# Patient Record
Sex: Male | Born: 1940
Health system: Southern US, Community
[De-identification: ages and names within clinical notes are randomized; demographics above are authoritative.]

## PROBLEM LIST (undated history)

## (undated) DIAGNOSIS — C801 Malignant (primary) neoplasm, unspecified: Secondary | ICD-10-CM

## (undated) DIAGNOSIS — Z9089 Acquired absence of other organs: Secondary | ICD-10-CM

## (undated) DIAGNOSIS — N400 Enlarged prostate without lower urinary tract symptoms: Secondary | ICD-10-CM

## (undated) DIAGNOSIS — L719 Rosacea, unspecified: Secondary | ICD-10-CM

## (undated) DIAGNOSIS — K219 Gastro-esophageal reflux disease without esophagitis: Secondary | ICD-10-CM

## (undated) DIAGNOSIS — E785 Hyperlipidemia, unspecified: Secondary | ICD-10-CM

## (undated) HISTORY — DX: Hyperlipidemia, unspecified: E78.5

## (undated) HISTORY — DX: Benign prostatic hyperplasia without lower urinary tract symptoms: N40.0

## (undated) HISTORY — DX: Rosacea, unspecified: L71.9

## (undated) HISTORY — DX: Acquired absence of other organs: Z90.89

## (undated) HISTORY — DX: Gastro-esophageal reflux disease without esophagitis: K21.9

## (undated) HISTORY — PX: KNEE CARTILAGE SURGERY: SHX688

---
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
## 1990-01-03 HISTORY — PX: VASECTOMY: SHX75

## 1998-05-07 ENCOUNTER — Other Ambulatory Visit: Admission: RE | Admit: 1998-05-07 | Discharge: 1998-05-07 | Payer: Self-pay | Admitting: Internal Medicine

## 2000-01-04 HISTORY — PX: INCISE AND DRAIN ABCESS: PRO64

## 2004-10-15 ENCOUNTER — Ambulatory Visit: Payer: Self-pay | Admitting: Family Medicine

## 2004-10-18 ENCOUNTER — Ambulatory Visit (HOSPITAL_COMMUNITY): Admission: RE | Admit: 2004-10-18 | Discharge: 2004-10-18 | Payer: Self-pay | Admitting: *Deleted

## 2004-10-18 ENCOUNTER — Ambulatory Visit: Payer: Self-pay | Admitting: Family Medicine

## 2004-10-18 ENCOUNTER — Ambulatory Visit (HOSPITAL_BASED_OUTPATIENT_CLINIC_OR_DEPARTMENT_OTHER): Admission: RE | Admit: 2004-10-18 | Discharge: 2004-10-19 | Payer: Self-pay | Admitting: *Deleted

## 2004-11-05 ENCOUNTER — Ambulatory Visit: Payer: Self-pay | Admitting: Family Medicine

## 2004-12-28 ENCOUNTER — Ambulatory Visit: Payer: Self-pay | Admitting: Family Medicine

## 2005-01-04 ENCOUNTER — Ambulatory Visit: Payer: Self-pay | Admitting: Gastroenterology

## 2005-09-12 ENCOUNTER — Ambulatory Visit: Payer: Self-pay | Admitting: Family Medicine

## 2005-10-12 ENCOUNTER — Ambulatory Visit: Payer: Self-pay | Admitting: Family Medicine

## 2005-10-12 LAB — CONVERTED CEMR LAB
AST: 25 units/L (ref 0–37)
Chloride: 106 meq/L (ref 96–112)
Chol/HDL Ratio, serum: 4.9
Cholesterol: 99 mg/dL (ref 0–200)
Creatinine, Ser: 0.7 mg/dL (ref 0.4–1.5)
Eosinophil percent: 3.1 % (ref 0.0–5.0)
GFR calc non Af Amer: 121 mL/min
Glucose, Bld: 94 mg/dL (ref 70–99)
HCT: 39.6 % (ref 39.0–52.0)
HDL: 20.2 mg/dL — ABNORMAL LOW (ref >39.0)
MCHC: 33.9 g/dL (ref 30.0–36.0)
MCV: 84.5 fL (ref 78.0–100.0)
Monocytes Absolute: 0.5 10*3/uL (ref 0.2–0.7)
Neutro Abs: 2.7 10*3/uL (ref 1.4–7.7)
RBC: 4.69 M/uL (ref 4.22–5.81)
Sodium: 143 meq/L (ref 135–145)
TSH: 2.21 microintl units/mL (ref 0.35–5.50)
VLDL: 49 mg/dL — ABNORMAL HIGH (ref 0–40)

## 2005-10-19 ENCOUNTER — Ambulatory Visit: Payer: Self-pay | Admitting: Family Medicine

## 2006-03-13 ENCOUNTER — Ambulatory Visit: Payer: Self-pay | Admitting: Family Medicine

## 2006-07-14 ENCOUNTER — Ambulatory Visit: Payer: Self-pay | Admitting: Family Medicine

## 2006-11-29 ENCOUNTER — Ambulatory Visit: Payer: Self-pay | Admitting: Family Medicine

## 2007-03-26 DIAGNOSIS — R319 Hematuria, unspecified: Secondary | ICD-10-CM

## 2007-03-27 ENCOUNTER — Ambulatory Visit: Payer: Self-pay | Admitting: Family Medicine

## 2007-03-27 LAB — CONVERTED CEMR LAB
Bilirubin Urine: NEGATIVE
Blood in Urine, dipstick: NEGATIVE
Ketones, urine, test strip: NEGATIVE
Protein, U semiquant: NEGATIVE
Urobilinogen, UA: NEGATIVE

## 2007-04-17 ENCOUNTER — Encounter: Payer: Self-pay | Admitting: Family Medicine

## 2007-04-18 ENCOUNTER — Ambulatory Visit: Payer: Self-pay | Admitting: Family Medicine

## 2007-04-18 DIAGNOSIS — N401 Enlarged prostate with lower urinary tract symptoms: Secondary | ICD-10-CM

## 2007-04-18 DIAGNOSIS — L719 Rosacea, unspecified: Secondary | ICD-10-CM

## 2007-04-18 DIAGNOSIS — K210 Gastro-esophageal reflux disease with esophagitis: Secondary | ICD-10-CM

## 2007-04-18 DIAGNOSIS — E785 Hyperlipidemia, unspecified: Secondary | ICD-10-CM

## 2007-04-18 DIAGNOSIS — R351 Nocturia: Secondary | ICD-10-CM

## 2007-04-18 LAB — CONVERTED CEMR LAB
Albumin: 4.3 g/dL (ref 3.5–5.2)
Bilirubin Urine: NEGATIVE
Bilirubin, Direct: 0.1 mg/dL (ref 0.0–0.3)
Blood in Urine, dipstick: NEGATIVE
Calcium: 9.4 mg/dL (ref 8.4–10.5)
Eosinophils Absolute: 0.1 10*3/uL (ref 0.0–0.7)
GFR calc Af Amer: 146 mL/min
GFR calc non Af Amer: 120 mL/min
Glucose, Bld: 95 mg/dL (ref 70–99)
HCT: 39.4 % (ref 39.0–52.0)
HDL: 21.2 mg/dL — ABNORMAL LOW (ref >39.0)
Hemoglobin: 13.6 g/dL (ref 13.0–17.0)
Ketones, urine, test strip: NEGATIVE
MCHC: 34.4 g/dL (ref 30.0–36.0)
MCV: 83.7 fL (ref 78.0–100.0)
Monocytes Absolute: 0.5 10*3/uL (ref 0.1–1.0)
Monocytes Relative: 9.5 % (ref 3.0–12.0)
Neutro Abs: 3 10*3/uL (ref 1.4–7.7)
PSA: 2.2 ng/mL (ref 0.10–4.00)
Protein, U semiquant: NEGATIVE
RDW: 13.2 % (ref 11.5–14.6)
Sodium: 140 meq/L (ref 135–145)
TSH: 1.66 microintl units/mL (ref 0.35–5.50)
Total Protein: 7.3 g/dL (ref 6.0–8.3)
Triglycerides: 248 mg/dL (ref 0–149)
Urobilinogen, UA: NEGATIVE

## 2007-07-16 ENCOUNTER — Telehealth: Payer: Self-pay | Admitting: *Deleted

## 2007-08-20 ENCOUNTER — Telehealth: Payer: Self-pay | Admitting: Family Medicine

## 2007-09-14 DIAGNOSIS — N410 Acute prostatitis: Secondary | ICD-10-CM | POA: Insufficient documentation

## 2007-09-19 ENCOUNTER — Ambulatory Visit: Payer: Self-pay | Admitting: Family Medicine

## 2007-09-19 DIAGNOSIS — R109 Unspecified abdominal pain: Secondary | ICD-10-CM

## 2007-09-19 LAB — CONVERTED CEMR LAB
Ketones, urine, test strip: NEGATIVE
Nitrite: NEGATIVE
Specific Gravity, Urine: 1.02
WBC Urine, dipstick: NEGATIVE
pH: 6

## 2007-10-02 ENCOUNTER — Telehealth: Payer: Self-pay | Admitting: Family Medicine

## 2008-04-03 ENCOUNTER — Ambulatory Visit: Payer: Self-pay | Admitting: Internal Medicine

## 2008-04-03 DIAGNOSIS — M79609 Pain in unspecified limb: Secondary | ICD-10-CM | POA: Insufficient documentation

## 2008-06-19 ENCOUNTER — Ambulatory Visit: Payer: Self-pay | Admitting: Family Medicine

## 2008-06-19 DIAGNOSIS — M8540 Solitary bone cyst, unspecified site: Secondary | ICD-10-CM | POA: Insufficient documentation

## 2008-06-19 LAB — CONVERTED CEMR LAB
Cholesterol, target level: 200 mg/dL
HDL goal, serum: 40 mg/dL
LDL Goal: 130 mg/dL

## 2008-06-26 ENCOUNTER — Encounter (INDEPENDENT_AMBULATORY_CARE_PROVIDER_SITE_OTHER): Payer: Self-pay | Admitting: *Deleted

## 2008-08-05 ENCOUNTER — Ambulatory Visit: Payer: Self-pay | Admitting: Family Medicine

## 2008-08-07 ENCOUNTER — Telehealth: Payer: Self-pay | Admitting: Family Medicine

## 2009-03-18 ENCOUNTER — Telehealth: Payer: Self-pay | Admitting: Family Medicine

## 2009-04-16 ENCOUNTER — Ambulatory Visit: Payer: Self-pay | Admitting: Family Medicine

## 2009-04-16 DIAGNOSIS — N4289 Other specified disorders of prostate: Secondary | ICD-10-CM | POA: Insufficient documentation

## 2009-04-16 LAB — CONVERTED CEMR LAB
Bilirubin Urine: NEGATIVE
Blood in Urine, dipstick: NEGATIVE
Glucose, Urine, Semiquant: NEGATIVE
Ketones, urine, test strip: NEGATIVE
Urobilinogen, UA: 0.2

## 2009-04-23 ENCOUNTER — Telehealth: Payer: Self-pay | Admitting: Family Medicine

## 2009-06-15 ENCOUNTER — Telehealth: Payer: Self-pay | Admitting: *Deleted

## 2009-06-16 ENCOUNTER — Encounter: Payer: Self-pay | Admitting: Family Medicine

## 2009-08-20 ENCOUNTER — Ambulatory Visit: Payer: Self-pay | Admitting: Family Medicine

## 2009-12-29 ENCOUNTER — Telehealth: Payer: Self-pay | Admitting: Internal Medicine

## 2010-01-12 ENCOUNTER — Ambulatory Visit
Admission: RE | Admit: 2010-01-12 | Discharge: 2010-01-12 | Payer: Self-pay | Source: Home / Self Care | Attending: Family Medicine | Admitting: Family Medicine

## 2010-01-12 DIAGNOSIS — H538 Other visual disturbances: Secondary | ICD-10-CM | POA: Insufficient documentation

## 2010-01-31 LAB — CONVERTED CEMR LAB
ALT: 23 units/L (ref 0–53)
ALT: 30 units/L (ref 0–53)
AST: 20 units/L (ref 0–37)
AST: 20 units/L (ref 0–37)
Alkaline Phosphatase: 94 units/L (ref 39–117)
BUN: 12 mg/dL (ref 6–23)
Basophils Absolute: 0 10*3/uL (ref 0.0–0.1)
Bilirubin Urine: NEGATIVE
Bilirubin, Direct: 0.2 mg/dL (ref 0.0–0.3)
Blood in Urine, dipstick: NEGATIVE
Blood in Urine, dipstick: NEGATIVE
Cholesterol: 113 mg/dL (ref 0–200)
Creatinine, Ser: 0.7 mg/dL (ref 0.4–1.5)
Eosinophils Relative: 2.9 % (ref 0.0–5.0)
Eosinophils Relative: 3 % (ref 0.0–5.0)
GFR calc non Af Amer: 115.33 mL/min (ref >60)
GFR calc non Af Amer: 119.51 mL/min (ref >60)
Glucose, Bld: 85 mg/dL (ref 70–99)
Glucose, Urine, Semiquant: NEGATIVE
HCT: 37.5 % — ABNORMAL LOW (ref 39.0–52.0)
HDL: 20.1 mg/dL — ABNORMAL LOW (ref >39.00)
HDL: 25 mg/dL — ABNORMAL LOW (ref >39.00)
Hemoglobin: 13.4 g/dL (ref 13.0–17.0)
Ketones, urine, test strip: NEGATIVE
Lymphs Abs: 1.5 10*3/uL (ref 0.7–4.0)
Lymphs Abs: 1.6 10*3/uL (ref 0.7–4.0)
Monocytes Absolute: 0.5 10*3/uL (ref 0.1–1.0)
Monocytes Relative: 10.5 % (ref 3.0–12.0)
Monocytes Relative: 10.9 % (ref 3.0–12.0)
Neutro Abs: 2.5 10*3/uL (ref 1.4–7.7)
Neutrophils Relative %: 54.1 % (ref 43.0–77.0)
Nitrite: NEGATIVE
PSA: 2.54 ng/mL (ref 0.10–4.00)
Platelets: 129 10*3/uL — ABNORMAL LOW (ref 150.0–400.0)
Platelets: 135 10*3/uL — ABNORMAL LOW (ref 150.0–400.0)
Potassium: 4.7 meq/L (ref 3.5–5.1)
Protein, U semiquant: NEGATIVE
Protein, U semiquant: NEGATIVE
RDW: 14.3 % (ref 11.5–14.6)
Sodium: 142 meq/L (ref 135–145)
Specific Gravity, Urine: 1.025
TSH: 1.51 microintl units/mL (ref 0.35–5.50)
TSH: 2.55 microintl units/mL (ref 0.35–5.50)
Total Bilirubin: 1.1 mg/dL (ref 0.3–1.2)
Total Bilirubin: 1.3 mg/dL — ABNORMAL HIGH (ref 0.3–1.2)
Triglycerides: 398 mg/dL — ABNORMAL HIGH (ref 0.0–149.0)
VLDL: 58.6 mg/dL — ABNORMAL HIGH (ref 0.0–40.0)
VLDL: 79.6 mg/dL — ABNORMAL HIGH (ref 0.0–40.0)
WBC Urine, dipstick: NEGATIVE
WBC: 4.6 10*3/uL (ref 4.5–10.5)
WBC: 5 10*3/uL (ref 4.5–10.5)
pH: 5.5

## 2010-02-02 NOTE — Progress Notes (Signed)
Summary: rx change  Phone Note From Pharmacy   Caller: [facility]* Summary of Call: patient would like to get plain ditropan instead of XL.  If this is okay the directions would the directions read three times a day or four times a day? Initial call taken by: Kern Reap CMA Duncan Dull),  June 15, 2009 4:01 PM  Follow-up for Phone Call        would start with Ditropan 2.5 mg b.i.d., increase to t.i.d. this is still symptomatic after 3 months dispensed to refills x 4 Follow-up by: Roderick Pee MD,  June 15, 2009 6:13 PM  Additional Follow-up for Phone Call Additional follow up Details #1::        Spoke with Pharmist changed to Ditropan to 2.5 mg,  1 tab 2 x a day Additional Follow-up by: Kathrynn Speed CMA,  June 16, 2009 11:14 AM

## 2010-02-02 NOTE — Progress Notes (Signed)
Summary: abdominal soreness  Phone Note Call from Patient   Summary of Call: [phone] Pt. went to the gym and worked out last night.   Is very sore in his lower abdomen and prostate today.  Suggestions???  No fever, or UTI symptoms.  Taking Cipro. Initial call taken by: Lynann Beaver CMA,  April 23, 2009 10:07 AM  Follow-up for Phone Call        Tylenol two tabs 4 times a day as needed Follow-up by: Roderick Pee MD,  April 23, 2009 11:43 AM  Additional Follow-up for Phone Call Additional follow up Details #1::        Pt notified. Additional Follow-up by: Lynann Beaver CMA,  April 23, 2009 1:55 PM

## 2010-02-02 NOTE — Miscellaneous (Signed)
Summary: Ditropan change  Clinical Lists Changes

## 2010-02-02 NOTE — Progress Notes (Signed)
Summary: Pt req cheaper alternative to Ditropan and Vesicare  Phone Note Call from Patient Call back at Home Phone [phone]   Caller: Patient Summary of Call: Pt called and said that Ditropan XL is not on pts insurance plan and Vesicare is on plan,but its $40 also. Pt needing a cheaper alternatives. Please call in to [facility] Initial call taken by: Lucy Antigua,  March 18, 2009 11:01 AM  Follow-up for Phone Call        Phone Call Completed Follow-up by: Kern Reap CMA Duncan Dull),  March 18, 2009 11:17 AM

## 2010-02-02 NOTE — Assessment & Plan Note (Signed)
Summary: PROSTATE CONCERNS // RS   Vital Signs:  Patient profile:   70 year old male Height:      71 inches Weight:      231 pounds BMI:     32.33 Temp:     98.2 degrees F oral BP sitting:   132 / 62  (left arm) Cuff size:   regular  Vitals Entered By: Kern Reap CMA Duncan Dull) (April 16, 2009 11:35 AM) CC: prostate flare up Is Patient Diabetic? No Pain Assessment Patient in pain? yes        CC:  prostate flare up.  History of Present Illness: Matthew House is a 70 year old, married male, nonsmoker, who comes in today for evaluation.  The prostatodynia.  The couple weeks ago.  He drove for about 7 hours in the car and at the end of the drive noticed pain in his prostate gland.  He's had no fever, chills, urinary tract symptoms, et Karie Soda.  Allergies: 1)  ! Penicillin  Past History:  Past medical, surgical, family and social histories (including risk factors) reviewed for relevance to current acute and chronic problems.  Past Medical History: Reviewed history from 11/29/2006 and no changes required. gerd rosacea hyperlipidemia BPH T/A septoplasty 1993I  Family History: Reviewed history and no changes required.  Social History: Reviewed history from 03/27/2007 and no changes required. Occupation: former Emergency planning/management officer Married Never Smoked Alcohol use-yes Drug use-no Regular exercise-yes  Review of Systems      See HPI  Physical Exam  General:  Well-developed,well-nourished,in no acute distress; alert,appropriate and cooperative throughout examination   Impression & Recommendations:  Problem # 1:  PROSTATORRHEA (ICD-602.8) Assessment New  Complete Medication List: 1)  Tetracycline Hcl 500 Mg Caps (Tetracycline hcl) .... Take one tablet twice daily 2)  Zantac 150 Mg Tabs (Ranitidine hcl) .... Take one tablet in am and one tablet in pm 3)  Ditropan Xl 5 Mg Xr24h-tab (Oxybutynin chloride) .... One tab daily 4)  Aspir-low 81 Mg Tbec (Aspirin) .... Once  daily 5)  Saw Palmetto Complex Caps (Zn-pyg afri-nettle-saw palmet) .... Take one tab once daily 6)  Viagra 50 Mg Tabs (Sildenafil citrate) .... Uad 7)  Simvastatin 40 Mg Tabs (Simvastatin) .Marland Kitchen.. 1 tab @ bedtime 8)  Ciprofloxacin Hcl 500 Mg Tabs (Ciprofloxacin hcl) .... Take 1 tablet by mouth two times a day  Other Orders: UA Dipstick w/o Micro (manual) ([address])  Patient Instructions: 1)  takes 600 mg of Motrin twice a day with food or one Aleve twice a day.  Begin Cipro one twice a day for 3 weeks.  If after 3 weeks of Cipro.  The symptoms do not resolve, then I would recommend a urologic consult with Dr. Saul Fordyce. Prescriptions: CIPROFLOXACIN HCL 500 MG TABS (CIPROFLOXACIN HCL) Take 1 tablet by mouth two times a day  #50 x 1   Entered and Authorized by:   Roderick Pee MD   Signed by:   Roderick Pee MD on 04/16/2009   Method used:   Electronically to        [facility]* (retail)       [address]       [place], [place]  [address]       Ph: [phone]       Fax: [phone]   RxID:   [id number] SIMVASTATIN 40 MG TABS (SIMVASTATIN) 1 tab @ bedtime  #100 x 3   Entered and Authorized by:   Roderick Pee MD   Signed  by:   Roderick Pee MD on 04/16/2009   Method used:   Electronically to        [facility]* (retail)       [address]       [place], [place]  [address]       Ph: [phone]       Fax: [phone]   RxID:   [id number]   Laboratory Results   Urine Tests  Date/Time Received: April 16, 2009   Routine Urinalysis   Color: yellow Appearance: Clear Glucose: negative   (Normal Range: Negative) Bilirubin: negative   (Normal Range: Negative) Ketone: negative   (Normal Range: Negative) Spec. Gravity: 1.015   (Normal Range: 1.003-1.035) Blood: negative   (Normal Range: Negative) pH: 7.5   (Normal Range: 5.0-8.0) Protein: trace   (Normal Range: Negative) Urobilinogen: 0.2   (Normal Range: 0-1) Nitrite: negative    (Normal Range: Negative) Leukocyte Esterace: negative   (Normal Range: Negative)    Comments: Kern Reap CMA Duncan Dull)  April 16, 2009 11:57 AM

## 2010-02-02 NOTE — Assessment & Plan Note (Signed)
Summary: CPX/PT FASTING/RCD   Vital Signs:  Patient profile:   70 year old male Height:      70.25 inches Weight:      231 pounds Temp:     98 degrees F oral BP sitting:   130 / 78  (left arm) Cuff size:   regular  Vitals Entered By: Kern Reap CMA Duncan Dull) (August 20, 2009 8:19 AM) CC: wellness exam Is Patient Diabetic? Yes   CC:  wellness exam.  History of Present Illness: Matthew House is a 70 year old, married male, nonsmoker, who comes in today for general physical examination and for evaluation of acne rosacea, reflux, esophagitis, erectile dysfunction, hyperlipidemia, BPH.  He takes tetracycline, 500 mg daily for the rosacea, Zantac, 150 mg b.i.d. for the reflux, Viagra 50 mg p.r.n. for recti dysfunction, simvastatin 40 mg nightly for hyperlipidemia, oxybutynin, 2 1/2 milligrams.  B.i.d. for BPH, and 181 mg, baby aspirin, and saw palmetto.  He gets routine eye care.  Dental care.  Colonoscopy done 5 years ago, normal, tetanus, 2002, Pneumovax 2010, declines a seasonal flu shot. Here for Medicare AWV:  1.   Risk factors based on Past M, S, F history:...reviewed in detail, and changed 2.   Physical Activities: walks10  miles a day 3.   Depression/mood: .......mood good.  No depression 4.   Hearing: normal 5.   ADL's: reviewed normal 6.   Fall Risk: reviewed.  No change 7.   Home Safety: reviewed.  No change 8.   Height, weight, &visual acuity:height weight, normal vision normal 9.   Counseling: continue current treatment programs 10.   Labs ordered based on risk factors: done today 11.           Referral Coordination.........none indicated 12.           Care Plan......Marland Kitchenreviewed all medications 13.            Cognitive Assessment.........normal mentation   Allergies: 1)  ! Penicillin  Past History:  Past medical, surgical, family and social histories (including risk factors) reviewed, and no changes noted (except as noted below).  Past Medical History: Reviewed history  from 11/29/2006 and no changes required. gerd rosacea hyperlipidemia BPH T/A septoplasty 1993I  Family History: Reviewed history and no changes required.  Social History: Reviewed history from 03/27/2007 and no changes required. Occupation: former Emergency planning/management officer Married Never Smoked Alcohol use-yes Drug use-no Regular exercise-yes  Review of Systems      See HPI  Physical Exam  General:  Well-developed,well-nourished,in no acute distress; alert,appropriate and cooperative throughout examination Head:  Normocephalic and atraumatic without obvious abnormalities. No apparent alopecia or balding. Eyes:  No corneal or conjunctival inflammation noted. EOMI. Perrla. Funduscopic exam benign, without hemorrhages, exudates or papilledema. Vision grossly normal. Ears:  External ear exam shows no significant lesions or deformities.  Otoscopic examination reveals clear canals, tympanic membranes are intact bilaterally without bulging, retraction, inflammation or discharge. Hearing is grossly normal bilaterally. Nose:  External nasal examination shows no deformity or inflammation. Nasal mucosa are pink and moist without lesions or exudates. Mouth:  Oral mucosa and oropharynx without lesions or exudates.  Teeth in good repair. Neck:  No deformities, masses, or tenderness noted. Chest Wall:  No deformities, masses, tenderness or gynecomastia noted. Breasts:  No masses or gynecomastia noted Lungs:  Normal respiratory effort, chest expands symmetrically. Lungs are clear to auscultation, no crackles or wheezes. Heart:  Normal rate and regular rhythm. S1 and S2 normal without gallop, murmur, click, rub or other extra  sounds. Abdomen:  Bowel sounds positive,abdomen soft and non-tender without masses, organomegaly or hernias noted. Rectal:  No external abnormalities noted. Normal sphincter tone. No rectal masses or tenderness. Genitalia:  Testes bilaterally descended without nodularity, tenderness or  masses. No scrotal masses or lesions. No penis lesions or urethral discharge. Prostate:  no nodules, no asymmetry, and 1+ enlarged.   Msk:  No deformity or scoliosis noted of thoracic or lumbar spine.   Pulses:  R and L carotid,radial,femoral,dorsalis pedis and posterior tibial pulses are full and equal bilaterally Extremities:  No clubbing, cyanosis, edema, or deformity noted with normal full range of motion of all joints.   Neurologic:  No cranial nerve deficits noted. Station and gait are normal. Plantar reflexes are down-going bilaterally. DTRs are symmetrical throughout. Sensory, motor and coordinative functions appear intact. Skin:  Intact without suspicious lesions or rashes Cervical Nodes:  No lymphadenopathy noted Axillary Nodes:  No palpable lymphadenopathy Inguinal Nodes:  No significant adenopathy Psych:  Cognition and judgment appear intact. Alert and cooperative with normal attention span and concentration. No apparent delusions, illusions, hallucinations   Impression & Recommendations:  Problem # 1:  ACNE, ROSACEA (ICD-695.3) Assessment Improved  Orders: Venipuncture (09811) Prescription Created Electronically [phone]) Medicare -1st Annual Wellness Visit [phone]) Urinalysis-dipstick only (Medicare patient) (13086VH) TLB-Lipid Panel (80061-LIPID) TLB-BMP (Basic Metabolic Panel-BMET) (80048-METABOL) TLB-CBC Platelet - w/Differential (85025-CBCD) TLB-Hepatic/Liver Function Pnl (80076-HEPATIC) TLB-TSH (Thyroid Stimulating Hormone) (84443-TSH) TLB-PSA (Prostate Specific Antigen) (84153-PSA)  Problem # 2:  ESOPHAGITIS, REFLUX (ICD-530.11) Assessment: Improved  His updated medication list for this problem includes:    Zantac 150 Mg Tabs (Ranitidine hcl) .Marland Kitchen... Take one tablet in am and one tablet in pm  Orders: Venipuncture (84696) Prescription Created Electronically [phone]) Medicare -1st Annual Wellness Visit [phone]) Urinalysis-dipstick only (Medicare patient)  (40102VO) TLB-Lipid Panel (80061-LIPID) TLB-BMP (Basic Metabolic Panel-BMET) (80048-METABOL) TLB-CBC Platelet - w/Differential (85025-CBCD) TLB-Hepatic/Liver Function Pnl (80076-HEPATIC) TLB-TSH (Thyroid Stimulating Hormone) (84443-TSH) TLB-PSA (Prostate Specific Antigen) (84153-PSA)  Problem # 3:  BENIGN PROSTATIC HYPERTROPHY, WITH OBSTRUCTION (ICD-600.01) Assessment: Improved  Orders: Venipuncture (53664) Prescription Created Electronically [phone]) Medicare -1st Annual Wellness Visit [phone]) Urinalysis-dipstick only (Medicare patient) (63875IE) TLB-Lipid Panel (80061-LIPID) TLB-BMP (Basic Metabolic Panel-BMET) (80048-METABOL) TLB-CBC Platelet - w/Differential (85025-CBCD) TLB-Hepatic/Liver Function Pnl (80076-HEPATIC) TLB-TSH (Thyroid Stimulating Hormone) (84443-TSH) TLB-PSA (Prostate Specific Antigen) (84153-PSA)  Problem # 4:  Preventive Health Care (ICD-V70.0) Assessment: Unchanged  Complete Medication List: 1)  Tetracycline Hcl 500 Mg Caps (Tetracycline hcl) .... Take one tablet twice daily 2)  Zantac 150 Mg Tabs (Ranitidine hcl) .... Take one tablet in am and one tablet in pm 3)  Aspir-low 81 Mg Tbec (Aspirin) .... Once daily 4)  Saw Palmetto Complex Caps (Zn-pyg afri-nettle-saw palmet) .... Take one tab once daily 5)  Viagra 50 Mg Tabs (Sildenafil citrate) .... Uad 6)  Simvastatin 40 Mg Tabs (Simvastatin) .Marland Kitchen.. 1 tab @ bedtime 7)  Oxybutynin Chloride 5 Mg Tabs (Oxybutynin chloride) .... Take 1 tablet by mouth every morning  Other Orders: Specimen Handling (33295) EKG w/ Interpretation (93000)  Patient Instructions: 1)  Please schedule a follow-up appointment in 1 year. 2)  It is important that you exercise regularly at least 20 minutes 5 times a week. If you develop chest pain, have severe difficulty breathing, or feel very tired , stop exercising immediately and seek medical attention. 3)  Schedule a colonoscopy/sigmoidoscopy to help detect colon cancer. 4)  Take an  Aspirin every day. Prescriptions: VIAGRA 50 MG TABS (SILDENAFIL CITRATE) UAD  #6 x 11   Entered  and Authorized by:   Roderick Pee MD   Signed by:   Roderick Pee MD on 08/20/2009   Method used:   Print then Give to Patient   RxID:   [id number] SIMVASTATIN 40 MG TABS (SIMVASTATIN) 1 tab @ bedtime  #100 x 3   Entered and Authorized by:   Roderick Pee MD   Signed by:   Roderick Pee MD on 08/20/2009   Method used:   Electronically to        [facility]* (retail)       [address]       [place], [place]  [address]       Ph: [phone]       Fax: [phone]   RxID:   [id number] VIAGRA 50 MG TABS (SILDENAFIL CITRATE) UAD  #6 x 11   Entered and Authorized by:   Roderick Pee MD   Signed by:   Roderick Pee MD on 08/20/2009   Method used:   Electronically to        [facility]* (retail)       [address]       [place], [place]  [address]       Ph: [phone]       Fax: [phone]   RxID:   [id number] ZANTAC 150 MG  TABS (RANITIDINE HCL) take one tablet in am and one tablet in pm  #200 x 3   Entered and Authorized by:   Roderick Pee MD   Signed by:   Roderick Pee MD on 08/20/2009   Method used:   Electronically to        [facility]* (retail)       [address]       [place], [place]  [address]       Ph: [phone]       Fax: [phone]   RxID:   [id number] TETRACYCLINE HCL 500 MG  CAPS (TETRACYCLINE HCL) take one tablet twice daily  #200 x 3   Entered and Authorized by:   Roderick Pee MD   Signed by:   Roderick Pee MD on 08/20/2009   Method used:   Electronically to        [facility]* (retail)       [address]       [place], [place]  [address]       Ph: [phone]       Fax: [phone]   RxID:   [id number] OXYBUTYNIN CHLORIDE 5 MG TABS (OXYBUTYNIN CHLORIDE) Take 1 tablet by mouth every morning  #100 x 3   Entered and Authorized by:   Roderick Pee  MD   Signed by:   Roderick Pee MD on 08/20/2009   Method used:   Electronically to        [facility]* (retail)       [address]       [place], [place]  [address]       Ph: [phone]       Fax: [phone]   RxID:   [id number]     Laboratory Results   Urine Tests    Routine Urinalysis   Color: yellow Appearance: Clear Glucose: negative   (Normal Range: Negative) Bilirubin: negative   (Normal Range: Negative) Ketone: negative   (Normal Range: Negative) Spec. Gravity: 1.025   (Normal Range: 1.003-1.035) Blood: negative   (Normal Range:  Negative) pH: 5.5   (Normal Range: 5.0-8.0) Protein: negative   (Normal Range: Negative) Urobilinogen: 0.2   (Normal Range: 0-1) Nitrite: negative   (Normal Range: Negative) Leukocyte Esterace: negative   (Normal Range: Negative)    Comments: Rita Ohara  August 20, 2009 9:53 AM

## 2010-02-02 NOTE — Miscellaneous (Signed)
  Clinical Lists Changes  Medications: Removed medication of DITROPAN XL 5 MG  XR24H-TAB (OXYBUTYNIN CHLORIDE) one tab daily Added new medication of OXYBUTYNIN CHLORIDE 5 MG TABS (OXYBUTYNIN CHLORIDE) Take half two times a day

## 2010-02-04 NOTE — Assessment & Plan Note (Signed)
Summary: dizzy/dm   Vital Signs:  Patient profile:   70 year old male BP sitting:   150 / 70  (left arm) Cuff size:   regular  Vitals Entered By: Kern Reap CMA Duncan Dull) (January 12, 2010 4:23 PM)  Contraindications/Deferment of Procedures/Staging:    Test/Procedure: Weight Refused    Reason for deferment: patient declined-cannot calculate BMI  CBG Result 108   History of Present Illness: Matthew House is a 70 year old male, who comes in today for evaluation of an episode of dizziness.  He states and 6 p.m. on Christmas day.  He was driving down the road.  He had his neck turned to the right and noticed some blurred vision, probably turn his head back, and the blurred vision went away.  He had no neurologic symptoms and is not had a problem since that time.  He asked one of his church members about it who recommended he come and be seen for an evaluation.  Review of systems otherwise negative.  Allergies: 1)  ! Penicillin  Past History:  Past medical, surgical, family and social histories (including risk factors) reviewed for relevance to current acute and chronic problems.  Past Medical History: Reviewed history from 11/29/2006 and no changes required. gerd rosacea hyperlipidemia BPH T/A septoplasty 1993I  Family History: Reviewed history and no changes required.  Social History: Reviewed history from 03/27/2007 and no changes required. Occupation: former Emergency planning/management officer Married Never Smoked Alcohol use-yes Drug use-no Regular exercise-yes  Review of Systems      See HPI  Physical Exam  General:  Well-developed,well-nourished,in no acute distress; alert,appropriate and cooperative throughout examination Head:  Normocephalic and atraumatic without obvious abnormalities. No apparent alopecia or balding. Eyes:  No corneal or conjunctival inflammation noted. EOMI. Perrla. Funduscopic exam benign, without hemorrhages, exudates or papilledema. Vision grossly  normal. Ears:  External ear exam shows no significant lesions or deformities.  Otoscopic examination reveals clear canals, tympanic membranes are intact bilaterally without bulging, retraction, inflammation or discharge. Hearing is grossly normal bilaterally. Nose:  External nasal examination shows no deformity or inflammation. Nasal mucosa are pink and moist without lesions or exudates. Mouth:  Oral mucosa and oropharynx without lesions or exudates.  Teeth in good repair. Neck:  No deformities, masses, or tenderness noted. Msk:  No deformity or scoliosis noted of thoracic or lumbar spine.   Pulses:  R and L carotid,radial,femoral,dorsalis pedis and posterior tibial pulses are full and equal bilaterally............no carotid bruits Extremities:  No clubbing, cyanosis, edema, or deformity noted with normal full range of motion of all joints.   Neurologic:  No cranial nerve deficits noted. Station and gait are normal. Plantar reflexes are down-going bilaterally. DTRs are symmetrical throughout. Sensory, motor and coordinative functions appear intact.   Impression & Recommendations:  Problem # 1:  ROUTINE GENERAL MEDICAL EXAM@HEALTH  CARE FACL (ICD-V70.0) Assessment New  Complete Medication List: 1)  Tetracycline Hcl 500 Mg Caps (Tetracycline hcl) .... Take one tablet twice daily 2)  Zantac 150 Mg Tabs (Ranitidine hcl) .... Take one tablet in am and one tablet in pm 3)  Aspir-low 81 Mg Tbec (Aspirin) .... Once daily 4)  Saw Palmetto Complex Caps (Zn-pyg afri-nettle-saw palmet) .... Take one tab once daily 5)  Viagra 50 Mg Tabs (Sildenafil citrate) .... Uad 6)  Simvastatin 40 Mg Tabs (Simvastatin) .Marland Kitchen.. 1 tab @ bedtime 7)  Oxybutynin Chloride 5 Mg Tabs (Oxybutynin chloride) .... Take 1 tablet by mouth every morning 8)  Doxycycline Hyclate 100 Mg Caps (Doxycycline hyclate) .Marland KitchenMarland KitchenMarland Kitchen  Take one tab by mouth once daily  Other Orders: Capillary Blood Glucose/CBG (01027)  Patient Instructions: 1)   [place] ophthalmologist, for an ophthalmologic evaluation, if that's normal and you otherwise feel well, but I would do nothing further is however, you would like a second opinion and was scheduled to see a neurologist   Orders Added: 1)  Capillary Blood Glucose/CBG [82948] 2)  Est. Patient Level IV [25366]

## 2010-02-04 NOTE — Progress Notes (Signed)
Summary: new rx  Phone Note Refill Request Message from:  Fax from Pharmacy on December 29, 2009 5:34 PM  Refills Requested: Medication #1:  TETRACYCLINE HCL 500 MG  CAPS take one tablet twice daily Initial call taken by: Kern Reap CMA Duncan Dull),  December 29, 2009 5:34 PM  Follow-up for Phone Call        tetracyline is on backorder.  please send a repllacement med.  [place]. Follow-up by: Kern Reap CMA Duncan Dull),  December 29, 2009 5:34 PM    New/Updated Medications: DOXYCYCLINE HYCLATE 100 MG CAPS (DOXYCYCLINE HYCLATE) take one tab by mouth once daily Prescriptions: DOXYCYCLINE HYCLATE 100 MG CAPS (DOXYCYCLINE HYCLATE) take one tab by mouth once daily  #30 x 6   Entered by:   Kern Reap CMA (AAMA)   Authorized by:   Gordy Savers  MD   Signed by:   Kern Reap CMA (AAMA) on 12/31/2009   Method used:   Electronically to        [facility]* (retail)       [address]       [place], [place]  [address]       Ph: [phone]       Fax: [phone]   RxID:   [id number]  doxycycline 100  #30 one daily

## 2010-05-21 NOTE — Op Note (Signed)
Matthew House, Matthew House               ACCOUNT NO.:  [id number]   MEDICAL RECORD NO.:  [id number]          PATIENT TYPE:  AMB   LOCATION:  DSC                          FACILITY:  [facility]   PHYSICIAN:  Lowell Bouton, M.D.DATE OF BIRTH:  05/04/1940   DATE OF PROCEDURE:  10/18/2004  DATE OF DISCHARGE:                                 OPERATIVE REPORT   PREOP DIAGNOSIS:  Septic arthritis, right long DIP joint.   POSTOP DIAGNOSIS:  Septic arthritis, right long DIP joint.   PROCEDURE:  Incision and drainage of DIP joint, right long finger.   SURGEON:  Lowell Bouton, M.D.   ANESTHESIA:  General.   OPERATIVE FINDINGS:  The patient had gross purulent material in his DIP  joint. He had arthritic changes present.   PROCEDURE:  Under general anesthesia with a tourniquet on the right arm, the  right hand was prepped and draped in usual fashion after elevating the limb.  The tourniquet was inflated to 250 mmHg. A Y-shaped incision was made over  the dorsum of the DIP joint of the right long finger. Bleeding points were  coagulated. Sharp dissection was carried down to the extensor tendon which  was preserved. A longitudinal incision was made on the ulnar side of the  joint just dorsal to the collateral ligament. Gross purulent material was  obtained. Cultures were sent to lab. The joint was then irrigated with  saline and packed with Iodoform packing.  The wound was loosely closed with  4-0 nylon sutures. Sterile dressings were applied and the patient was given  half percent Marcaine digital block for pain control.  He went to the  recovery room awake and stable condition.      Lowell Bouton, M.D.  Electronically Signed     EMM/MEDQ  D:  10/18/2004  T:  10/18/2004  Job:  [id number]   cc:   Tinnie Gens A. Tawanna Cooler, M.D. [facility]  [address] [place]  [place] [address]

## 2010-06-09 ENCOUNTER — Telehealth: Payer: Self-pay | Admitting: Family Medicine

## 2010-06-09 NOTE — Telephone Encounter (Signed)
Triage vm--------been on Doxycycline on 6 months. Having bouts of diarrhea. According to the pharmacist, it is a rare side effect. Used to be Tetracycline. Having also dark urine. Very concerned.

## 2010-06-09 NOTE — Telephone Encounter (Signed)
Spoke with patient and appointment made

## 2010-06-14 ENCOUNTER — Encounter: Payer: Self-pay | Admitting: Family Medicine

## 2010-06-15 ENCOUNTER — Encounter: Payer: Self-pay | Admitting: Internal Medicine

## 2010-06-15 ENCOUNTER — Ambulatory Visit (INDEPENDENT_AMBULATORY_CARE_PROVIDER_SITE_OTHER): Payer: Medicare HMO | Admitting: Internal Medicine

## 2010-06-15 DIAGNOSIS — R82998 Other abnormal findings in urine: Secondary | ICD-10-CM

## 2010-06-15 DIAGNOSIS — R319 Hematuria, unspecified: Secondary | ICD-10-CM

## 2010-06-15 DIAGNOSIS — M545 Low back pain: Secondary | ICD-10-CM

## 2010-06-15 LAB — POCT URINALYSIS DIPSTICK
Ketones, UA: NEGATIVE
Leukocytes, UA: NEGATIVE
pH, UA: 6

## 2010-06-15 NOTE — Patient Instructions (Signed)
Liberalize your fluid intake  Call or return to clinic prn if these symptoms worsen or fail to improve as anticipated.

## 2010-06-15 NOTE — Progress Notes (Signed)
  Subjective:    Patient ID: Matthew House, male    DOB: 05-Feb-1940, 70 y.o.   MRN: [id number]  HPI 70 year old patient who is seen today with concerns of dark urine. This occurs intermittently and is improved with the more liberal fluid intake. He also describes some mild intermittent right lower back discomfort. His problem list includes a history of hematuria in the past. A urinalysis was reviewed today and revealed no hematuria. He states he feels his urine has also been darker today. No GU symptoms no new medications or change in his diet   Review of Systems  Gastrointestinal: Negative.   Genitourinary: Positive for flank pain. Negative for dysuria and difficulty urinating.       Objective:   Physical Exam  Constitutional: He appears well-developed and well-nourished. No distress.       No acute distress. Blood pressure 128/80  Abdominal: Soft. Bowel sounds are normal. He exhibits no distension. There is no tenderness.       No CVA tenderness          Assessment & Plan:   Dark urine. No evidence of hematuria. Suspect his right low back pain is more musculoligamentous. He has been told to increase his fluid intake and will clinically be observed. He is scheduled for a physical in September he will call if any symptoms are worse in

## 2010-08-05 ENCOUNTER — Other Ambulatory Visit: Payer: Self-pay | Admitting: Family Medicine

## 2010-08-05 NOTE — Telephone Encounter (Signed)
ok 

## 2010-08-05 NOTE — Telephone Encounter (Signed)
Is this okay to fill? 

## 2010-08-20 ENCOUNTER — Ambulatory Visit (INDEPENDENT_AMBULATORY_CARE_PROVIDER_SITE_OTHER): Payer: Medicare HMO | Admitting: Family Medicine

## 2010-08-20 ENCOUNTER — Encounter: Payer: Self-pay | Admitting: Family Medicine

## 2010-08-20 VITALS — BP 126/72 | HR 83 | Temp 98.3°F | Wt 238.0 lb

## 2010-08-20 DIAGNOSIS — K589 Irritable bowel syndrome without diarrhea: Secondary | ICD-10-CM

## 2010-08-20 DIAGNOSIS — K219 Gastro-esophageal reflux disease without esophagitis: Secondary | ICD-10-CM

## 2010-08-24 NOTE — Progress Notes (Signed)
  Subjective:    Patient ID: Matthew House, male    DOB: 21-Sep-1940, 70 y.o.   MRN: [id number]  HPI Here for 4 days of light diarrhea with no fever or cramps. He often cycles between having loose stool and being constipated, and this has gone on for sveral years. He is also bloated a lot and passes a lot of flatus. He has some heartburn at times which is controlled with Zantac. No nausea.    Review of Systems  Constitutional: Negative.   Respiratory: Negative.   Cardiovascular: Negative.   Gastrointestinal: Positive for diarrhea and constipation. Negative for nausea, vomiting, abdominal pain, blood in stool, abdominal distention and anal bleeding.       Objective:   Physical Exam  Constitutional: He appears well-developed and well-nourished.  Abdominal: Soft. Bowel sounds are normal. He exhibits no distension and no mass. There is no tenderness. There is no rebound and no guarding.          Assessment & Plan:  He has a combination of GERD and IBS. I gave him some info to read about IBS. Limit use of caffeine and alcohol. Try Miralax daily as well as an Librarian, academic daily.

## 2010-08-31 ENCOUNTER — Encounter: Payer: Self-pay | Admitting: Internal Medicine

## 2010-09-05 ENCOUNTER — Other Ambulatory Visit: Payer: Self-pay | Admitting: Family Medicine

## 2010-09-05 ENCOUNTER — Other Ambulatory Visit: Payer: Self-pay | Admitting: Internal Medicine

## 2010-09-07 NOTE — Telephone Encounter (Signed)
Please advise 

## 2010-09-08 ENCOUNTER — Other Ambulatory Visit: Payer: Self-pay | Admitting: Family Medicine

## 2010-09-14 ENCOUNTER — Ambulatory Visit (AMBULATORY_SURGERY_CENTER): Payer: Medicare HMO | Admitting: *Deleted

## 2010-09-14 ENCOUNTER — Encounter: Payer: Self-pay | Admitting: Internal Medicine

## 2010-09-14 VITALS — Ht 71.0 in | Wt 240.7 lb

## 2010-09-14 DIAGNOSIS — Z1211 Encounter for screening for malignant neoplasm of colon: Secondary | ICD-10-CM

## 2010-09-14 MED ORDER — SUPREP BOWEL PREP KIT 17.5-3.13-1.6 GM/177ML PO SOLN: 1.0000 | Freq: Once | ORAL | Status: DC

## 2010-09-28 ENCOUNTER — Ambulatory Visit (AMBULATORY_SURGERY_CENTER): Payer: Medicare HMO | Admitting: Internal Medicine

## 2010-09-28 ENCOUNTER — Encounter: Payer: Self-pay | Admitting: Internal Medicine

## 2010-09-28 VITALS — BP 134/72 | HR 68 | Temp 97.1°F | Resp 15 | Ht 71.0 in | Wt 240.7 lb

## 2010-09-28 DIAGNOSIS — Z1211 Encounter for screening for malignant neoplasm of colon: Secondary | ICD-10-CM

## 2010-09-28 DIAGNOSIS — Z86010 Personal history of colonic polyps: Secondary | ICD-10-CM

## 2010-09-28 HISTORY — PX: COLONOSCOPY: SHX174

## 2010-09-28 MED ORDER — SODIUM CHLORIDE 0.9 % IV SOLN: 500.0000 mL | INTRAVENOUS | Status: DC

## 2010-09-28 NOTE — Patient Instructions (Signed)
Discharge instructions given with verbal understanding. Handouts on diverticulosis and a high fiber diet given. Resume previous medications. 

## 2010-09-29 ENCOUNTER — Telehealth: Payer: Self-pay | Admitting: *Deleted

## 2010-09-29 NOTE — Telephone Encounter (Signed)
Left mess on voicemail with id.

## 2010-10-05 ENCOUNTER — Other Ambulatory Visit: Payer: Self-pay | Admitting: Family Medicine

## 2010-11-23 ENCOUNTER — Ambulatory Visit (INDEPENDENT_AMBULATORY_CARE_PROVIDER_SITE_OTHER): Payer: Medicare HMO | Admitting: Family Medicine

## 2010-11-23 ENCOUNTER — Encounter: Payer: Self-pay | Admitting: Family Medicine

## 2010-11-23 ENCOUNTER — Ambulatory Visit: Payer: Medicare HMO | Admitting: Family Medicine

## 2010-11-23 DIAGNOSIS — L723 Sebaceous cyst: Secondary | ICD-10-CM | POA: Insufficient documentation

## 2010-11-23 NOTE — Patient Instructions (Signed)
Clean the area twice daily with peroxide and apply antibiotic ointment.  Return p.r.n.

## 2010-11-23 NOTE — Progress Notes (Signed)
  Subjective:    Patient ID: Matthew House, male    DOB: 01/11/1940, 70 y.o.   MRN: [id number]  HPI Chuck is a 70 year old, married man, nonsmoker, who comes in today for evaluation of a lesion on the end of his nose.  This been irritated.     Review of Systems    General and dermatologic review of systems otherwise negative except for a history of acne rosacea Objective:   Physical Exam  Well developed, well nourished man in no acute distress.  Examination shows a sebaceous cyst that is impacted with cerumen.  The area was cleaned with alcohol.  The cyst was excised with a 18 needle.  He tolerated the procedure without anesthesia.  No complications      Assessment & Plan:  Sebaceous cyst to resolve with I&D.  Return p.r.n.

## 2010-12-09 ENCOUNTER — Ambulatory Visit: Payer: Medicare HMO | Admitting: Family Medicine

## 2011-01-27 ENCOUNTER — Other Ambulatory Visit: Payer: Self-pay | Admitting: Family Medicine

## 2011-02-10 ENCOUNTER — Telehealth: Payer: Self-pay | Admitting: Family Medicine

## 2011-02-10 NOTE — Telephone Encounter (Signed)
Patient will try the OTC a little longer and will call back if no improvement

## 2011-02-10 NOTE — Telephone Encounter (Signed)
Pt called and has questions re: acid reflux. Pt ate a lot of spicy food a few days ago and is still tasting the acid every time pt coughs. Pt req call back from nurse.

## 2011-02-11 ENCOUNTER — Ambulatory Visit (INDEPENDENT_AMBULATORY_CARE_PROVIDER_SITE_OTHER): Payer: Medicare Other | Admitting: Family Medicine

## 2011-02-11 ENCOUNTER — Encounter: Payer: Self-pay | Admitting: Family Medicine

## 2011-02-11 VITALS — BP 122/78 | HR 67 | Temp 97.7°F | Wt 234.0 lb

## 2011-02-11 DIAGNOSIS — IMO0002 Reserved for concepts with insufficient information to code with codable children: Secondary | ICD-10-CM

## 2011-02-11 NOTE — Progress Notes (Signed)
  Subjective:    Patient ID: Matthew House, male    DOB: [birth year], 71 y.o.   MRN: [id number]  HPI  Patient seen with irritation right fourth toe. He went for a pedicure yesterday. He placed his feet in some type of chemical bath and afterwards noticed some stinging on the dorsum right fourth toe. He also noticed some cracking between the left foot between the third and fourth and second and third toes. He denies any diabetes history. No peripheral vascular disease. No cellulitis changes. No fever or chills.   Review of Systems  Constitutional: Negative for fever and chills.       Objective:   Physical Exam  Constitutional: He appears well-developed and well-nourished.  Cardiovascular: Normal rate and regular rhythm.   Pulmonary/Chest: Effort normal and breath sounds normal. No respiratory distress. He has no wheezes. He has no rales.  Skin:       Feet reveal no cellulitis changes. He has area of denuded epithelium dorsum right fourth toe. No signs of secondary infection. He has mild cracking and fissuring between the third and fourth toe of the right foot in between third and fourth and second third toes of the left foot. Again, no cellulitis changes          Assessment & Plan:  Question chemical type abrasion right fourth toe. Silvadene antibiotic ointment and followup immediately if signs of secondary infection

## 2011-02-11 NOTE — Patient Instructions (Signed)
Apply antibiotic topically daily. Followup promptly for signs of secondary infection such as fever, chills, or increased redness involving the foot

## 2011-02-26 ENCOUNTER — Other Ambulatory Visit: Payer: Self-pay | Admitting: Family Medicine

## 2011-03-16 ENCOUNTER — Other Ambulatory Visit: Payer: Self-pay | Admitting: Family Medicine

## 2011-06-02 ENCOUNTER — Other Ambulatory Visit: Payer: Self-pay | Admitting: Family Medicine

## 2011-06-16 ENCOUNTER — Other Ambulatory Visit: Payer: Self-pay | Admitting: *Deleted

## 2011-06-16 MED ORDER — OXYBUTYNIN CHLORIDE 5 MG PO TABS: 5.0000 mg | ORAL_TABLET | Freq: Two times a day (BID) | ORAL | Status: DC

## 2011-06-28 ENCOUNTER — Other Ambulatory Visit: Payer: Self-pay | Admitting: *Deleted

## 2011-06-28 MED ORDER — DOXYCYCLINE MONOHYDRATE 100 MG PO CAPS: 100.0000 mg | ORAL_CAPSULE | Freq: Every day | ORAL | Status: DC

## 2011-08-04 ENCOUNTER — Encounter: Payer: Self-pay | Admitting: Family Medicine

## 2011-08-04 ENCOUNTER — Other Ambulatory Visit: Payer: Self-pay | Admitting: Dermatology

## 2011-08-04 ENCOUNTER — Ambulatory Visit (INDEPENDENT_AMBULATORY_CARE_PROVIDER_SITE_OTHER): Payer: Medicare Other | Admitting: Family Medicine

## 2011-08-04 VITALS — BP 120/70 | Temp 97.7°F | Ht 71.25 in | Wt 225.0 lb

## 2011-08-04 DIAGNOSIS — R319 Hematuria, unspecified: Secondary | ICD-10-CM

## 2011-08-04 DIAGNOSIS — L719 Rosacea, unspecified: Secondary | ICD-10-CM

## 2011-08-04 DIAGNOSIS — Z23 Encounter for immunization: Secondary | ICD-10-CM

## 2011-08-04 DIAGNOSIS — Z0000 Encounter for general adult medical examination without abnormal findings: Secondary | ICD-10-CM

## 2011-08-04 DIAGNOSIS — N401 Enlarged prostate with lower urinary tract symptoms: Secondary | ICD-10-CM

## 2011-08-04 DIAGNOSIS — E785 Hyperlipidemia, unspecified: Secondary | ICD-10-CM

## 2011-08-04 LAB — POCT URINALYSIS DIPSTICK
Glucose, UA: NEGATIVE
Nitrite, UA: NEGATIVE
Protein, UA: NEGATIVE
Urobilinogen, UA: 0.2

## 2011-08-04 LAB — BASIC METABOLIC PANEL
CO2: 28 mEq/L (ref 19–32)
Calcium: 9.4 mg/dL (ref 8.4–10.5)
Creatinine, Ser: 0.7 mg/dL (ref 0.4–1.5)
GFR: 124.6 mL/min (ref >60.00)
Glucose, Bld: 95 mg/dL (ref 70–99)
Sodium: 138 mEq/L (ref 135–145)

## 2011-08-04 LAB — CBC WITH DIFFERENTIAL/PLATELET
Basophils Absolute: 0 10*3/uL (ref 0.0–0.1)
Eosinophils Absolute: 0.1 10*3/uL (ref 0.0–0.7)
Lymphocytes Relative: 29.9 % (ref 12.0–46.0)
MCHC: 33.6 g/dL (ref 30.0–36.0)
MCV: 85 fl (ref 78.0–100.0)
Monocytes Absolute: 0.5 10*3/uL (ref 0.1–1.0)
Neutrophils Relative %: 58 % (ref 43.0–77.0)
Platelets: 133 10*3/uL — ABNORMAL LOW (ref 150.0–400.0)
RBC: 4.49 Mil/uL (ref 4.22–5.81)
RDW: 13.5 % (ref 11.5–14.6)

## 2011-08-04 LAB — LIPID PANEL
HDL: 22.5 mg/dL — ABNORMAL LOW (ref >39.00)
Total CHOL/HDL Ratio: 4
Triglycerides: 276 mg/dL — ABNORMAL HIGH (ref 0.0–149.0)
VLDL: 55.2 mg/dL — ABNORMAL HIGH (ref 0.0–40.0)

## 2011-08-04 LAB — HEPATIC FUNCTION PANEL
AST: 25 U/L (ref 0–37)
Albumin: 4.5 g/dL (ref 3.5–5.2)
Total Bilirubin: 1 mg/dL (ref 0.3–1.2)

## 2011-08-04 LAB — TSH: TSH: 1.72 u[IU]/mL (ref 0.35–5.50)

## 2011-08-04 MED ORDER — SIMVASTATIN 40 MG PO TABS: 40.0000 mg | ORAL_TABLET | Freq: Every day | ORAL | Status: DC

## 2011-08-04 MED ORDER — DOXYCYCLINE MONOHYDRATE 100 MG PO CAPS: 100.0000 mg | ORAL_CAPSULE | Freq: Every day | ORAL | Status: DC

## 2011-08-04 MED ORDER — RANITIDINE HCL 150 MG PO CAPS: 150.0000 mg | ORAL_CAPSULE | Freq: Two times a day (BID) | ORAL | Status: DC

## 2011-08-04 MED ORDER — OXYBUTYNIN CHLORIDE 5 MG PO TABS: 5.0000 mg | ORAL_TABLET | Freq: Two times a day (BID) | ORAL | Status: DC

## 2011-08-04 NOTE — Patient Instructions (Signed)
Continue your current medications  Followup in 1 year sooner if any problems  Strongly consider the shingles vaccine 

## 2011-08-04 NOTE — Progress Notes (Signed)
  Subjective:    Patient ID: Matthew House, male    DOB: 1940/11/10, 71 y.o.   MRN: [id number]  HPI Phyllip is a 71 year old male who comes in today,,,,,,, nonsmoker,,,,,,, for Medicare wellness examination  He takes an aspirin tablet daily along with his 40 mg of Zocor will check lipid panel today  He takes doxycycline 100 mg daily because of rosacea  He takes Ditropan 5 mg twice a day for BPH and nocturia with the medication no nocturia  He takes Zantac 150 mg daily for reflux.  He gets routine eye care, dental care, screening colonoscopy, tetanus 2002 booster today, Pneumovax x2, information given on shingles  Cognitive function normal he walks on a regular basis home health safety reviewed no issues identified, no guns in the house, he does have a health care power of attorney and living will   Review of Systems  Constitutional: Negative.   HENT: Negative.   Eyes: Negative.   Respiratory: Negative.   Cardiovascular: Negative.   Gastrointestinal: Negative.   Genitourinary: Negative.   Musculoskeletal: Negative.   Skin: Negative.   Neurological: Negative.   Hematological: Negative.   Psychiatric/Behavioral: Negative.        Objective:   Physical Exam  Constitutional: He is oriented to person, place, and time. He appears well-developed and well-nourished.  HENT:  Head: Normocephalic and atraumatic.  Right Ear: External ear normal.  Left Ear: External ear normal.  Nose: Nose normal.  Mouth/Throat: Oropharynx is clear and moist.  Eyes: Conjunctivae and EOM are normal. Pupils are equal, round, and reactive to light.  Neck: Normal range of motion. Neck supple. No JVD present. No tracheal deviation present. No thyromegaly present.  Cardiovascular: Normal rate, regular rhythm, normal heart sounds and intact distal pulses.  Exam reveals no gallop and no friction rub.   No murmur heard. Pulmonary/Chest: Effort normal and breath sounds normal. No stridor. No respiratory  distress. He has no wheezes. He has no rales. He exhibits no tenderness.  Abdominal: Soft. Bowel sounds are normal. He exhibits no distension and no mass. There is no tenderness. There is no rebound and no guarding.  Genitourinary: Rectum normal, prostate normal and penis normal. Guaiac negative stool. No penile tenderness.  Musculoskeletal: Normal range of motion. He exhibits no edema and no tenderness.  Lymphadenopathy:    He has no cervical adenopathy.  Neurological: He is alert and oriented to person, place, and time. He has normal reflexes. No cranial nerve deficit. He exhibits normal muscle tone.  Skin: Skin is warm and dry. No rash noted. No erythema. No pallor.  Psychiatric: He has a normal mood and affect. His behavior is normal. Judgment and thought content normal.          Assessment & Plan:  Healthy male  Rosacea continue doxycycline  BPH with outlet obstruction continue Ditropan 5 mg twice a day  Reflux continue Zantac  Hyperlipidemia continue Zocor and aspirin tetanus booster given today

## 2011-08-05 ENCOUNTER — Telehealth: Payer: Self-pay | Admitting: Family Medicine

## 2011-08-05 NOTE — Telephone Encounter (Signed)
Pt called and said that he had another question re: PSA. Pls call.

## 2011-08-10 NOTE — Telephone Encounter (Signed)
Spoke with patient.

## 2011-10-17 ENCOUNTER — Ambulatory Visit (INDEPENDENT_AMBULATORY_CARE_PROVIDER_SITE_OTHER): Payer: Medicare Other | Admitting: Family Medicine

## 2011-10-17 ENCOUNTER — Encounter: Payer: Self-pay | Admitting: Family Medicine

## 2011-10-17 VITALS — BP 140/80 | Temp 98.1°F | Wt 232.0 lb

## 2011-10-17 DIAGNOSIS — R030 Elevated blood-pressure reading, without diagnosis of hypertension: Secondary | ICD-10-CM

## 2011-10-17 DIAGNOSIS — R42 Dizziness and giddiness: Secondary | ICD-10-CM

## 2011-10-17 DIAGNOSIS — IMO0002 Reserved for concepts with insufficient information to code with codable children: Secondary | ICD-10-CM

## 2011-10-17 NOTE — Progress Notes (Signed)
  Subjective:    Patient ID: Matthew House, male    DOB: [birth year], 71 y.o.   MRN: [id number]  HPI Matthew House is a 71 year old male who comes in today for evaluation of 2 problems  He states he has a lump behind his right ear for 2 weeks  He's also concerned about some dizziness he's had. He doesn't describe vertigo he describes a sensation of rocking in a boat.  Also recheck his blood pressure at church it was elevated BP today 140/80 Review of Systems    general ENT cardiovascular review of systems otherwise negative Objective:   Physical Exam  Well-developed well-nourished male in no acute distress HEENT was pertinent and is a sebaceous cyst behind his right ear it was very small. Left ear canal normal right ear full laxer was flushed.  BP right arm sitting position 140/80      Assessment & Plan:  Sebaceous cyst behind right ear observe  Vertigo ENT consult when necessary  Normal blood pressure check BP at home return when necessary

## 2011-10-17 NOTE — Patient Instructions (Signed)
omron  pump up digital blood pressure cuff  Check your blood pressure daily in the morning  Blood pressure goal 140/90 or less  If the symptoms of vertigo and rocking do not resolve consult with Dr. Narda Bonds

## 2012-01-11 ENCOUNTER — Other Ambulatory Visit (INDEPENDENT_AMBULATORY_CARE_PROVIDER_SITE_OTHER): Payer: Medicare Other

## 2012-01-11 ENCOUNTER — Other Ambulatory Visit: Payer: Medicare Other

## 2012-01-11 DIAGNOSIS — N401 Enlarged prostate with lower urinary tract symptoms: Secondary | ICD-10-CM

## 2012-01-11 DIAGNOSIS — N139 Obstructive and reflux uropathy, unspecified: Secondary | ICD-10-CM

## 2012-01-11 DIAGNOSIS — N138 Other obstructive and reflux uropathy: Secondary | ICD-10-CM

## 2012-01-11 LAB — PSA: PSA: 3.29 ng/mL (ref 0.10–4.00)

## 2012-01-18 ENCOUNTER — Ambulatory Visit: Payer: Medicare Other | Admitting: Family Medicine

## 2012-03-15 ENCOUNTER — Ambulatory Visit: Payer: Medicare Other | Admitting: Family Medicine

## 2012-03-26 ENCOUNTER — Other Ambulatory Visit: Payer: Self-pay | Admitting: Dermatology

## 2012-08-23 ENCOUNTER — Encounter: Payer: Medicare Other | Admitting: Family Medicine

## 2012-08-28 ENCOUNTER — Ambulatory Visit (INDEPENDENT_AMBULATORY_CARE_PROVIDER_SITE_OTHER): Payer: Medicare Other | Admitting: Internal Medicine

## 2012-08-28 ENCOUNTER — Encounter: Payer: Self-pay | Admitting: Internal Medicine

## 2012-08-28 VITALS — BP 140/82 | HR 77 | Temp 97.9°F | Resp 20 | Ht 71.0 in | Wt 242.0 lb

## 2012-08-28 DIAGNOSIS — IMO0002 Reserved for concepts with insufficient information to code with codable children: Secondary | ICD-10-CM

## 2012-08-28 DIAGNOSIS — N138 Other obstructive and reflux uropathy: Secondary | ICD-10-CM

## 2012-08-28 DIAGNOSIS — E785 Hyperlipidemia, unspecified: Secondary | ICD-10-CM

## 2012-08-28 DIAGNOSIS — N401 Enlarged prostate with lower urinary tract symptoms: Secondary | ICD-10-CM

## 2012-08-28 DIAGNOSIS — R6889 Other general symptoms and signs: Secondary | ICD-10-CM

## 2012-08-28 DIAGNOSIS — Z0000 Encounter for general adult medical examination without abnormal findings: Secondary | ICD-10-CM

## 2012-08-28 LAB — LIPID PANEL
Cholesterol: 132 mg/dL (ref 0–200)
HDL: 22.5 mg/dL — ABNORMAL LOW (ref >39.00)
Triglycerides: 481 mg/dL — ABNORMAL HIGH (ref 0.0–149.0)

## 2012-08-28 LAB — LDL CHOLESTEROL, DIRECT: Direct LDL: 48.2 mg/dL

## 2012-08-28 LAB — CBC WITH DIFFERENTIAL/PLATELET
Basophils Relative: 0.3 % (ref 0.0–3.0)
Eosinophils Relative: 4 % (ref 0.0–5.0)
MCV: 83.8 fl (ref 78.0–100.0)
Monocytes Absolute: 0.6 10*3/uL (ref 0.1–1.0)
Neutrophils Relative %: 55.2 % (ref 43.0–77.0)
RBC: 4.38 Mil/uL (ref 4.22–5.81)
WBC: 5.8 10*3/uL (ref 4.5–10.5)

## 2012-08-28 LAB — COMPREHENSIVE METABOLIC PANEL
Albumin: 4.2 g/dL (ref 3.5–5.2)
Alkaline Phosphatase: 91 U/L (ref 39–117)
BUN: 14 mg/dL (ref 6–23)
Glucose, Bld: 90 mg/dL (ref 70–99)
Potassium: 4.3 mEq/L (ref 3.5–5.1)
Total Bilirubin: 0.8 mg/dL (ref 0.3–1.2)

## 2012-08-28 LAB — TSH: TSH: 2.29 u[IU]/mL (ref 0.35–5.50)

## 2012-08-28 MED ORDER — SIMVASTATIN 40 MG PO TABS: 40.0000 mg | ORAL_TABLET | Freq: Every day | ORAL | Status: DC

## 2012-08-28 NOTE — Progress Notes (Signed)
Subjective:    Patient ID: Matthew House, male    DOB: 1940-10-24, 72 y.o.   MRN: [id number]  HPI 72 year old patient who is seen today for a preventive health examination. Medical problems include BPH and a history of dyslipidemia. He remains on simvastatin which he tolerates well. He has a history of mild gastroesophageal reflux disease. He is recovering from a right knee arthroscopic surgery  Social history married. 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 Nonsmoker for 35 years Family history. Unknown;  orphaned   Past Medical History  Diagnosis Date  . GERD (gastroesophageal reflux disease)   . Rosacea   . Hyperlipidemia   . BPH (benign prostatic hyperplasia)   . S/P tonsillectomy and adenoidectomy   . IBS (irritable bowel syndrome)     History   Social History  . Marital Status: Married    Spouse Name: N/A    Number of Children: N/A  . Years of Education: N/A   Occupational History  . Not on file.   Social History Main Topics  . Smoking status: Former Smoker    Quit date: 01/03/1978  . Smokeless tobacco: Never Used  . Alcohol Use: No  . Drug Use: No  . Sexual Activity: Not on file   Other Topics Concern  . Not on file   Social History Narrative  . No narrative on file    Past Surgical History  Procedure Laterality Date  . Tonsillectomy and adenoidectomy  1952  . Vasectomy  1992  . Incise and drain abcess  2002    right 3rd finger    No family history on file.  Allergies  Allergen Reactions  . Penicillins     unspecified    Current Outpatient Prescriptions on File Prior to Visit  Medication Sig Dispense Refill  . aspirin 81 MG tablet Take 81 mg by mouth daily.        Marland Kitchen doxycycline (MONODOX) 100 MG capsule Take 1 capsule (100 mg total) by mouth daily.  100 capsule  3  . oxybutynin (DITROPAN) 5 MG tablet Take 5 mg by mouth daily.      . ranitidine (ZANTAC) 150 MG capsule Take 1 capsule (150 mg total) by mouth 2 (two) times daily. Take  one tablet in the am and one tablet in the pm  200 capsule  3  . Saw Palmetto, Serenoa repens, 1000 MG CAPS Take by mouth.        . simvastatin (ZOCOR) 40 MG tablet Take 1 tablet (40 mg total) by mouth at bedtime.  100 tablet  3   No current facility-administered medications on file prior to visit.    BP 140/82  Pulse 77  Temp(Src) 97.9 F (36.6 C) (Oral)  Resp 20  Ht 5\' 11"  (1.803 m)  Wt 242 lb (109.77 kg)  BMI 33.77 kg/m2  SpO2 98%   1. Risk factors, based on past  M,S,F history-card*factors include dyslipidemia  2.  Physical activities: Remains quite active. 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  Depression/mood: No history depression or mood disorder  4.  Hearing: No deficits  5.  ADL's: Independent in aspects of daily living  6.  Fall risk: Low  7.  Home safety: No problems identified  8.  Height weight, and visual acuity; height and weight stable no change in visual acuity. Uses reading  glasses  9.  Counseling: Heart healthy diet modest weight loss encouraged  10. Lab orders based on risk factors: Laboratory profile including lipid panel will be reviewed  11. Referral : Not appropriate at this time 12. Care plan: Heart healthy diet modest weight loss. We'll continue regular exercise program  13. Cognitive assessment: Alert and oriented normal affect. No cognitive dysfunction      Review of Systems  Constitutional: Negative for fever, chills, activity change, appetite change and fatigue.  HENT: Negative for hearing loss, ear pain, congestion, rhinorrhea, sneezing, mouth sores, trouble swallowing, neck pain, neck stiffness, dental problem, voice change, sinus pressure and tinnitus.   Eyes: Negative for photophobia, pain, redness and visual disturbance.  Respiratory: Negative for apnea, cough, choking, chest tightness, shortness of breath and wheezing.    Cardiovascular: Negative for chest pain, palpitations and leg swelling.  Gastrointestinal: Negative for nausea, vomiting, abdominal pain, diarrhea, constipation, blood in stool, abdominal distention, anal bleeding and rectal pain.  Genitourinary: Negative for dysuria, urgency, frequency, hematuria, flank pain, decreased urine volume, discharge, penile swelling, scrotal swelling, difficulty urinating, genital sores and testicular pain.  Musculoskeletal: Negative for myalgias, back pain, joint swelling, arthralgias and gait problem.       Postop residual right knee swelling  Skin: Negative for color change, rash and wound.  Neurological: Negative for dizziness, tremors, seizures, syncope, facial asymmetry, speech difficulty, weakness, light-headedness, numbness and headaches.  Hematological: Negative for adenopathy. Does not bruise/bleed easily.  Psychiatric/Behavioral: Negative for suicidal ideas, hallucinations, behavioral problems, confusion, sleep disturbance, self-injury, dysphoric mood, decreased concentration and agitation. The patient is not nervous/anxious.        Objective:   Physical Exam  Constitutional: He appears well-developed and well-nourished.  HENT:  Head: Normocephalic and atraumatic.  Right Ear: External ear normal.  Left Ear: External ear normal.  Nose: Nose normal.  Mouth/Throat: Oropharynx is clear and moist.  Eyes: Conjunctivae and EOM are normal. Pupils are equal, round, and reactive to light. No scleral icterus.  Neck: Normal range of motion. Neck supple. No JVD present. No thyromegaly present.  Cardiovascular: Regular rhythm, normal heart sounds and intact distal pulses.  Exam reveals no gallop and no friction rub.   No murmur heard. Pulmonary/Chest: Effort normal and breath sounds normal. He exhibits no tenderness.  Abdominal: Soft. Bowel sounds are normal. He exhibits no distension and no mass. There is no tenderness.  Genitourinary: Penis normal. Guaiac  negative stool.  Prostate +3 enlarged symmetrical  Musculoskeletal: Normal range of motion. He exhibits no edema and no tenderness.  Status post right knee arthroscopic surgery  Lymphadenopathy:    He has no cervical adenopathy.  Neurological: He is alert. He has normal reflexes. No cranial nerve deficit. Coordination normal.  Skin: Skin is warm and dry. No rash noted.  Psychiatric: He has a normal mood and affect. His behavior is normal.          Assessment & Plan:  Preventive health examination Dyslipidemia BPH GERD stable Status post right knee arthroscopic surgery  Medicines updated Laboratory profile reviewed Return in one year for followup

## 2012-08-28 NOTE — Patient Instructions (Signed)
Limit your sodium (Salt) intake    It is important that you exercise regularly, at least 20 minutes 3 to 4 times per week.  If you develop chest pain or shortness of breath seek  medical attention.  You need to lose weight.  Consider a lower calorie diet and regular exercise.  Return in one year for follow-up 

## 2012-08-31 ENCOUNTER — Telehealth: Payer: Self-pay | Admitting: Family Medicine

## 2012-08-31 DIAGNOSIS — R972 Elevated prostate specific antigen [PSA]: Secondary | ICD-10-CM

## 2012-08-31 NOTE — Telephone Encounter (Signed)
PT would like to speak with you in regards to his recent labs. He stated that he's already received these results but forgot to write something down. Please assist.

## 2012-09-04 NOTE — Telephone Encounter (Signed)
Spoke with patient.

## 2012-09-27 ENCOUNTER — Other Ambulatory Visit: Payer: Self-pay | Admitting: Family Medicine

## 2012-10-03 ENCOUNTER — Encounter: Payer: Self-pay | Admitting: Family Medicine

## 2012-10-03 ENCOUNTER — Ambulatory Visit (INDEPENDENT_AMBULATORY_CARE_PROVIDER_SITE_OTHER): Payer: Medicare Other | Admitting: Family Medicine

## 2012-10-03 VITALS — BP 160/80

## 2012-10-03 DIAGNOSIS — R972 Elevated prostate specific antigen [PSA]: Secondary | ICD-10-CM

## 2012-10-03 DIAGNOSIS — Z23 Encounter for immunization: Secondary | ICD-10-CM

## 2012-10-03 DIAGNOSIS — R6889 Other general symptoms and signs: Secondary | ICD-10-CM

## 2012-10-03 DIAGNOSIS — Z9889 Other specified postprocedural states: Secondary | ICD-10-CM | POA: Insufficient documentation

## 2012-10-03 DIAGNOSIS — IMO0002 Reserved for concepts with insufficient information to code with codable children: Secondary | ICD-10-CM

## 2012-10-03 NOTE — Patient Instructions (Signed)
Decrease the Aleve to one twice a day Monday through Friday stopped on the weekends  Check a morning blood pressure daily  Blood pressure goal 140/90 or less.  After 4 months if your blood pressure is normal then stop checking it  If your blood pressure is not normal brain all the data and the device and come back and see me for followup

## 2012-10-03 NOTE — Progress Notes (Signed)
  Subjective:    Patient ID: Matthew House, male    DOB: 1940/10/15, 72 y.o.   MRN: [id number]  HPI  Matthew House is a 72 year old male who comes in today for evaluation of 2 actually 3 issues  3 months ago he had surgery to his right knee torn cartilage. When I get in there he was told the damage was greater than 90 saw. The veteran out of rehabilitation his knee but he does need at some point had total knee replacement  He saw Dr. a this summer for general physical PSA was 7.43. He went for a urologic evaluation PSA dropped to 4.1. He's could be seen by them every 6 months  He takes Aleve 2 tabs twice a day BP 160/80 by [place]. by me.  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      Assessment & Plan:  Status post cartilage surgery right knee continue rehabilitation as outlined by orthopedist  Elevated PSA followup by urology every 6 months  Hypertension plan decrease Aleve to one twice a day BP check every morning followup of blood pressure not at goal within 30 days

## 2012-10-05 ENCOUNTER — Other Ambulatory Visit: Payer: Self-pay | Admitting: Family Medicine

## 2012-10-11 ENCOUNTER — Other Ambulatory Visit: Payer: Self-pay | Admitting: Family Medicine

## 2013-01-07 ENCOUNTER — Other Ambulatory Visit: Payer: Self-pay | Admitting: Family Medicine

## 2013-02-18 ENCOUNTER — Telehealth: Payer: Self-pay | Admitting: Family Medicine

## 2013-02-18 NOTE — Telephone Encounter (Signed)
error 

## 2013-03-15 ENCOUNTER — Encounter (HOSPITAL_COMMUNITY): Payer: Self-pay | Admitting: Emergency Medicine

## 2013-03-15 ENCOUNTER — Emergency Department (HOSPITAL_COMMUNITY)
Admission: EM | Admit: 2013-03-15 | Discharge: 2013-03-15 | Disposition: A | Discharge status: Home / Self Care | Payer: Medicare Other | Attending: Emergency Medicine | Admitting: Emergency Medicine

## 2013-03-15 ENCOUNTER — Telehealth: Payer: Self-pay | Admitting: Family Medicine

## 2013-03-15 ENCOUNTER — Emergency Department (HOSPITAL_COMMUNITY): Payer: Medicare Other

## 2013-03-15 DIAGNOSIS — Z792 Long term (current) use of antibiotics: Secondary | ICD-10-CM | POA: Insufficient documentation

## 2013-03-15 DIAGNOSIS — Z7982 Long term (current) use of aspirin: Secondary | ICD-10-CM | POA: Insufficient documentation

## 2013-03-15 DIAGNOSIS — K219 Gastro-esophageal reflux disease without esophagitis: Secondary | ICD-10-CM | POA: Insufficient documentation

## 2013-03-15 DIAGNOSIS — Z87891 Personal history of nicotine dependence: Secondary | ICD-10-CM | POA: Insufficient documentation

## 2013-03-15 DIAGNOSIS — Z79899 Other long term (current) drug therapy: Secondary | ICD-10-CM | POA: Insufficient documentation

## 2013-03-15 DIAGNOSIS — R12 Heartburn: Secondary | ICD-10-CM | POA: Insufficient documentation

## 2013-03-15 DIAGNOSIS — E785 Hyperlipidemia, unspecified: Secondary | ICD-10-CM | POA: Insufficient documentation

## 2013-03-15 DIAGNOSIS — R0789 Other chest pain: Secondary | ICD-10-CM | POA: Diagnosis present

## 2013-03-15 DIAGNOSIS — R42 Dizziness and giddiness: Secondary | ICD-10-CM | POA: Insufficient documentation

## 2013-03-15 DIAGNOSIS — N400 Enlarged prostate without lower urinary tract symptoms: Secondary | ICD-10-CM | POA: Insufficient documentation

## 2013-03-15 DIAGNOSIS — Z872 Personal history of diseases of the skin and subcutaneous tissue: Secondary | ICD-10-CM | POA: Insufficient documentation

## 2013-03-15 LAB — CBC WITH DIFFERENTIAL/PLATELET
BASOS PCT: 0 % (ref 0–1)
Basophils Absolute: 0 10*3/uL (ref 0.0–0.1)
EOS ABS: 0.1 10*3/uL (ref 0.0–0.7)
EOS PCT: 2 % (ref 0–5)
HEMATOCRIT: 36.9 % — AB (ref 39.0–52.0)
HEMOGLOBIN: 12.9 g/dL — AB (ref 13.0–17.0)
Lymphocytes Relative: 28 % (ref 12–46)
Lymphs Abs: 1.6 10*3/uL (ref 0.7–4.0)
MCH: 28.4 pg (ref 26.0–34.0)
MCHC: 35 g/dL (ref 30.0–36.0)
MCV: 81.1 fL (ref 78.0–100.0)
MONO ABS: 0.5 10*3/uL (ref 0.1–1.0)
MONOS PCT: 8 % (ref 3–12)
Neutro Abs: 3.5 10*3/uL (ref 1.7–7.7)
Neutrophils Relative %: 62 % (ref 43–77)
Platelets: 125 10*3/uL — ABNORMAL LOW (ref 150–400)
RBC: 4.55 MIL/uL (ref 4.22–5.81)
RDW: 13.4 % (ref 11.5–15.5)
WBC: 5.6 10*3/uL (ref 4.0–10.5)

## 2013-03-15 LAB — COMPREHENSIVE METABOLIC PANEL
ALBUMIN: 4.6 g/dL (ref 3.5–5.2)
ALT: 29 U/L (ref 0–53)
AST: 21 U/L (ref 0–37)
Alkaline Phosphatase: 89 U/L (ref 39–117)
BILIRUBIN TOTAL: 0.7 mg/dL (ref 0.3–1.2)
BUN: 14 mg/dL (ref 6–23)
CO2: 24 mEq/L (ref 19–32)
CREATININE: 0.74 mg/dL (ref 0.50–1.35)
Calcium: 9.8 mg/dL (ref 8.4–10.5)
Chloride: 98 mEq/L (ref 96–112)
GFR calc Af Amer: >90 mL/min (ref >90)
GFR calc non Af Amer: >90 mL/min (ref >90)
Glucose, Bld: 101 mg/dL — ABNORMAL HIGH (ref 70–99)
Potassium: 3.9 mEq/L (ref 3.7–5.3)
Sodium: 136 mEq/L — ABNORMAL LOW (ref 137–147)
Total Protein: 7.8 g/dL (ref 6.0–8.3)

## 2013-03-15 LAB — TROPONIN I

## 2013-03-15 MED ORDER — SIMETHICONE 40 MG/0.6ML PO SUSP (UNIT DOSE)
40.0000 mg | Freq: Once | ORAL | Status: AC
  Administered 2013-03-15: 40 mg via ORAL
  Filled 2013-03-15: qty 0.6

## 2013-03-15 NOTE — ED Notes (Signed)
Pt to CXR.

## 2013-03-15 NOTE — ED Provider Notes (Signed)
CSN: [id number]     Arrival date & time 03/15/13  1129 History   First MD Initiated Contact with Patient 03/15/13 1256     Chief Complaint  Patient presents with  . Chest Pain  . Heartburn     (Consider location/radiation/quality/duration/timing/severity/associated sxs/prior Treatment) Patient is a 73 y.o. male presenting with chest pain. The history is provided by the patient.  Chest Pain Pain location:  Substernal area Pain quality comment:  Fullness Pain radiates to:  Does not radiate Pain radiates to the back: no   Pain severity:  Mild Onset quality:  Gradual Duration:  1 day Timing:  Constant Progression:  Unchanged Chronicity:  New Context: at rest   Relieved by: mildly w/ zantac. Worsened by:  Nothing tried Ineffective treatments:  None tried Associated symptoms: dizziness (very mild)   Associated symptoms: no abdominal pain, no cough, no fever, no headache, no nausea, no numbness, no shortness of breath and not vomiting   Associated symptoms comment:  Flushing sensation    Past Medical History  Diagnosis Date  . GERD (gastroesophageal reflux disease)   . Rosacea   . Hyperlipidemia   . BPH (benign prostatic hyperplasia)   . S/P tonsillectomy and adenoidectomy   . IBS (irritable bowel syndrome)    Past Surgical History  Procedure Laterality Date  . Tonsillectomy and adenoidectomy  1952  . Vasectomy  1992  . Incise and drain abcess  2002    right 3rd finger  . Knee cartilage surgery Right    No family history on file. History  Substance Use Topics  . Smoking status: Former Smoker    Quit date: 01/03/1978  . Smokeless tobacco: Never Used  . Alcohol Use: No    Review of Systems  Constitutional: Negative for fever.  HENT: Negative for drooling and rhinorrhea.   Eyes: Negative for pain.  Respiratory: Negative for cough and shortness of breath.   Cardiovascular: Positive for chest pain. Negative for leg swelling.  Gastrointestinal: Negative for nausea,  vomiting, abdominal pain and diarrhea.  Genitourinary: Negative for dysuria and hematuria.  Musculoskeletal: Negative for gait problem and neck pain.  Skin: Negative for color change.  Neurological: Positive for dizziness (very mild). Negative for numbness and headaches.  Hematological: Negative for adenopathy.  Psychiatric/Behavioral: Negative for behavioral problems.  All other systems reviewed and are negative.      Allergies  Review of patient's allergies indicates no known allergies.  Home Medications   Current Outpatient Rx  Name  Route  Sig  Dispense  Refill  . aspirin 81 MG tablet   Oral   Take 81 mg by mouth every evening.          Marland Kitchen doxycycline (MONODOX) 100 MG capsule   Oral   Take 100 mg by mouth daily.         . Glucosamine-Chondroitin (MOVE FREE PO)   Oral   Take 2 tablets by mouth every morning.         . naproxen sodium (ALEVE) 220 MG tablet   Oral   Take 220 mg by mouth 2 (two) times daily as needed (pain).          Marland Kitchen oxybutynin (DITROPAN) 5 MG tablet   Oral   Take 5 mg by mouth every morning.          . ranitidine (ZANTAC) 150 MG capsule   Oral   Take 1 capsule (150 mg total) by mouth 2 (two) times daily. Take one tablet in the  am and one tablet in the pm   200 capsule   3   . Saw Palmetto, Serenoa repens, 1000 MG CAPS   Oral   Take 1 capsule by mouth 2 (two) times daily.          . simvastatin (ZOCOR) 40 MG tablet   Oral   Take 1 tablet (40 mg total) by mouth at bedtime.   100 tablet   3    BP 168/74  Temp(Src) 97.8 F (36.6 C) (Oral)  Resp 14  SpO2 97% Physical Exam  Nursing note and vitals reviewed. Constitutional: He is oriented to person, place, and time. He appears well-developed and well-nourished.  HENT:  Head: Normocephalic and atraumatic.  Right Ear: External ear normal.  Left Ear: External ear normal.  Nose: Nose normal.  Mouth/Throat: Oropharynx is clear and moist. No oropharyngeal exudate.  Eyes:  Conjunctivae and EOM are normal. Pupils are equal, round, and reactive to light.  Neck: Normal range of motion. Neck supple.  Cardiovascular: Normal rate, regular rhythm, normal heart sounds and intact distal pulses.  Exam reveals no gallop and no friction rub.   No murmur heard. Pulmonary/Chest: Effort normal and breath sounds normal. No respiratory distress. He has no wheezes.  Abdominal: Soft. Bowel sounds are normal. He exhibits no distension. There is no tenderness. There is no rebound and no guarding.  Musculoskeletal: Normal range of motion. He exhibits no edema and no tenderness.  Neurological: He is alert and oriented to person, place, and time.  Skin: Skin is warm and dry.  Psychiatric: He has a normal mood and affect. His behavior is normal.    ED Course  Procedures (including critical care time) Labs Review Labs Reviewed  CBC WITH DIFFERENTIAL - Abnormal; Notable for the following:    Hemoglobin 12.9 (*)    HCT 36.9 (*)    Platelets 125 (*)    All other components within normal limits  COMPREHENSIVE METABOLIC PANEL - Abnormal; Notable for the following:    Sodium 136 (*)    Glucose, Bld 101 (*)    All other components within normal limits  TROPONIN I   Imaging Review Dg Chest 2 View  03/15/2013   CLINICAL DATA:  Chest pressure  EXAM: CHEST  2 VIEW  COMPARISON:  None.  FINDINGS: The heart size and mediastinal contours are within normal limits. Both lungs are clear. The visualized skeletal structures are unremarkable.  IMPRESSION: No active cardiopulmonary disease.   Electronically Signed   By: Inez Catalina M.D.   On: 03/15/2013 12:37     EKG Interpretation   Date/Time:  Friday March 15 2013 11:43:06 EDT Ventricular Rate:  81 PR Interval:  238 QRS Duration: 108 QT Interval:  392 QTC Calculation: 455 R Axis:   -20 Text Interpretation:  Sinus rhythm Prolonged PR interval Borderline left  axis deviation Abnormal R-wave progression, early transition Baseline  wander  in lead(s) V6 Confirmed by Creola Krotz  MD, Georgio Hattabaugh ([id number]) on  03/15/2013 1:13:46 PM      MDM   Final diagnoses:  Atypical chest pain    1:19 PM 73 y.o. male who presents with a heaviness sensation in his chest which began yesterday around 4 PM after eating. He states that he has had similar symptoms in the past w/ increased gas and belching. He notes that his symptoms persisted overnight although he was able to sleep. He notes some sensation of flushing and some mild dizziness. He currently rates his pain as a 4/10.  He is afebrile and vital signs are unremarkable here. Cardiac RF's include HLP, age. Screening labwork and chest x-ray already performed which were noncontributory. Patient would prefer to go home. Will plan on delta trop and simethicone to see if it helps his symptoms.   Simethicone led to belching and relief of pt's pain. He is low risk for MACE per HEART score and his pain is atypical as it is constant and unwavering while at rest for nearly 24 hours now. Likely gi cause of sx. Labs and delta trop neg.  I have discussed the diagnosis/risks/treatment options with the patient and family and believe the pt to be eligible for discharge home to follow-up with his pcp in 3 days to discuss further testing. We also discussed returning to the ED immediately if new or worsening sx occur. We discussed the sx which are most concerning (e.g., worsening pain, fever, vomiting, sob) that necessitate immediate return. Medications administered to the patient during their visit and any new prescriptions provided to the patient are listed below.  Medications given during this visit Medications  simethicone (MYLICON) 40 JK/9.3OI suspension 40 mg (40 mg Oral Given 03/15/13 1345)    Discharge Medication List as of 03/15/2013  4:13 PM         Leroy, MD 03/16/13 1032

## 2013-03-15 NOTE — Discharge Instructions (Signed)
Chest Pain (Nonspecific) °Chest pain has many causes. Your pain could be caused by something serious, such as a heart attack or a blood clot in the lungs. It could also be caused by something less serious, such as a chest bruise or a virus. Follow up with your doctor. More lab tests or other studies may be needed to find the cause of your pain. Most of the time, nonspecific chest pain will improve within 2 to 3 days of rest and mild pain medicine. °HOME CARE °· For chest bruises, you may put ice on the sore area for 15-20 minutes, 03-04 times a day. Do this only if it makes you feel better. °· Put ice in a plastic bag. °· Place a towel between the skin and the bag. °· Rest for the next 2 to 3 days. °· Go back to work if the pain improves. °· See your doctor if the pain lasts longer than 1 to 2 weeks. °· Only take medicine as told by your doctor. °· Quit smoking if you smoke. °GET HELP RIGHT AWAY IF:  °· There is more pain or pain that spreads to the arm, neck, jaw, back, or belly (abdomen). °· You have shortness of breath. °· You cough more than usual or cough up blood. °· You have very bad back or belly pain, feel sick to your stomach (nauseous), or throw up (vomit). °· You have very bad weakness. °· You pass out (faint). °· You have a fever. °Any of these problems may be serious and may be an emergency. Do not wait to see if the problems will go away. Get medical help right away. Call your local emergency services 911 in [place] Do not drive yourself to the hospital. °MAKE SURE YOU:  °· Understand these instructions. °· Will watch this condition. °· Will get help right away if you or your child is not doing well or gets worse. °Document Released: 06/08/2007 Document Revised: 03/14/2011 Document Reviewed: 06/08/2007 °[employer]® Patient Information ©2014 [employer]. ° °

## 2013-03-15 NOTE — Telephone Encounter (Signed)
The patient came into the office today complaining of chest discomfort.  He described it as feeling "full" and "uncomfortable."  Pt has a hx of reflux but stated that this is "different."  The discomfort starts in his chest and extends to his throat.  Also complains of dizziness and flushing of his cheeks with a warm feeling throughout his body.  Started this morning.  Checked the patient's vitals  BP:  180/80 O2: 97% HR: 85  Spoke with Dr. Maudie Mercury who advised the patient goes directly to the ED.  Pt stated he wanted to go to [facility].  I asked the patient if he were okay to drive.  He stated he was fine to drive. Patient left.

## 2013-03-15 NOTE — ED Notes (Signed)
Pt reports improvement after simethicone.

## 2013-03-15 NOTE — ED Notes (Signed)
Pt a+ox4, presents from home with c/o burning/heaviness in chest intermittently since last night.  Pt reports eating irritating foods last night.  Pt reports feeling goes up into throat.  Pt also reports feeling "hot in my face".  Pt denies SOB, dizziness, weakness.  Speaking full/clear sentences, rr even/un-lab.  LSCTAB.  +bsx4 quads, abd s/nt/nd.  Skin PWD.

## 2013-03-18 ENCOUNTER — Telehealth: Payer: Self-pay | Admitting: Family Medicine

## 2013-03-18 NOTE — Telephone Encounter (Signed)
appt sch °

## 2013-03-18 NOTE — Telephone Encounter (Signed)
Please call patient.  Okay to use 2 same day slots

## 2013-03-18 NOTE — Telephone Encounter (Addendum)
PT seen in hospital on Fri. They think its a GI issues. Pt was having chest pains. Pt need post hosp fup/ only same day. pls advise

## 2013-03-20 ENCOUNTER — Ambulatory Visit (INDEPENDENT_AMBULATORY_CARE_PROVIDER_SITE_OTHER): Payer: Medicare Other | Admitting: Family Medicine

## 2013-03-20 ENCOUNTER — Encounter: Payer: Self-pay | Admitting: Family Medicine

## 2013-03-20 VITALS — BP 130/80 | Temp 98.0°F | Wt 246.0 lb

## 2013-03-20 DIAGNOSIS — K2100 Gastro-esophageal reflux disease with esophagitis, without bleeding: Secondary | ICD-10-CM

## 2013-03-20 DIAGNOSIS — R972 Elevated prostate specific antigen [PSA]: Secondary | ICD-10-CM

## 2013-03-20 LAB — PSA: PSA: 3.61 ng/mL (ref 0.10–4.00)

## 2013-03-20 NOTE — Progress Notes (Signed)
   Subjective:    Patient ID: Matthew House, male    DOB: May 19, 1940, 73 y.o.   MRN: [id number]  HPI Gen 73 year old married male nonsmoker who comes in today for evaluation of chest pain  He went to the emergency room on March 13 because of severe chest pain. He been going on for about 24 hours. He is a long history of reflux esophagitis and has been on Zantac 150 mg twice a day for many years. He says over the last couple months has gotten worse. He feels like he has a bubble in his stomach and can't pass gas. He has no difficulty swallowing. He ended evaluation emerge from which was negative for cardiac disease he was given Maalox to take 2 teaspoons 4 times daily and this is markedly decreased his symptoms. Again no difficulty swallowing  In August he had an elevated PSA followup was 4.3. Initial was 7.3. He would like a followup PSA today   Review of Systems    negative Objective:   Physical Exam   Well-developed well-nourished male no acute distress vital signs stable he is afebrile      Assessment & Plan:  History of reflux esophagitis long-term problem now with more issues with gas and bloating although no dysphasia plan GI consult for further evaluation

## 2013-03-20 NOTE — Progress Notes (Signed)
Pre visit review using our clinic review tool, if applicable. No additional management support is needed unless otherwise documented below in the visit note. 

## 2013-03-20 NOTE — Patient Instructions (Signed)
Maalox 2 teaspoons 3 times daily  Continue Zantac one twice daily  Bryson Ha and no to GI to get you set up for consult

## 2013-03-21 ENCOUNTER — Encounter: Payer: Self-pay | Admitting: Internal Medicine

## 2013-03-21 ENCOUNTER — Telehealth: Payer: Self-pay | Admitting: Internal Medicine

## 2013-03-21 NOTE — Telephone Encounter (Signed)
Left a message for patient to call back. 

## 2013-03-22 ENCOUNTER — Other Ambulatory Visit: Payer: Self-pay | Admitting: Family Medicine

## 2013-03-22 NOTE — Telephone Encounter (Signed)
Left a message for patient to call me. 

## 2013-03-25 ENCOUNTER — Encounter: Payer: Self-pay | Admitting: *Deleted

## 2013-03-25 NOTE — Telephone Encounter (Signed)
Left a message for patient to call me. 

## 2013-03-26 NOTE — Telephone Encounter (Signed)
Patient did not return calls x 3 days.

## 2013-04-09 ENCOUNTER — Other Ambulatory Visit: Payer: Self-pay | Admitting: Family Medicine

## 2013-05-16 ENCOUNTER — Ambulatory Visit: Payer: Medicare Other | Admitting: Family Medicine

## 2013-05-28 ENCOUNTER — Ambulatory Visit: Payer: Medicare Other | Admitting: Internal Medicine

## 2013-07-18 ENCOUNTER — Telehealth: Payer: Self-pay | Admitting: Family Medicine

## 2013-07-18 NOTE — Telephone Encounter (Signed)
Pt would like to see dr todd today to check out some spots he has. Pt is having basal cell carcinoma surgery on 8/10 and wants dr todd to look at some other places on his face. He wants to see him before his surgery and dr todd is out until 8/6. Pt would like a cb

## 2013-07-18 NOTE — Telephone Encounter (Signed)
Spoke with patient and he went to his dermatologist for a skin check.  He will be have surgery 08/12/13

## 2013-09-05 ENCOUNTER — Ambulatory Visit (INDEPENDENT_AMBULATORY_CARE_PROVIDER_SITE_OTHER): Payer: Medicare Other | Admitting: Family Medicine

## 2013-09-05 ENCOUNTER — Encounter: Payer: Self-pay | Admitting: Family Medicine

## 2013-09-05 VITALS — BP 130/80 | Temp 98.0°F | Ht 71.0 in | Wt 233.0 lb

## 2013-09-05 DIAGNOSIS — R319 Hematuria, unspecified: Secondary | ICD-10-CM

## 2013-09-05 DIAGNOSIS — K2100 Gastro-esophageal reflux disease with esophagitis, without bleeding: Secondary | ICD-10-CM

## 2013-09-05 DIAGNOSIS — R972 Elevated prostate specific antigen [PSA]: Secondary | ICD-10-CM

## 2013-09-05 DIAGNOSIS — E785 Hyperlipidemia, unspecified: Secondary | ICD-10-CM

## 2013-09-05 DIAGNOSIS — N138 Other obstructive and reflux uropathy: Secondary | ICD-10-CM

## 2013-09-05 DIAGNOSIS — N401 Enlarged prostate with lower urinary tract symptoms: Secondary | ICD-10-CM

## 2013-09-05 DIAGNOSIS — Z23 Encounter for immunization: Secondary | ICD-10-CM

## 2013-09-05 LAB — POCT URINALYSIS DIPSTICK
Bilirubin, UA: NEGATIVE
GLUCOSE UA: NEGATIVE
Ketones, UA: NEGATIVE
Leukocytes, UA: NEGATIVE
Nitrite, UA: NEGATIVE
Protein, UA: NEGATIVE
RBC UA: NEGATIVE
SPEC GRAV UA: 1.02
Urobilinogen, UA: 0.2
pH, UA: 7

## 2013-09-05 LAB — CBC WITH DIFFERENTIAL/PLATELET
BASOS PCT: 0.4 % (ref 0.0–3.0)
Basophils Absolute: 0 10*3/uL (ref 0.0–0.1)
EOS PCT: 3.2 % (ref 0.0–5.0)
Eosinophils Absolute: 0.2 10*3/uL (ref 0.0–0.7)
HCT: 39.1 % (ref 39.0–52.0)
Hemoglobin: 13.3 g/dL (ref 13.0–17.0)
LYMPHS PCT: 29.7 % (ref 12.0–46.0)
Lymphs Abs: 1.7 10*3/uL (ref 0.7–4.0)
MCHC: 33.9 g/dL (ref 30.0–36.0)
MCV: 84.1 fl (ref 78.0–100.0)
Monocytes Absolute: 0.6 10*3/uL (ref 0.1–1.0)
Monocytes Relative: 9.5 % (ref 3.0–12.0)
NEUTROS ABS: 3.4 10*3/uL (ref 1.4–7.7)
Neutrophils Relative %: 57.2 % (ref 43.0–77.0)
Platelets: 156 10*3/uL (ref 150.0–400.0)
RBC: 4.65 Mil/uL (ref 4.22–5.81)
RDW: 14.2 % (ref 11.5–15.5)
WBC: 5.9 10*3/uL (ref 4.0–10.5)

## 2013-09-05 LAB — TSH: TSH: 2.03 u[IU]/mL (ref 0.35–4.50)

## 2013-09-05 LAB — LIPID PANEL
Cholesterol: 107 mg/dL (ref 0–200)
HDL: 20.3 mg/dL — ABNORMAL LOW (ref >39.00)
NONHDL: 86.7
Total CHOL/HDL Ratio: 5
Triglycerides: 347 mg/dL — ABNORMAL HIGH (ref 0.0–149.0)
VLDL: 69.4 mg/dL — ABNORMAL HIGH (ref 0.0–40.0)

## 2013-09-05 LAB — LDL CHOLESTEROL, DIRECT: Direct LDL: 41.6 mg/dL

## 2013-09-05 LAB — PSA: PSA: 3.19 ng/mL (ref 0.10–4.00)

## 2013-09-05 MED ORDER — OXYBUTYNIN CHLORIDE 5 MG PO TABS: 5.0000 mg | ORAL_TABLET | ORAL | Status: DC

## 2013-09-05 MED ORDER — RANITIDINE HCL 150 MG PO TABS: ORAL_TABLET | ORAL | Status: DC

## 2013-09-05 MED ORDER — SIMVASTATIN 40 MG PO TABS: 40.0000 mg | ORAL_TABLET | Freq: Every day | ORAL | Status: DC

## 2013-09-05 NOTE — Progress Notes (Signed)
   Subjective:    Patient ID: Matthew House, male    DOB: Jun 21, 1940, 73 y.o.   MRN: [id number]  HPI [name] is a 73 year old married male nonsmoker who comes in today for evaluation of urinary frequency with BPH, reflux esophagitis on Zantac 150 twice a day, hyperlipidemia and Zocor 40 mg daily along with an aspirin tablet.  He states overall his had a good year he's been dieting exercising is lost 15 pounds  He gets routine eye care, dental care, colonoscopy and GI, vaccinations updated by [name],,,,,,,,,, flu shot and Pneumovax 13  Cognitive function normal he exercises daily home health safety reviewed no issues identified. He does have guns in the house,,,,,,,, he's an [employer] ,,,,,,,,,, he does have a health care power of attorney and a living well   Review of Systems     Objective:   Physical Exam  Nursing note and vitals reviewed. Constitutional: He is oriented to person, place, and time. He appears well-developed and well-nourished.  HENT:  Head: Normocephalic and atraumatic.  Right Ear: External ear normal.  Left Ear: External ear normal.  Nose: Nose normal.  Mouth/Throat: Oropharynx is clear and moist.  Eyes: Conjunctivae and EOM are normal. Pupils are equal, round, and reactive to light.  Neck: Normal range of motion. Neck supple. No JVD present. No tracheal deviation present. No thyromegaly present.  Cardiovascular: Normal rate, regular rhythm, normal heart sounds and intact distal pulses.  Exam reveals no gallop and no friction rub.   No murmur heard. Pulmonary/Chest: Effort normal and breath sounds normal. No stridor. No respiratory distress. He has no wheezes. He has no rales. He exhibits no tenderness.  Abdominal: Soft. Bowel sounds are normal. He exhibits no distension and no mass. There is no tenderness. There is no rebound and no guarding.  Genitourinary: Rectum normal and penis normal. Guaiac negative stool. No penile tenderness.  1+ symmetrical BPH nonnodular    Musculoskeletal: Normal range of motion. He exhibits no edema and no tenderness.  Lymphadenopathy:    He has no cervical adenopathy.  Neurological: He is alert and oriented to person, place, and time. He has normal reflexes. No cranial nerve deficit. He exhibits normal muscle tone.  Skin: Skin is warm and dry. No rash noted. No erythema. No pallor.  Scar midline nose from previous Mohs surgery 4 weeks ago  Psychiatric: He has a normal mood and affect. His behavior is normal. Judgment and thought content normal.          Assessment & Plan:  Healthy male  Hyperlipidemia continue Lipitor and aspirin  4 weeks status post Mohs surgery..... nasal cancer  BPH........ continue Ditropan 5 mg daily  Reflux esophagitis continue Zantac 150 twice a day

## 2013-09-05 NOTE — Patient Instructions (Signed)
Continue your current medications  Labs today  Return in one year sooner if any problems

## 2013-09-05 NOTE — Progress Notes (Signed)
Pre visit review using our clinic review tool, if applicable. No additional management support is needed unless otherwise documented below in the visit note. 

## 2013-09-30 ENCOUNTER — Other Ambulatory Visit: Payer: Self-pay | Admitting: Family Medicine

## 2014-01-02 ENCOUNTER — Ambulatory Visit: Payer: Medicare Other | Admitting: Family Medicine

## 2014-02-25 ENCOUNTER — Telehealth: Payer: Self-pay | Admitting: Family Medicine

## 2014-02-25 NOTE — Telephone Encounter (Signed)
Patient Name: Matthew House  DOB: 1940-10-23    Initial Comment caller states he had a shot in his right knee this am - the dr stated he thought his blood looked thin - the dr told him to check with his dr to see if he needed to keep taking his aspirin?Stated his blood looked like water   Nurse Assessment  Nurse: Mallie Mussel, RN, Alveta Heimlich Date/Time Eilene Ghazi Time): 02/25/2014 11:48:31 AM  Confirm and document reason for call. If symptomatic, describe symptoms. ---Caller states that he had a knee injection in his right knee this morning. The doctor mentioned that the blood looked thin. He needs to know if he needs to to continue to take the 81mg  aspirin daily. He denies taking Coumadin or Lovenox. I advised him that I will forward this information to the office and request a call back from either Dr. [name] or his nurse. He verbalized understanding.  Has the patient traveled out of the country within the last 30 days? ---Not Applicable  Does the patient require triage? ---No     Guidelines    Guideline Title Affirmed Question

## 2014-02-26 NOTE — Telephone Encounter (Signed)
Per Dr [name] patient should continue all of his medications as prescribed.  Patient is aware.

## 2014-05-28 ENCOUNTER — Telehealth: Payer: Self-pay | Admitting: Family Medicine

## 2014-05-28 NOTE — Telephone Encounter (Signed)
Matthew House is needing a surgical clearance letter for his knee surgery appointment to be scheduled. X-rays are scheduled in June. He also said to say Hi to Dr. [name] for him please.

## 2014-05-29 NOTE — Telephone Encounter (Signed)
Surgical clearance faxed  

## 2014-05-29 NOTE — Telephone Encounter (Signed)
Left message on machine for patient to fax form or call with name of doctor performing the surgery.

## 2014-08-13 ENCOUNTER — Ambulatory Visit (INDEPENDENT_AMBULATORY_CARE_PROVIDER_SITE_OTHER): Payer: PPO | Admitting: Adult Health

## 2014-08-13 ENCOUNTER — Encounter: Payer: Self-pay | Admitting: Adult Health

## 2014-08-13 VITALS — BP 138/72 | HR 74 | Temp 97.9°F | Ht 71.0 in | Wt 237.0 lb

## 2014-08-13 DIAGNOSIS — M2042 Other hammer toe(s) (acquired), left foot: Secondary | ICD-10-CM

## 2014-08-13 NOTE — Progress Notes (Signed)
Subjective:    Patient ID: Matthew House, male    DOB: [birth year], 74 y.o.   MRN: [id number]  HPI 74 year old pleasant male who presents to the office today for pain in his left foot, more specifically his 4th toe, which is a hammer toe. He endorses having "stabbing pain" coming from the 3rd and 4th toe. He has been taking 500mg  Tylenol which reduces the pain. The pain is only when he sleeps. He has no pain with walking and exercising.   Denies any change in skin color, swelling, or warmth.    Review of Systems  Constitutional: Negative.   Musculoskeletal: Positive for arthralgias. Negative for myalgias, joint swelling and gait problem.  Skin: Negative.   Psychiatric/Behavioral: Positive for sleep disturbance.  All other systems reviewed and are negative.  Past Medical History  Diagnosis Date  . GERD (gastroesophageal reflux disease)   . Rosacea   . Hyperlipidemia   . BPH (benign prostatic hyperplasia)   . S/P tonsillectomy and adenoidectomy   . IBS (irritable bowel syndrome)   . Diverticulosis     Social History   Social History  . Marital Status: Married    Spouse Name: N/A  . Number of Children: N/A  . Years of Education: N/A   Occupational History  . Not on file.   Social History Main Topics  . Smoking status: Former Smoker    Quit date: 01/03/1978  . Smokeless tobacco: Never Used  . Alcohol Use: No  . Drug Use: No  . Sexual Activity: Not on file   Other Topics Concern  . Not on file   Social History Narrative    Past Surgical History  Procedure Laterality Date  . Tonsillectomy and adenoidectomy  1952  . Vasectomy  1992  . Incise and drain abcess  2002    right 3rd finger  . Knee cartilage surgery Right     No family history on file.  No Known Allergies  Current Outpatient Prescriptions on File Prior to Visit  Medication Sig Dispense Refill  . alum & mag hydroxide-simeth (MAALOX/MYLANTA) 200-200-20 MG/5ML suspension Take 15 mLs by mouth  every 6 (six) hours as needed for indigestion or heartburn.    Marland Kitchen aspirin 81 MG tablet Take 81 mg by mouth every evening.     Marland Kitchen doxycycline (MONODOX) 100 MG capsule TAKE (1) CAPSULE DAILY. 90 capsule 3  . oxybutynin (DITROPAN) 5 MG tablet Take 1 tablet (5 mg total) by mouth every morning. 100 tablet 3  . ranitidine (ZANTAC) 150 MG tablet TAKE 1 TABLET TWICE DAILY, AM AND PM. 180 tablet 3  . Saw Palmetto, Serenoa repens, 1000 MG CAPS Take 1 capsule by mouth 2 (two) times daily.     . simvastatin (ZOCOR) 40 MG tablet Take 1 tablet (40 mg total) by mouth at bedtime. 100 tablet 3  . Glucosamine-Chondroitin (MOVE FREE PO) Take 2 tablets by mouth every morning.    . naproxen sodium (ALEVE) 220 MG tablet Take 220 mg by mouth 2 (two) times daily as needed (pain).      No current facility-administered medications on file prior to visit.    BP 138/72 mmHg  Pulse 74  Temp(Src) 97.9 F (36.6 C)  Ht 5\' 11"  (1.803 m)  Wt 237 lb (107.502 kg)  BMI 33.07 kg/m2       Objective:   Physical Exam  Constitutional: He is oriented to person, place, and time. He appears well-developed and well-nourished. No distress.  Musculoskeletal:  Normal range of motion. He exhibits no edema or tenderness.  Hammertoe of 4th toe on bilateral feet  Neurological: He is alert and oriented to person, place, and time.  Skin: Skin is warm and dry. No rash noted. He is not diaphoretic. No erythema. No pallor.  Psychiatric: He has a normal mood and affect. His behavior is normal. Judgment and thought content normal.  Vitals reviewed.      Assessment & Plan:  1. Hammertoe, left - Continue with Tylenol 500mg  at night - Trial hammertoe splints at night - Follow up if no improvement - Consider referral to orthopedics.

## 2014-08-13 NOTE — Patient Instructions (Addendum)
It was great meeting you today.   You can buy splints for Hammertoe at any pharmacy. The ones that we looked at on the Internet were called Yoga Toes. Take Tylenol as needed for pain at night.   Let me know if you would like to see orthopedics.   Hammer Toes Hammer toes is a condition in which one or more of your toes is permanently flexed. CAUSES  This happens when a muscle imbalance or abnormal bone length makes your small toes buckle. This causes the toe joint to contract and the strong cord-like bands that attach muscles to the bones (tendons) in your toes to shorten.  SIGNS AND SYMPTOMS  Common symptoms of flexible hammer toes include:   A buildup of skin cells (corns). Corns occur where boney bumps come in frequent contact with hard surfaces. For example, where your shoes press and rub.  Irritation.  Inflammation.  Pain.  Limited motion in your toes. DIAGNOSIS  Hammer toes are diagnosed through a physical exam of your toes. During the exam, your health care provider may try to reproduce your symptoms by manipulating your foot. Often, X-ray exams are done to determine the degree of deformity and to make sure that the cause is not a fracture.  TREATMENT  Hammer toes can be treated with corrective surgery. There are several types of surgical procedures that can treat hammer toes. The most common procedures include:  Arthroplasty--A portion of the joint is surgically removed and your toe is straightened. The gap fills in with fibrous tissue. This procedure helps treat pain and deformity and helps restore function.  Fusion--Cartilage between the two bones of the affected joint is taken out and the bones fuse together into one longer bone. This helps keep your toe stable and reduces pain but leaves your toe stiff, yet straight.  Implantation--A portion of your bone is removed and replaced with an implant to restore motion.  Flexor tendon transfers--This procedure repositions the  tendons that curl the toes down (flexor tendons). This may be done to release the deforming force that causes your toe to buckle. Several of these procedures require fixing your toe with a pin that is visible at the tip of your toe. The pin keeps the toe straight during healing. Your health care provider will remove the pin usually within 4-8 weeks after the procedure.  Document Released: 12/18/1999 Document Revised: 12/25/2012 Document Reviewed: 08/27/2012 [facility] Patient Information 2015 [place], [place]. This information is not intended to replace advice given to you by your health care provider. Make sure you discuss any questions you have with your health care provider.

## 2014-09-11 ENCOUNTER — Other Ambulatory Visit: Payer: Self-pay | Admitting: Family Medicine

## 2014-09-12 ENCOUNTER — Other Ambulatory Visit: Payer: Self-pay | Admitting: Family Medicine

## 2014-09-12 DIAGNOSIS — R319 Hematuria, unspecified: Secondary | ICD-10-CM

## 2014-09-12 MED ORDER — OXYBUTYNIN CHLORIDE 5 MG PO TABS: 5.0000 mg | ORAL_TABLET | ORAL | Status: DC

## 2014-09-12 NOTE — Telephone Encounter (Signed)
Pt needs refill on oxybutynin 5 mg #90 w/refills send to [facility]

## 2014-09-12 NOTE — Telephone Encounter (Signed)
Rx sent to pharmacy   

## 2014-09-23 ENCOUNTER — Other Ambulatory Visit: Payer: Self-pay | Admitting: Family Medicine

## 2014-09-30 ENCOUNTER — Ambulatory Visit (INDEPENDENT_AMBULATORY_CARE_PROVIDER_SITE_OTHER): Payer: PPO | Admitting: Family Medicine

## 2014-09-30 ENCOUNTER — Telehealth: Payer: Self-pay

## 2014-09-30 ENCOUNTER — Encounter: Payer: Self-pay | Admitting: Family Medicine

## 2014-09-30 VITALS — BP 135/80 | Temp 97.8°F | Ht 71.0 in | Wt 237.8 lb

## 2014-09-30 DIAGNOSIS — K2100 Gastro-esophageal reflux disease with esophagitis, without bleeding: Secondary | ICD-10-CM

## 2014-09-30 DIAGNOSIS — L719 Rosacea, unspecified: Secondary | ICD-10-CM

## 2014-09-30 DIAGNOSIS — R351 Nocturia: Secondary | ICD-10-CM

## 2014-09-30 DIAGNOSIS — E785 Hyperlipidemia, unspecified: Secondary | ICD-10-CM

## 2014-09-30 DIAGNOSIS — R972 Elevated prostate specific antigen [PSA]: Secondary | ICD-10-CM

## 2014-09-30 DIAGNOSIS — N401 Enlarged prostate with lower urinary tract symptoms: Secondary | ICD-10-CM

## 2014-09-30 DIAGNOSIS — R319 Hematuria, unspecified: Secondary | ICD-10-CM

## 2014-09-30 DIAGNOSIS — Z0000 Encounter for general adult medical examination without abnormal findings: Secondary | ICD-10-CM | POA: Diagnosis not present

## 2014-09-30 LAB — CBC WITH DIFFERENTIAL/PLATELET
BASOS PCT: 0.4 % (ref 0.0–3.0)
Basophils Absolute: 0 10*3/uL (ref 0.0–0.1)
Eosinophils Absolute: 0.2 10*3/uL (ref 0.0–0.7)
Eosinophils Relative: 2.8 % (ref 0.0–5.0)
HCT: 39.4 % (ref 39.0–52.0)
Hemoglobin: 13.4 g/dL (ref 13.0–17.0)
Lymphocytes Relative: 30.5 % (ref 12.0–46.0)
Lymphs Abs: 1.8 10*3/uL (ref 0.7–4.0)
MCHC: 34 g/dL (ref 30.0–36.0)
MCV: 83.6 fl (ref 78.0–100.0)
MONO ABS: 0.6 10*3/uL (ref 0.1–1.0)
Monocytes Relative: 9.8 % (ref 3.0–12.0)
NEUTROS PCT: 56.5 % (ref 43.0–77.0)
Neutro Abs: 3.4 10*3/uL (ref 1.4–7.7)
Platelets: 141 10*3/uL — ABNORMAL LOW (ref 150.0–400.0)
RBC: 4.71 Mil/uL (ref 4.22–5.81)
RDW: 14 % (ref 11.5–15.5)
WBC: 5.9 10*3/uL (ref 4.0–10.5)

## 2014-09-30 LAB — POCT URINALYSIS DIPSTICK
Bilirubin, UA: NEGATIVE
Blood, UA: NEGATIVE
Glucose, UA: NEGATIVE
Ketones, UA: NEGATIVE
Leukocytes, UA: NEGATIVE
Nitrite, UA: NEGATIVE
PH UA: 7
PROTEIN UA: NEGATIVE
SPEC GRAV UA: 1.025
Urobilinogen, UA: 0.2

## 2014-09-30 LAB — BASIC METABOLIC PANEL
BUN: 15 mg/dL (ref 6–23)
CALCIUM: 9.8 mg/dL (ref 8.4–10.5)
CHLORIDE: 103 meq/L (ref 96–112)
CO2: 29 mEq/L (ref 19–32)
CREATININE: 0.76 mg/dL (ref 0.40–1.50)
GFR: 106.78 mL/min (ref >60.00)
Glucose, Bld: 92 mg/dL (ref 70–99)
Potassium: 4.5 mEq/L (ref 3.5–5.1)
Sodium: 140 mEq/L (ref 135–145)

## 2014-09-30 LAB — HEPATIC FUNCTION PANEL
ALT: 32 U/L (ref 0–53)
AST: 21 U/L (ref 0–37)
Albumin: 4.5 g/dL (ref 3.5–5.2)
Alkaline Phosphatase: 78 U/L (ref 39–117)
BILIRUBIN DIRECT: 0.1 mg/dL (ref 0.0–0.3)
TOTAL PROTEIN: 7.2 g/dL (ref 6.0–8.3)
Total Bilirubin: 0.8 mg/dL (ref 0.2–1.2)

## 2014-09-30 LAB — LIPID PANEL
CHOLESTEROL: 109 mg/dL (ref 0–200)
HDL: 21.1 mg/dL — ABNORMAL LOW (ref >39.00)
Total CHOL/HDL Ratio: 5

## 2014-09-30 LAB — TSH: TSH: 2.18 u[IU]/mL (ref 0.35–4.50)

## 2014-09-30 LAB — PSA: PSA: 3.33 ng/mL (ref 0.10–4.00)

## 2014-09-30 LAB — LDL CHOLESTEROL, DIRECT: LDL DIRECT: 34 mg/dL

## 2014-09-30 MED ORDER — DOXYCYCLINE MONOHYDRATE 100 MG PO CAPS: ORAL_CAPSULE | ORAL | Status: DC

## 2014-09-30 MED ORDER — SIMVASTATIN 40 MG PO TABS: 40.0000 mg | ORAL_TABLET | Freq: Every day | ORAL | Status: DC

## 2014-09-30 MED ORDER — RANITIDINE HCL 150 MG PO TABS: ORAL_TABLET | ORAL | Status: DC

## 2014-09-30 MED ORDER — OXYBUTYNIN CHLORIDE 5 MG PO TABS: 5.0000 mg | ORAL_TABLET | ORAL | Status: DC

## 2014-09-30 NOTE — Progress Notes (Signed)
Subjective:    Patient ID: Matthew House, male    DOB: 1940/04/21, 74 y.o.   MRN: [id number]  HPI Matthew House is a 74 year old married male nonsmoker who comes in today for annual physical examination because of a history of rosacea, BPH with outlet obstruction, hyperlipidemia  He takes doxycycline 100 mg daily for rosacea  He takes Ditropan 5 mg daily because of BPH and urinary symptoms he also takes saw palmetto 1 capsule twice daily  He takes Zocor 40 mg daily and an aspirin tablet because of hyperlipidemia  He gets routine eye care, dental care, colonoscopy 2012 normal  Vaccinations up-to-date flu shot given today  Cognitive function normal he still works as a Presenter, 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 He has a healthcare power of attorney and living well  Weight 237 pounds BMI is 33. He has difficulty exercising because of a sore knee. He has degenerative joint disease and may need a knee replacement. [facility] [employer] [facility] 4 days a week and does stretching and light weights. Advised that his stationary bike. He also has a hammertoe on the fourth toe of his left foot is been problematic. I'll ask him to see Wylene Simmer for evaluation of that problem. If we can correct that toe may we can get him to do more exercise. Also asked him to call his insurance company and find out where you get the shingles vaccine   Review of Systems  Constitutional: Negative.   HENT: Negative.   Eyes: Negative.   Respiratory: Negative.   Cardiovascular: Negative.   Gastrointestinal: Negative.   Endocrine: Negative.   Genitourinary: Negative.   Musculoskeletal: Negative.   Skin: Negative.   Allergic/Immunologic: Negative.   Neurological: Negative.   Hematological: Negative.   Psychiatric/Behavioral: Negative.        Objective:   Physical Exam  Constitutional: He is oriented to  person, place, and time. He appears well-developed and well-nourished.  HENT:  Head: Normocephalic and atraumatic.  Right Ear: External ear normal.  Left Ear: External ear normal.  Nose: Nose normal.  Mouth/Throat: Oropharynx is clear and moist.  Eyes: Conjunctivae and EOM are normal. Pupils are equal, round, and reactive to light.  Neck: Normal range of motion. Neck supple. No JVD present. No tracheal deviation present. No thyromegaly present.  Cardiovascular: Normal rate, regular rhythm, normal heart sounds and intact distal pulses.  Exam reveals no gallop and no friction rub.   No murmur heard. No carotid nor aortic bruits peripheral pulses 2+ symmetrical  Pulmonary/Chest: Effort normal and breath sounds normal. No stridor. No respiratory distress. He has no wheezes. He has no rales. He exhibits no tenderness.  Abdominal: Soft. Bowel sounds are normal. He exhibits no distension and no mass. There is no tenderness. There is no rebound and no guarding.  Genitourinary: Rectum normal and penis normal. Guaiac negative stool. No penile tenderness.  Normal uncircumcised male  Prostate 2+ symmetrical nonnodular  Musculoskeletal: Normal range of motion. He exhibits no edema or tenderness.  Buddy taped left fourth toe marked hammertoe  Lymphadenopathy:    He has no cervical adenopathy.  Neurological: He is alert and oriented to person, place, and time. He has normal reflexes. No cranial nerve deficit. He exhibits normal muscle tone.  Skin: Skin is warm and dry. No rash noted. No erythema. No pallor.  Total body  skin exam normal  Psychiatric: He has a normal mood and affect. His behavior is normal. Judgment and thought content normal.  Nursing note and vitals reviewed.         Assessment & Plan:  Rosacea....... continue doxycycline  BPH with urinary outlet obstruction symptoms continue Ditropan  Hyperlipidemia continue Zocor and aspirin........ check labs  Hammertoe left fourth  toe......Marland Kitchen refer to Wylene Simmer for further evaluation  Gender degenerative joint disease right knee........ [facility] [employer] [facility] and Motrin 400 twice a day  History of reflux esophagitis............. continue Zantac 150 mg daily

## 2014-09-30 NOTE — Patient Instructions (Signed)
Continue current medications  Consider a stationary bike 30 minutes daily  We will set you up a consult with Dr. Wylene Simmer about your foot  Follow-up in one year for general physical exam sooner if any problems  Matthew House,,,,,,,, our new adult nurse practitioner from [facility]

## 2014-09-30 NOTE — Progress Notes (Signed)
Pre visit review using our clinic review tool, if applicable. No additional management support is needed unless otherwise documented below in the visit note. 

## 2014-10-01 NOTE — Progress Notes (Signed)
   Subjective:    Patient ID: Matthew House, male    DOB: [birth year], 74 y.o.   MRN: [id number]  HPI    Review of Systems     Objective:   Physical Exam        Assessment & Plan:  Ekg was done today at the office visit.  It was interpreted by Dr [name] as normal.

## 2014-10-01 NOTE — Telephone Encounter (Signed)
No Additional notes

## 2014-10-03 ENCOUNTER — Telehealth: Payer: Self-pay | Admitting: *Deleted

## 2014-10-03 ENCOUNTER — Encounter: Payer: Self-pay | Admitting: Family Medicine

## 2014-10-03 DIAGNOSIS — M25572 Pain in left ankle and joints of left foot: Secondary | ICD-10-CM

## 2014-10-03 NOTE — Telephone Encounter (Signed)
Patient is concerned because his plantlet count is a little low.  He would like to know what number is too low and that he has been very tired recently.

## 2014-10-06 NOTE — Telephone Encounter (Signed)
Left message on machine for patient that his platelet count is norma and a low number would be 10000 per Dr [name].

## 2014-10-20 ENCOUNTER — Encounter: Payer: Self-pay | Admitting: Internal Medicine

## 2014-10-21 ENCOUNTER — Encounter: Payer: Self-pay | Admitting: Internal Medicine

## 2015-02-09 ENCOUNTER — Telehealth: Payer: Self-pay | Admitting: Family Medicine

## 2015-02-09 NOTE — Telephone Encounter (Signed)
Pt call to ask if he need to come in and have his Triglycerides check since they were high at his last visit.

## 2015-02-09 NOTE — Telephone Encounter (Signed)
Left message on machine for patient returns our call.  No labs at this time.  Dr [name] will recheck at next appointment.  Exercise and diet for now.

## 2015-02-10 NOTE — Telephone Encounter (Signed)
The patient came in wanting to know if he needed to have labs done, since he hasn't heard from anyone. I looked in the notes and told him that you advised him to exercise and diet until his next visit. He said that you can disregard is message that you don't have to call him back, that I helped him.

## 2015-02-10 NOTE — Telephone Encounter (Signed)
noted 

## 2015-02-17 ENCOUNTER — Ambulatory Visit: Payer: PPO | Admitting: Adult Health

## 2015-02-17 ENCOUNTER — Telehealth: Payer: Self-pay | Admitting: Family Medicine

## 2015-02-17 NOTE — Telephone Encounter (Signed)
Pt call to say he has swelling in his right testicle. Matthew House Dr [name] said send this to you and you will contact the pt.

## 2015-02-17 NOTE — Telephone Encounter (Signed)
Spoke with patient. Per Dr [name] patient should watch and if no improvement he should schedule an appointment.

## 2015-02-18 ENCOUNTER — Ambulatory Visit (INDEPENDENT_AMBULATORY_CARE_PROVIDER_SITE_OTHER): Payer: PPO | Admitting: Adult Health

## 2015-02-18 ENCOUNTER — Encounter: Payer: Self-pay | Admitting: Adult Health

## 2015-02-18 VITALS — BP 130/70 | HR 97 | Temp 97.9°F | Wt 238.4 lb

## 2015-02-18 DIAGNOSIS — N5089 Other specified disorders of the male genital organs: Secondary | ICD-10-CM

## 2015-02-18 MED ORDER — DOXYCYCLINE HYCLATE 100 MG PO CAPS: 100.0000 mg | ORAL_CAPSULE | Freq: Two times a day (BID) | ORAL | Status: DC

## 2015-02-18 NOTE — Progress Notes (Signed)
Subjective:    Patient ID: TIA MAGA, male    DOB: Mar 05, 1940, 75 y.o.   MRN: [id number]  HPI   75 year old male, who presents to the office with an acute issue of swollen right testicle. Per patient he went to bed two nights ago and when he woke up his right testicle was swollen. He does not have pain but " it is like a pressure, ;like someone is squeezing my testicle."   He denies any urinary complaints. Does not have pain with defecation. No new sexual contacts. Denies any trauma.   Review of Systems  Constitutional: Negative.   Cardiovascular: Negative.   Gastrointestinal: Negative.   Genitourinary: Positive for scrotal swelling and testicular pain. Negative for discharge, genital sores and penile pain.  All other systems reviewed and are negative.  Past Medical History  Diagnosis Date  . GERD (gastroesophageal reflux disease)   . Rosacea   . Hyperlipidemia   . BPH (benign prostatic hyperplasia)   . S/P tonsillectomy and adenoidectomy   . IBS (irritable bowel syndrome)   . Diverticulosis     Social History   Social History  . Marital Status: Married    Spouse Name: N/A  . Number of Children: N/A  . Years of Education: N/A   Occupational History  . Not on file.   Social History Main Topics  . Smoking status: Former Smoker    Quit date: 01/03/1978  . Smokeless tobacco: Never Used  . Alcohol Use: No  . Drug Use: No  . Sexual Activity: Not on file   Other Topics Concern  . Not on file   Social History Narrative    Past Surgical History  Procedure Laterality Date  . Tonsillectomy and adenoidectomy  1952  . Vasectomy  1992  . Incise and drain abcess  2002    right 3rd finger  . Knee cartilage surgery Right     No family history on file.  No Known Allergies  Current Outpatient Prescriptions on File Prior to Visit  Medication Sig Dispense Refill  . alum & mag hydroxide-simeth (MAALOX/MYLANTA) 200-200-20 MG/5ML suspension Take 15 mLs by mouth  every 6 (six) hours as needed for indigestion or heartburn.    Marland Kitchen aspirin 81 MG tablet Take 81 mg by mouth every evening.     Marland Kitchen doxycycline (MONODOX) 100 MG capsule TAKE (1) CAPSULE DAILY. 90 capsule 3  . ibuprofen (ADVIL,MOTRIN) 200 MG tablet Take 200 mg by mouth. 2 tabs twice daily with food    . oxybutynin (DITROPAN) 5 MG tablet TAKE 1 TABLET IN THE MORNING. 90 tablet 0  . ranitidine (ZANTAC) 150 MG tablet TAKE 1 TABLET TWICE DAILY, AM AND PM. 180 tablet 3  . Saw Palmetto, Serenoa repens, 1000 MG CAPS Take 1 capsule by mouth 2 (two) times daily.     . simvastatin (ZOCOR) 40 MG tablet Take 1 tablet (40 mg total) by mouth at bedtime. 100 tablet 3   No current facility-administered medications on file prior to visit.    BP 130/70 mmHg  Pulse 97  Temp(Src) 97.9 F (36.6 C) (Oral)  Wt 238 lb 6.4 oz (108.138 kg)       Objective:   Physical Exam  Constitutional: He is oriented to person, place, and time. He appears well-developed and well-nourished. No distress.  Genitourinary: Penis normal. No penile tenderness.  Enlarged right testicle. Swollen and hard what appears to be epididymis. Increased pain with palpation to epididymis. No hard masses felt.  No bruising  Neurological: He is alert and oriented to person, place, and time.  Skin: Skin is warm and dry. No rash noted. He is not diaphoretic. No erythema. No pallor.  Psychiatric: He has a normal mood and affect. His behavior is normal. Judgment and thought content normal.  Nursing note reviewed.      Assessment & Plan:  1. Swollen testicle Epididymitis vs hydrocele vs canncer. Doubt cancer or varicocele. No concern for tortion - doxycycline (VIBRAMYCIN) 100 MG capsule; Take 1 capsule (100 mg total) by mouth 2 (two) times daily.  Dispense: 60 capsule; Refill: 0 - US Scrotum; Future - Follow up in one week - Go to the ER with increased pain

## 2015-02-18 NOTE — Patient Instructions (Addendum)
It was great seeing you again  I have sent in a prescription for Doxycycline. Take this twice a day  Someone will call you to schedule an appointment for an ultrasound  Follow up with me in one week.   Go to the ER with increased pain  Epididymitis Epididymitis is swelling (inflammation) of the epididymis. The epididymis is a cord-like structure that is located along the top and back part of the testicle. It collects and stores sperm from the testicle. This condition can also cause pain and swelling of the testicle and scrotum. Symptoms usually start suddenly (acute epididymitis). Sometimes epididymitis starts gradually and lasts for a while (chronic epididymitis). This type may be harder to treat. CAUSES In men 44 and younger, this condition is usually caused by a bacterial infection or sexually transmitted disease (STD), such as:  Gonorrhea.  Chlamydia.  In men 16 and older who do not have anal sex, this condition is usually caused by bacteria from a blockage or abnormalities in the urinary system. These can result from:  Having a tube placed into the bladder (urinary catheter).  Having an enlarged or inflamed prostate gland.  Having recent urinary tract surgery. In men who have a condition that weakens the body's defense system (immune system), such as HIV, this condition can be caused by:   Other bacteria, including tuberculosis and syphilis.  Viruses.  Fungi. Sometimes this condition occurs without infection. That may happen if urine flows backward into the epididymis after heavy lifting or straining. RISK FACTORS This condition is more likely to develop in men:  Who have unprotected sex with more than one partner.  Who have anal sex.   Who have recently had surgery.   Who have a urinary catheter.  Who have urinary problems.  Who have a suppressed immune system. SYMPTOMS  This condition usually begins suddenly with chills, fever, and pain behind the scrotum  and in the testicle. Other symptoms include:   Swelling of the scrotum, testicle, or both.  Pain whenejaculatingor urinating.  Pain in the back or belly.  Nausea.  Itching and discharge from the penis.  Frequent need to pass urine.  Redness and tenderness of the scrotum. DIAGNOSIS Your health care provider can diagnose this condition based on your symptoms and medical history. Your health care provider will also do a physical exam to ask about your symptoms and check your scrotum and testicle for swelling, pain, and redness. You may also have other tests, including:   Examination of discharge from the penis.  Urine tests for infections, such as STDs.  Your health care provider may test you for other STDs, including HIV. TREATMENT Treatment for this condition depends on the cause. If your condition is caused by a bacterial infection, oral antibiotic medicine may be prescribed. If the bacterial infection has spread to your blood, you may need to receive IV antibiotics. Nonbacterial epididymitis is treated with home care that includes bed rest and elevation of the scrotum. Surgery may be needed to treat:  Bacterial epididymitis that causes pus to build up in the scrotum (abscess).  Chronic epididymitis that has not responded to other treatments. HOME CARE INSTRUCTIONS Medicines  Take over-the-counter and prescription medicines only as told by your health care provider.   If you were prescribed an antibiotic medicine, take it as told by your health care provider. Do not stop taking the antibiotic even if your condition improves. Sexual Activity  If your epididymitis was caused by an STD, avoid sexual activity  until your treatment is complete.  Inform your sexual partner or partners if you test positive for an STD. They may need to be treated.Do not engage in sexual activity with your partner or partners until their treatment is completed. General Instructions  Return  to your normal activities as told by your health care provider. Ask your health care provider what activities are safe for you.  Keep your scrotum elevated and supported while resting. Ask your health care provider if you should wear a scrotal support, such as a jockstrap. Wear it as told by your health care provider.  If directed, apply ice to the affected area:   Put ice in a plastic bag.  Place a towel between your skin and the bag.  Leave the ice on for 20 minutes, 2-3 times per day.  Try taking a sitz bath to help with discomfort. This is a warm water bath that is taken while you are sitting down. The water should only come up to your hips and should cover your buttocks. Do this 3-4 times per day or as told by your health care provider.  Keep all follow-up visits as told by your health care provider. This is important. SEEK MEDICAL CARE IF:   You have a fever.   Your pain medicine is not helping.   Your pain is getting worse.   Your symptoms do not improve within three days.   This information is not intended to replace advice given to you by your health care provider. Make sure you discuss any questions you have with your health care provider.   Document Released: 12/18/1999 Document Revised: 09/10/2014 Document Reviewed: 05/07/2014 Elsevier Interactive Patient Education [employer].

## 2015-02-18 NOTE — Progress Notes (Signed)
Pre visit review using our clinic review tool, if applicable. No additional management support is needed unless otherwise documented below in the visit note. 

## 2015-02-20 ENCOUNTER — Telehealth: Payer: Self-pay | Admitting: Adult Health

## 2015-02-20 ENCOUNTER — Other Ambulatory Visit: Payer: Self-pay | Admitting: Adult Health

## 2015-02-20 ENCOUNTER — Ambulatory Visit
Admission: RE | Admit: 2015-02-20 | Discharge: 2015-02-20 | Disposition: A | Discharge status: Home / Self Care | Payer: PPO | Source: Ambulatory Visit | Attending: Adult Health | Admitting: Adult Health

## 2015-02-20 DIAGNOSIS — N5089 Other specified disorders of the male genital organs: Secondary | ICD-10-CM

## 2015-02-20 DIAGNOSIS — N50811 Right testicular pain: Secondary | ICD-10-CM | POA: Diagnosis not present

## 2015-02-20 NOTE — Telephone Encounter (Signed)
Updated Eulas Post on [place] results. He is positive for epididymitis. Currently treating with Doxycycline. He returns to the office in 4 days and I will give him IM Rocephin at that time.

## 2015-02-23 ENCOUNTER — Telehealth: Payer: Self-pay | Admitting: Family Medicine

## 2015-02-23 NOTE — Telephone Encounter (Signed)
Pt has been sch

## 2015-02-23 NOTE — Telephone Encounter (Signed)
Patient saw Tommi Rumps on 02/18/2015 and patient was sent out for ultrasound done. Patient is suppose to follow up with [facility] on 02/26/2015. Patient would like to follow up with his PCP.

## 2015-02-23 NOTE — Telephone Encounter (Signed)
Okay to schedule

## 2015-02-25 ENCOUNTER — Ambulatory Visit: Payer: PPO | Admitting: Adult Health

## 2015-02-26 ENCOUNTER — Ambulatory Visit: Payer: PPO | Admitting: Adult Health

## 2015-03-02 ENCOUNTER — Ambulatory Visit (INDEPENDENT_AMBULATORY_CARE_PROVIDER_SITE_OTHER): Payer: PPO | Admitting: Family Medicine

## 2015-03-02 ENCOUNTER — Encounter: Payer: Self-pay | Admitting: Family Medicine

## 2015-03-02 VITALS — BP 140/80 | Temp 98.3°F | Wt 240.0 lb

## 2015-03-02 DIAGNOSIS — N451 Epididymitis: Secondary | ICD-10-CM | POA: Diagnosis not present

## 2015-03-02 NOTE — Progress Notes (Signed)
   Subjective:    Patient ID: Matthew House, male    DOB: 28-May-1940, 75 y.o.   MRN: [id number]  HPI Matthew House is a 76 year old married male nonsmoker who comes in today for follow-up of right epididymitis  He was seen here 11 days ago and told he had acute epididymitis. An ultrasound was done which showed right epididymitis  He's been on doxycycline for 10 days and feels almost well. His prescription was for 1 month   Review of Systems    review of systems negative Objective:   Physical Exam  Well-developed well-nourished male no acute distress vital signs stable he is afebrile  Examination of the genitalia shows normal symmetry no swelling some tenderness in right epididymis but no masses no hernias      Assessment & Plan:  Right epididymitis resolving with doxycycline twice a day........... continue doxycycline twice a day until symptoms abate

## 2015-03-02 NOTE — Progress Notes (Signed)
Pre visit review using our clinic review tool, if applicable. No additional management support is needed unless otherwise documented below in the visit note. 

## 2015-03-02 NOTE — Patient Instructions (Signed)
Doxycycline one twice daily until symptoms abate....................Marland Kitchen return when necessary.

## 2015-03-07 ENCOUNTER — Other Ambulatory Visit: Payer: Self-pay | Admitting: Family Medicine

## 2015-07-08 DIAGNOSIS — D1801 Hemangioma of skin and subcutaneous tissue: Secondary | ICD-10-CM | POA: Diagnosis not present

## 2015-07-08 DIAGNOSIS — L821 Other seborrheic keratosis: Secondary | ICD-10-CM | POA: Diagnosis not present

## 2015-07-08 DIAGNOSIS — L814 Other melanin hyperpigmentation: Secondary | ICD-10-CM | POA: Diagnosis not present

## 2015-07-08 DIAGNOSIS — L709 Acne, unspecified: Secondary | ICD-10-CM | POA: Diagnosis not present

## 2015-07-08 DIAGNOSIS — Z85828 Personal history of other malignant neoplasm of skin: Secondary | ICD-10-CM | POA: Diagnosis not present

## 2015-07-08 DIAGNOSIS — L739 Follicular disorder, unspecified: Secondary | ICD-10-CM | POA: Diagnosis not present

## 2015-08-04 DIAGNOSIS — M1711 Unilateral primary osteoarthritis, right knee: Secondary | ICD-10-CM | POA: Diagnosis not present

## 2015-08-07 ENCOUNTER — Encounter: Payer: Self-pay | Admitting: Family Medicine

## 2015-08-07 ENCOUNTER — Other Ambulatory Visit: Payer: Self-pay | Admitting: Internal Medicine

## 2015-08-07 ENCOUNTER — Ambulatory Visit (INDEPENDENT_AMBULATORY_CARE_PROVIDER_SITE_OTHER): Payer: PPO | Admitting: Family Medicine

## 2015-08-07 VITALS — BP 118/74 | HR 75 | Temp 98.1°F | Ht 71.0 in | Wt 240.0 lb

## 2015-08-07 DIAGNOSIS — R3989 Other symptoms and signs involving the genitourinary system: Secondary | ICD-10-CM | POA: Diagnosis not present

## 2015-08-07 DIAGNOSIS — M545 Low back pain, unspecified: Secondary | ICD-10-CM

## 2015-08-07 DIAGNOSIS — N5089 Other specified disorders of the male genital organs: Secondary | ICD-10-CM

## 2015-08-07 LAB — POCT URINALYSIS DIPSTICK
BILIRUBIN UA: NEGATIVE
GLUCOSE UA: NEGATIVE
Ketones, UA: NEGATIVE
Leukocytes, UA: NEGATIVE
Nitrite, UA: NEGATIVE
Protein, UA: NEGATIVE
RBC UA: NEGATIVE
SPEC GRAV UA: 1.01
Urobilinogen, UA: 0.2
pH, UA: 7

## 2015-08-07 NOTE — Progress Notes (Signed)
Pre visit review using our clinic review tool, if applicable. No additional management support is needed unless otherwise documented below in the visit note. 

## 2015-08-07 NOTE — Patient Instructions (Addendum)
BEFORE YOU LEAVE: -low back exercises -follow up: keep follow up with your doctor as scheduled  The urine test was normal.  Do the back exercises 3-4 days per week. Follow up with your doctor in 4 weeks unless resolved.  Follow up with your doctor as planned for your physical.

## 2015-08-07 NOTE — Progress Notes (Signed)
HPI:  Acute visit for 2 issues:  1) pink ring in toilet bowel: -for a few weeks -not in the water and no pink or red urine -he thought it was coming from the urine -no change in chronic urinary symptoms, nv, fevers, malaise  2)Low back pain: -lifting weights -has had for a few days R low back pain, mild, dull, worse with certain movements -no radiation, weakness, numbness, malaise, fevers  ROS: See pertinent positives and negatives per HPI.  Past Medical History:  Diagnosis Date  . BPH (benign prostatic hyperplasia)   . Diverticulosis   . GERD (gastroesophageal reflux disease)   . Hyperlipidemia   . IBS (irritable bowel syndrome)   . Rosacea   . S/P tonsillectomy and adenoidectomy     Past Surgical History:  Procedure Laterality Date  . INCISE AND DRAIN ABCESS  2002   right 3rd finger  . KNEE CARTILAGE SURGERY Right   . TONSILLECTOMY AND ADENOIDECTOMY  1952  . VASECTOMY  1992    No family history on file.  Social History   Social History  . Marital status: Married    Spouse name: N/A  . Number of children: N/A  . Years of education: N/A   Social History Main Topics  . Smoking status: Former Smoker    Quit date: 01/03/1978  . Smokeless tobacco: Never Used  . Alcohol use No  . Drug use: No  . Sexual activity: Not Asked   Other Topics Concern  . None   Social History Narrative  . None     Current Outpatient Prescriptions:  .  alum & mag hydroxide-simeth (MAALOX/MYLANTA) [id number] MG/5ML suspension, Take 15 mLs by mouth every 6 (six) hours as needed for indigestion or heartburn., Disp: , Rfl:  .  Aluminum & Magnesium Hydroxide (LIQUID ANTACID PO), Take by mouth., Disp: , Rfl:  .  aspirin 81 MG tablet, Take 81 mg by mouth every evening. , Disp: , Rfl:  .  doxycycline (VIBRAMYCIN) 100 MG capsule, Take 1 capsule (100 mg total) by mouth 2 (two) times daily., Disp: 60 capsule, Rfl: 0 .  ibuprofen (ADVIL,MOTRIN) 200 MG tablet, Take 200 mg by mouth. 2 tabs  twice daily with food, Disp: , Rfl:  .  oxybutynin (DITROPAN) 5 MG tablet, TAKE 1 TABLET IN THE MORNING., Disp: 90 tablet, Rfl: 0 .  oxybutynin (DITROPAN) 5 MG tablet, TAKE 1 TABLET IN THE MORNING., Disp: 90 tablet, Rfl: 2 .  ranitidine (ZANTAC) 150 MG tablet, TAKE 1 TABLET TWICE DAILY, AM AND PM., Disp: 180 tablet, Rfl: 3 .  Saw Palmetto, Serenoa repens, 1000 MG CAPS, Take 1 capsule by mouth 2 (two) times daily. , Disp: , Rfl:  .  simvastatin (ZOCOR) 40 MG tablet, Take 1 tablet (40 mg total) by mouth at bedtime., Disp: 100 tablet, Rfl: 3  EXAM:  Vitals:   08/07/15 1616  BP: 118/74  Pulse: 75  Temp: 98.1 F (36.7 C)    Body mass index is 33.47 kg/m.  GENERAL: vitals reviewed and listed above, alert, oriented, appears well hydrated and in no acute distress  HEENT: atraumatic, conjunttiva clear, no obvious abnormalities on inspection of external nose and ears  NECK: no obvious masses on inspection  LUNGS: clear to auscultation bilaterally, no wheezes, rales or rhonchi, good air movement  CV: HRRR, no peripheral edema  ABD: BS+, soft, NTTP, no CVA TTP  MS: moves all extremities without noticeable abnormality, TTP R lumbar paraspinal muscles, normal gait, no bony TTP, neg facet  loading, neg SLRT  PSYCH: pleasant and cooperative, no obvious depression or anxiety  ASSESSMENT AND PLAN:  Discussed the following assessment and plan:  Abnormal urine color - Plan: POC Urinalysis Dipstick -suspect bacteria in the toilet bowel as cause of ring in toilet bowel -urine dip normal  Right-sided low back pain without sciatica -suspect muscle strain given reproducible pain on exam, no alarm symptoms or findings -HEP, symptomatic care, follow up in 1 month if persists, sooner if worsening or other concerns  -Patient advised to return or notify a doctor immediately if symptoms worsen or persist or new concerns arise.  Patient Instructions  BEFORE YOU LEAVE: -low back exercises -follow  up: keep follow up with your doctor as scheduled  The urine test was normal.  Do the back exercises 3-4 days per week. Follow up with your doctor in 4 weeks unless resolved.  Follow up with your doctor as planned for your physical.   Lucretia Kern., DO

## 2015-08-28 DIAGNOSIS — M1711 Unilateral primary osteoarthritis, right knee: Secondary | ICD-10-CM | POA: Diagnosis not present

## 2015-08-28 DIAGNOSIS — G8929 Other chronic pain: Secondary | ICD-10-CM | POA: Diagnosis not present

## 2015-10-06 ENCOUNTER — Ambulatory Visit (INDEPENDENT_AMBULATORY_CARE_PROVIDER_SITE_OTHER): Payer: PPO | Admitting: Family Medicine

## 2015-10-06 ENCOUNTER — Encounter: Payer: Self-pay | Admitting: Family Medicine

## 2015-10-06 VITALS — BP 152/78 | HR 72 | Temp 97.8°F | Wt 233.8 lb

## 2015-10-06 DIAGNOSIS — N5089 Other specified disorders of the male genital organs: Secondary | ICD-10-CM

## 2015-10-06 DIAGNOSIS — N401 Enlarged prostate with lower urinary tract symptoms: Secondary | ICD-10-CM

## 2015-10-06 DIAGNOSIS — Z23 Encounter for immunization: Secondary | ICD-10-CM

## 2015-10-06 DIAGNOSIS — K2100 Gastro-esophageal reflux disease with esophagitis, without bleeding: Secondary | ICD-10-CM

## 2015-10-06 DIAGNOSIS — E785 Hyperlipidemia, unspecified: Secondary | ICD-10-CM

## 2015-10-06 DIAGNOSIS — E7800 Pure hypercholesterolemia, unspecified: Secondary | ICD-10-CM

## 2015-10-06 DIAGNOSIS — L719 Rosacea, unspecified: Secondary | ICD-10-CM

## 2015-10-06 DIAGNOSIS — Z0000 Encounter for general adult medical examination without abnormal findings: Secondary | ICD-10-CM

## 2015-10-06 DIAGNOSIS — Z9889 Other specified postprocedural states: Secondary | ICD-10-CM

## 2015-10-06 DIAGNOSIS — R972 Elevated prostate specific antigen [PSA]: Secondary | ICD-10-CM

## 2015-10-06 DIAGNOSIS — R351 Nocturia: Secondary | ICD-10-CM | POA: Diagnosis not present

## 2015-10-06 LAB — CBC WITH DIFFERENTIAL/PLATELET
BASOS ABS: 0 10*3/uL (ref 0.0–0.1)
Basophils Relative: 0.5 % (ref 0.0–3.0)
Eosinophils Absolute: 0.2 10*3/uL (ref 0.0–0.7)
Eosinophils Relative: 3.6 % (ref 0.0–5.0)
HCT: 38.3 % — ABNORMAL LOW (ref 39.0–52.0)
HEMOGLOBIN: 13 g/dL (ref 13.0–17.0)
LYMPHS ABS: 1.9 10*3/uL (ref 0.7–4.0)
Lymphocytes Relative: 31.1 % (ref 12.0–46.0)
MCHC: 34.1 g/dL (ref 30.0–36.0)
MCV: 84.4 fl (ref 78.0–100.0)
MONO ABS: 0.6 10*3/uL (ref 0.1–1.0)
Monocytes Relative: 9.8 % (ref 3.0–12.0)
NEUTROS PCT: 55 % (ref 43.0–77.0)
Neutro Abs: 3.3 10*3/uL (ref 1.4–7.7)
Platelets: 138 10*3/uL — ABNORMAL LOW (ref 150.0–400.0)
RBC: 4.53 Mil/uL (ref 4.22–5.81)
RDW: 14.2 % (ref 11.5–15.5)
WBC: 6 10*3/uL (ref 4.0–10.5)

## 2015-10-06 LAB — BASIC METABOLIC PANEL
BUN: 14 mg/dL (ref 6–23)
CHLORIDE: 103 meq/L (ref 96–112)
CO2: 30 mEq/L (ref 19–32)
Calcium: 9.1 mg/dL (ref 8.4–10.5)
Creatinine, Ser: 0.76 mg/dL (ref 0.40–1.50)
GFR: 106.48 mL/min (ref >60.00)
Glucose, Bld: 92 mg/dL (ref 70–99)
POTASSIUM: 4.3 meq/L (ref 3.5–5.1)
SODIUM: 140 meq/L (ref 135–145)

## 2015-10-06 LAB — LIPID PANEL
CHOLESTEROL: 107 mg/dL (ref 0–200)
HDL: 24.6 mg/dL — AB (ref >39.00)
NonHDL: 82.34
TRIGLYCERIDES: 341 mg/dL — AB (ref 0.0–149.0)
Total CHOL/HDL Ratio: 4
VLDL: 68.2 mg/dL — ABNORMAL HIGH (ref 0.0–40.0)

## 2015-10-06 LAB — HEPATIC FUNCTION PANEL
ALBUMIN: 4.3 g/dL (ref 3.5–5.2)
ALT: 31 U/L (ref 0–53)
AST: 19 U/L (ref 0–37)
Alkaline Phosphatase: 69 U/L (ref 39–117)
Bilirubin, Direct: 0.2 mg/dL (ref 0.0–0.3)
TOTAL PROTEIN: 7.2 g/dL (ref 6.0–8.3)
Total Bilirubin: 0.8 mg/dL (ref 0.2–1.2)

## 2015-10-06 LAB — PSA: PSA: 4.09 ng/mL — AB (ref 0.10–4.00)

## 2015-10-06 LAB — LDL CHOLESTEROL, DIRECT: LDL DIRECT: 33 mg/dL

## 2015-10-06 LAB — TSH: TSH: 3.32 u[IU]/mL (ref 0.35–4.50)

## 2015-10-06 MED ORDER — OXYBUTYNIN CHLORIDE 5 MG PO TABS: 5.0000 mg | ORAL_TABLET | Freq: Every morning | ORAL | 3 refills | Status: DC

## 2015-10-06 MED ORDER — DOXYCYCLINE HYCLATE 100 MG PO CAPS: 100.0000 mg | ORAL_CAPSULE | Freq: Two times a day (BID) | ORAL | 3 refills | Status: DC

## 2015-10-06 MED ORDER — SIMVASTATIN 40 MG PO TABS
40.0000 mg | ORAL_TABLET | Freq: Every day | ORAL | 3 refills | Status: DC
Start: 2015-10-06 — End: 2016-09-03

## 2015-10-06 NOTE — Progress Notes (Signed)
Matthew House is a 75 year old married male nonsmoker who comes in today for general physical examination because of a history of rosacea, BPH with outlet obstruction, mild reflux esophagitis call and hyperlipidemia  He takes Zocor 40 mg daily. He can't take an aspirin because of his history of GI distress with aspirin.  He takes Zantac 150 mg twice a day  He takes Ditropan 5 mg daily because history of urinary incontinence and BPH  He uses Vibramycin 100 mg twice a day for chronic rosacea  He gets routine eye care, dental care, colonoscopy 2012 was normal  He's lost 7 pounds since August by cutting out all the sugar he's walking 3 days a week. 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 Those nights he walks a lot 2.  You systems negative except for pain in his right knee. He sees Dr. Pilar Plate L. Recently had injection. Couple years ago he had cartilage removed.  Vaccinations seasonal flu shot given today he'll call his insurance covering about the shingles vaccine  Physical examination  Vital signs stable is afebrile weight down to 233 pounds. HEENT were negative except for bilateral hearing aids. Neck was supple no adenopathy thyroid normal no carotid bruits cardiopulmonary exam normal abdominal exam normal genitalia normal circumcised male rectum normal stool guaiac-negative prostate 1+ symmetrical nonnodular BPH. Extremities normal skin normal peripheral pulses normal.  Impression  #1 hyperlipidemia............ continue Zocor and check labs  #2 BPH with outlet obstruction......... check PSA... Continue Ditropan  #3 rosacea.......... continue doxycycline  History of reflux esophagitis........ sent take 150 twice a day  #5 overweight.......... continue diet exercise and weight loss  #6 degenerative changes right knee history of torn cartilage with surgical extraction of cartilage 2 years ago.......... now followed by orthopedics

## 2015-10-06 NOTE — Patient Instructions (Signed)
Continue current medications  Labs today...........Marland Kitchen we will call you if there is anything abnormal  Return in one year for general physical exam sooner if any problems  Continue diet and exercise program.

## 2015-10-08 ENCOUNTER — Telehealth: Payer: Self-pay | Admitting: *Deleted

## 2015-10-08 NOTE — Telephone Encounter (Signed)
-----   Message from [name], MD sent at 10/06/2015  3:13 PM EDT ----- Please call labs normal

## 2015-10-12 LAB — POCT URINALYSIS DIPSTICK
Bilirubin, UA: NEGATIVE
Glucose, UA: NEGATIVE
Ketones, UA: NEGATIVE
Leukocytes, UA: NEGATIVE
Nitrite, UA: NEGATIVE
RBC UA: NEGATIVE
SPEC GRAV UA: 1.025
UROBILINOGEN UA: 0.2
pH, UA: 5.5

## 2015-10-13 DIAGNOSIS — H2513 Age-related nuclear cataract, bilateral: Secondary | ICD-10-CM | POA: Diagnosis not present

## 2015-10-13 NOTE — Telephone Encounter (Signed)
Called and left message on personal voicemail that all labs are normal. Informed pt that if there are any question's contact the office.

## 2015-10-14 ENCOUNTER — Other Ambulatory Visit: Payer: Self-pay | Admitting: Family Medicine

## 2015-10-14 DIAGNOSIS — K2100 Gastro-esophageal reflux disease with esophagitis, without bleeding: Secondary | ICD-10-CM

## 2015-10-19 NOTE — Telephone Encounter (Signed)
Pt needs refill on ranitidine 150 mg #180

## 2015-10-20 ENCOUNTER — Other Ambulatory Visit: Payer: Self-pay | Admitting: Emergency Medicine

## 2015-10-20 DIAGNOSIS — K2100 Gastro-esophageal reflux disease with esophagitis, without bleeding: Secondary | ICD-10-CM

## 2015-10-20 MED ORDER — RANITIDINE HCL 150 MG PO TABS: ORAL_TABLET | ORAL | 1 refills | Status: DC

## 2015-12-07 ENCOUNTER — Encounter: Payer: Self-pay | Admitting: Family Medicine

## 2015-12-07 ENCOUNTER — Ambulatory Visit (INDEPENDENT_AMBULATORY_CARE_PROVIDER_SITE_OTHER): Payer: PPO | Admitting: Family Medicine

## 2015-12-07 VITALS — HR 69 | Temp 97.6°F

## 2015-12-07 DIAGNOSIS — B029 Zoster without complications: Secondary | ICD-10-CM

## 2015-12-07 MED ORDER — METHYLPREDNISOLONE 4 MG PO TBPK: ORAL_TABLET | ORAL | 0 refills | Status: DC

## 2015-12-07 MED ORDER — VALACYCLOVIR HCL 1 G PO TABS: 1000.0000 mg | ORAL_TABLET | Freq: Three times a day (TID) | ORAL | 0 refills | Status: DC

## 2015-12-07 NOTE — Progress Notes (Signed)
Pre visit review using our clinic review tool, if applicable. No additional management support is needed unless otherwise documented below in the visit note. Pt declined to weigh 

## 2015-12-07 NOTE — Progress Notes (Signed)
   Subjective:    Patient ID: Matthew House, male    DOB: 08-Apr-1940, 75 y.o.   MRN: [id number]  HPI Here for 3 days of a rash on the left buttock that itches and burns. He just returned from a 2 week trip to [place] which was fun but exhausting.    Review of Systems  Constitutional: Negative.   Respiratory: Negative.   Cardiovascular: Negative.   Skin: Positive for rash.       Objective:   Physical Exam  Constitutional: He appears well-developed and well-nourished.  Cardiovascular: Normal rate, regular rhythm, normal heart sounds and intact distal pulses.   Pulmonary/Chest: Effort normal and breath sounds normal.  Skin:  There is a 10 cm square patch of red vesicles and papules on the left buttock          Assessment & Plan:  This is shingles. Treat with a Medrol dose pack and Valtrex for 10 days. Recheck prn. [name], MD

## 2016-01-06 ENCOUNTER — Telehealth: Payer: Self-pay | Admitting: Family Medicine

## 2016-01-06 ENCOUNTER — Other Ambulatory Visit: Payer: Self-pay | Admitting: Emergency Medicine

## 2016-01-06 NOTE — Telephone Encounter (Signed)
Patient wants to get a printed Rx for Sildenafil 20mg    12 doses

## 2016-01-07 NOTE — Telephone Encounter (Signed)
Patient came by again today to check on this prescription.  He said he will come back tomorrow morning to see if he can pick it up.  I told him someone would call him if it was ready but he insists on coming by.  He is going out of town on Saturday and Sunday.

## 2016-01-07 NOTE — Telephone Encounter (Signed)
Pt would like this medication filled. Please advise

## 2016-01-08 MED ORDER — SILDENAFIL CITRATE 20 MG PO TABS: ORAL_TABLET | ORAL | 0 refills | Status: DC

## 2016-01-08 NOTE — Telephone Encounter (Signed)
done

## 2016-01-08 NOTE — Telephone Encounter (Signed)
Spoke with pt to let him know that his rx was ready for pick up

## 2016-01-08 NOTE — Telephone Encounter (Signed)
This patient came in today wanting to see if he can get this Rx printed.  I routed to [facility] because he has seen Dr. Sarajane Jews before.[email or web address]

## 2016-04-05 DIAGNOSIS — D1801 Hemangioma of skin and subcutaneous tissue: Secondary | ICD-10-CM | POA: Diagnosis not present

## 2016-04-05 DIAGNOSIS — L700 Acne vulgaris: Secondary | ICD-10-CM | POA: Diagnosis not present

## 2016-04-05 DIAGNOSIS — C4441 Basal cell carcinoma of skin of scalp and neck: Secondary | ICD-10-CM | POA: Diagnosis not present

## 2016-04-05 DIAGNOSIS — Z85828 Personal history of other malignant neoplasm of skin: Secondary | ICD-10-CM | POA: Diagnosis not present

## 2016-04-05 DIAGNOSIS — L738 Other specified follicular disorders: Secondary | ICD-10-CM | POA: Diagnosis not present

## 2016-04-05 DIAGNOSIS — L814 Other melanin hyperpigmentation: Secondary | ICD-10-CM | POA: Diagnosis not present

## 2016-04-05 DIAGNOSIS — L711 Rhinophyma: Secondary | ICD-10-CM | POA: Diagnosis not present

## 2016-04-05 DIAGNOSIS — L821 Other seborrheic keratosis: Secondary | ICD-10-CM | POA: Diagnosis not present

## 2016-04-05 DIAGNOSIS — D485 Neoplasm of uncertain behavior of skin: Secondary | ICD-10-CM | POA: Diagnosis not present

## 2016-04-11 ENCOUNTER — Ambulatory Visit (INDEPENDENT_AMBULATORY_CARE_PROVIDER_SITE_OTHER)
Admission: RE | Admit: 2016-04-11 | Discharge: 2016-04-11 | Disposition: A | Discharge status: Home / Self Care | Payer: PPO | Source: Ambulatory Visit | Attending: Family Medicine | Admitting: Family Medicine

## 2016-04-11 ENCOUNTER — Ambulatory Visit (INDEPENDENT_AMBULATORY_CARE_PROVIDER_SITE_OTHER): Payer: PPO | Admitting: Family Medicine

## 2016-04-11 ENCOUNTER — Encounter: Payer: Self-pay | Admitting: Family Medicine

## 2016-04-11 VITALS — BP 138/70 | HR 68 | Temp 98.2°F | Wt 239.8 lb

## 2016-04-11 DIAGNOSIS — R0789 Other chest pain: Secondary | ICD-10-CM

## 2016-04-11 DIAGNOSIS — R0781 Pleurodynia: Secondary | ICD-10-CM

## 2016-04-11 MED ORDER — PREDNISONE 10 MG PO TABS: ORAL_TABLET | ORAL | 0 refills | Status: DC

## 2016-04-11 MED ORDER — TIZANIDINE HCL 2 MG PO CAPS: 2.0000 mg | ORAL_CAPSULE | Freq: Three times a day (TID) | ORAL | 0 refills | Status: DC

## 2016-04-11 NOTE — Patient Instructions (Addendum)
Please go to [facility] - located [address] across the [address] from [place] - in the basement - Hours: 8:30-5:30 PM M-F. Do not need appointment.    Costochondritis Costochondritis is swelling and irritation (inflammation) of the tissue (cartilage) that connects your ribs to your breastbone (sternum). This causes pain in the front of your chest. Usually, the pain:  Starts gradually.  Is in more than one rib. This condition usually goes away on its own over time. Follow these instructions at home:  Do not do anything that makes your pain worse.  If directed, put ice on the painful area:  Put ice in a plastic bag.  Place a towel between your skin and the bag.  Leave the ice on for 20 minutes, 2-3 times a day.  If directed, put heat on the affected area as often as told by your doctor. Use the heat source that your doctor tells you to use, such as a moist heat pack or a heating pad.  Place a towel between your skin and the heat source.  Leave the heat on for 20-30 minutes.  Take off the heat if your skin turns bright red. This is very important if you cannot feel pain, heat, or cold. You may have a greater risk of getting burned.  Take over-the-counter and prescription medicines only as told by your doctor.  Return to your normal activities as told by your doctor. Ask your doctor what activities are safe for you.  Keep all follow-up visits as told by your doctor. This is important. Contact a doctor if:  You have chills or a fever.  Your pain does not go away or it gets worse.  You have a cough that does not go away. Get help right away if:  You are short of breath. This information is not intended to replace advice given to you by your health care provider. Make sure you discuss any questions you have with your health care provider. Document Released: 06/08/2007 Document Revised: 07/10/2015 Document Reviewed: 04/15/2015 Elsevier Interactive Patient  Education  2017 [place] NOW OFFER   [facility] FAST TRACK!!!  SAME DAY Appointments for ACUTE CARE  Such as: Sprains, Injuries, cuts, abrasions, rashes, muscle pain, joint pain, back pain Colds, flu, sore throats, headache, allergies, cough, fever  Ear pain, sinus and eye infections Abdominal pain, nausea, vomiting, diarrhea, upset stomach Animal/insect bites  3 Easy Ways to Schedule: Walk-In Scheduling Call in scheduling Mychart Sign-up: [email or web address]

## 2016-04-11 NOTE — Progress Notes (Signed)
Pre visit review using our clinic review tool, if applicable. No additional management support is needed unless otherwise documented below in the visit note. 

## 2016-04-11 NOTE — Progress Notes (Signed)
Subjective:    Patient ID: Matthew House, male    DOB: 1940-01-26, 76 y.o.   MRN: [id number]  HPI  Matthew House is a 76 year old male who presents today with right sided rib cage pain that started one month ago.  He reports that pain is sharp and rated as a 10 with leaning and movement but notes this as a 1 or 2 if he is sitting up.  Triggered with movement such as leaning or placing pressure on this area.  Alleviated with sitting up and advil. Prior history of herpes zoster in 12/2015; he was evaluated by his dermatologist 3 days ago who did not find any lesions. Treatment with advil has provided benefit for pain however he reports having "bad dreams" with this medication. He denies fever, chills, sweats, chest pain, palpitations, SOB, N/V, reflux,and cough today. He reports having GERD with associated food triggers such as [place] food four days ago and eating out 8 days ago for Easter where he reports increased coughing which has exacerbated this pain.  He can usually avoid food triggers and use ranitidine for symptoms.  He works out at gym 3 times/week with weight lifting. He cannot identify this as a trigger however reports pulling on a machine with about 50 lbs of weight and is unsure if this may have been related to onset of pain  Past Medical History:  Diagnosis Date  . BPH (benign prostatic hyperplasia)   . Diverticulosis   . GERD (gastroesophageal reflux disease)   . Hyperlipidemia   . IBS (irritable bowel syndrome)   . Rosacea   . S/P tonsillectomy and adenoidectomy      Review of Systems  Constitutional: Negative for chills, fatigue and fever.  HENT: Negative for congestion, postnasal drip, rhinorrhea, sinus pain, sinus pressure and sneezing.   Respiratory: Negative for cough, shortness of breath and wheezing.   Cardiovascular: Negative for chest pain and palpitations.  Gastrointestinal: Negative for abdominal pain, constipation, diarrhea, nausea and vomiting.    Musculoskeletal: Negative for myalgias.       Rib cage pain; right side  Skin: Negative for rash.  Neurological: Negative for dizziness, weakness, light-headedness and headaches.   Past Medical History:  Diagnosis Date  . BPH (benign prostatic hyperplasia)   . Diverticulosis   . GERD (gastroesophageal reflux disease)   . Hyperlipidemia   . IBS (irritable bowel syndrome)   . Rosacea   . S/P tonsillectomy and adenoidectomy      Social History   Social History  . Marital status: Married    Spouse name: N/A  . Number of children: N/A  . Years of education: N/A   Occupational History  . Not on file.   Social History Main Topics  . Smoking status: Former Smoker    Quit date: 01/03/1978  . Smokeless tobacco: Never Used  . Alcohol use No  . Drug use: No  . Sexual activity: Not on file   Other Topics Concern  . Not on file   Social History Narrative  . No narrative on file    Past Surgical History:  Procedure Laterality Date  . INCISE AND DRAIN ABCESS  2002   right 3rd finger  . KNEE CARTILAGE SURGERY Right   . TONSILLECTOMY AND ADENOIDECTOMY  1952  . VASECTOMY  1992    No family history on file.  No Known Allergies  Current Outpatient Prescriptions on File Prior to Visit  Medication Sig Dispense Refill  . aspirin 81 MG  tablet Take 81 mg by mouth every evening.     Marland Kitchen doxycycline (VIBRAMYCIN) 100 MG capsule Take 1 capsule (100 mg total) by mouth 2 (two) times daily. 200 capsule 3  . ibuprofen (ADVIL,MOTRIN) 200 MG tablet Take 200 mg by mouth. 2 tabs twice daily with food    . oxybutynin (DITROPAN) 5 MG tablet Take 1 tablet (5 mg total) by mouth every morning. 90 tablet 3  . ranitidine (ZANTAC) 150 MG tablet TAKE 1 TABLET TWICE DAILY, AM AND PM. 180 tablet 1  . Saw Palmetto, Serenoa repens, 1000 MG CAPS Take 1 capsule by mouth 2 (two) times daily.     . simvastatin (ZOCOR) 40 MG tablet Take 1 tablet (40 mg total) by mouth at bedtime. 100 tablet 3   No current  facility-administered medications on file prior to visit.     BP 138/70 (BP Location: Left Arm, Patient Position: Sitting, Cuff Size: Large)   Pulse 68   Temp 98.2 F (36.8 C) (Oral)   Wt 239 lb 12.8 oz (108.8 kg)   SpO2 96%   BMI 33.45 kg/m       Objective:   Physical Exam  Constitutional: He is oriented to person, place, and time. He appears well-developed and well-nourished.  Eyes: Pupils are equal, round, and reactive to light. No scleral icterus.  Neck: Neck supple.  Cardiovascular: Normal rate, regular rhythm and intact distal pulses.   Pulmonary/Chest: Effort normal and breath sounds normal. He has no wheezes. He has no rales.  Abdominal: Soft. Bowel sounds are normal. He exhibits no distension. There is no tenderness. There is no rebound.  Musculoskeletal: He exhibits no edema.  Pain is reproducible with palpation to right side of lower rib cage. No warmth, erythema, or swelling appreciated. Spine with normal alignment and no deformity. No tenderness to vertebral process or paraspinous muscles with palpation.  Lymphadenopathy:    He has no cervical adenopathy.  Neurological: He is alert and oriented to person, place, and time.  Skin: Skin is warm and dry. No rash noted.  Psychiatric: He has a normal mood and affect. His behavior is normal. Judgment and thought content normal.      Assessment & Plan:  1. Musculoskeletal chest pain Pain is reproducible to palpation; likely musculoskeletal in origin; presence of pain for one month with improvement from NSAID; trial of prednisone and zanaflex short term; advised follow up if symptoms do not improve with treatment, worsen, or he develops chills, fever, or SOB. - predniSONE (DELTASONE) 10 MG tablet; Take 4 tablets once daily for 2 days, 3 tabs daily for 2 days, 2 tabs daily for 2 days, 1 tab daily for 2 days.  Dispense: 20 tablet; Refill: 0 - tizanidine (ZANAFLEX) 2 MG capsule; Take 1 capsule (2 mg total) by mouth 3 (three) times  daily.  Dispense: 30 capsule; Refill: 0  2. Rib pain on right side Will order chest X-ray to evaluate for fracture with possible trigger as patient is concerned that working with weights that were too heavy.  We discussed that there is low suspicion for fracture and the risks/benefits of imaging. Patient would like to have an X-ray at this time. - DG Chest 2 View; Future  Follow up with PCP as recommended or sooner if symptoms are not improved as stated above.  Delano Metz, FNP-C

## 2016-05-07 ENCOUNTER — Other Ambulatory Visit: Payer: Self-pay | Admitting: Family Medicine

## 2016-05-07 DIAGNOSIS — K2100 Gastro-esophageal reflux disease with esophagitis, without bleeding: Secondary | ICD-10-CM

## 2016-06-02 DIAGNOSIS — M1711 Unilateral primary osteoarthritis, right knee: Secondary | ICD-10-CM | POA: Diagnosis not present

## 2016-06-06 DIAGNOSIS — C44319 Basal cell carcinoma of skin of other parts of face: Secondary | ICD-10-CM | POA: Diagnosis not present

## 2016-06-20 ENCOUNTER — Ambulatory Visit (INDEPENDENT_AMBULATORY_CARE_PROVIDER_SITE_OTHER): Payer: PPO | Admitting: Family Medicine

## 2016-06-20 ENCOUNTER — Encounter: Payer: Self-pay | Admitting: Family Medicine

## 2016-06-20 VITALS — BP 142/78 | HR 76 | Temp 98.3°F | Ht 71.0 in | Wt 235.0 lb

## 2016-06-20 DIAGNOSIS — L309 Dermatitis, unspecified: Secondary | ICD-10-CM

## 2016-06-20 MED ORDER — TRIAMCINOLONE ACETONIDE 0.1 % EX CREA: 1.0000 "application " | TOPICAL_CREAM | Freq: Two times a day (BID) | CUTANEOUS | 2 refills | Status: DC

## 2016-06-20 NOTE — Progress Notes (Signed)
   Subjective:    Patient ID: Matthew House, male    DOB: 1940-11-30, 76 y.o.   MRN: [id number]  HPI Here for 2 weeks of a rash on the left elbow. It does not have itching or pain.    Review of Systems  Constitutional: Negative.   Respiratory: Negative.   Cardiovascular: Negative.   Skin: Positive for rash.       Objective:   Physical Exam  Constitutional: He appears well-developed and well-nourished.  Cardiovascular: Normal rate, regular rhythm, normal heart sounds and intact distal pulses.   Pulmonary/Chest: Effort normal and breath sounds normal.  Skin:  The left antecubital space has a macular pink scaly patch of skin           Assessment & Plan:  Eczema, treat with Triamcinolone cream prn. He will also get the Shingrix series at his pharmacy.  Alysia Penna, MD

## 2016-06-20 NOTE — Patient Instructions (Signed)
WE NOW OFFER   [facility] FAST TRACK!!!  SAME DAY Appointments for ACUTE CARE  Such as: Sprains, Injuries, cuts, abrasions, rashes, muscle pain, joint pain, back pain Colds, flu, sore throats, headache, allergies, cough, fever  Ear pain, sinus and eye infections Abdominal pain, nausea, vomiting, diarrhea, upset stomach Animal/insect bites  3 Easy Ways to Schedule: Walk-In Scheduling Call in scheduling Mychart Sign-up: [email or web address]         

## 2016-07-05 ENCOUNTER — Telehealth: Payer: Self-pay | Admitting: Family Medicine

## 2016-07-05 DIAGNOSIS — B353 Tinea pedis: Secondary | ICD-10-CM | POA: Diagnosis not present

## 2016-07-05 DIAGNOSIS — L814 Other melanin hyperpigmentation: Secondary | ICD-10-CM | POA: Diagnosis not present

## 2016-07-05 DIAGNOSIS — Z85828 Personal history of other malignant neoplasm of skin: Secondary | ICD-10-CM | POA: Diagnosis not present

## 2016-07-05 DIAGNOSIS — L821 Other seborrheic keratosis: Secondary | ICD-10-CM | POA: Diagnosis not present

## 2016-07-05 DIAGNOSIS — L718 Other rosacea: Secondary | ICD-10-CM | POA: Diagnosis not present

## 2016-07-05 DIAGNOSIS — D1801 Hemangioma of skin and subcutaneous tissue: Secondary | ICD-10-CM | POA: Diagnosis not present

## 2016-07-05 NOTE — Telephone Encounter (Signed)
Pt came into the office today to report that he had a reaction from the Shingrix vaccine.  He reports his left upper arm swelling with redness and fluid.  Also had some nausea.  No fever.  Received vaccine from [facility] at [facility].  Not sure if he should receive the second vaccine.  Will discuss with Dr. [name].

## 2016-08-02 DIAGNOSIS — L718 Other rosacea: Secondary | ICD-10-CM | POA: Diagnosis not present

## 2016-08-08 ENCOUNTER — Other Ambulatory Visit: Payer: Self-pay | Admitting: Family Medicine

## 2016-08-08 DIAGNOSIS — K2100 Gastro-esophageal reflux disease with esophagitis, without bleeding: Secondary | ICD-10-CM

## 2016-09-03 ENCOUNTER — Other Ambulatory Visit: Payer: Self-pay | Admitting: Family Medicine

## 2016-09-03 DIAGNOSIS — E7800 Pure hypercholesterolemia, unspecified: Secondary | ICD-10-CM

## 2016-09-23 ENCOUNTER — Encounter: Payer: Self-pay | Admitting: Family Medicine

## 2016-10-11 ENCOUNTER — Ambulatory Visit (INDEPENDENT_AMBULATORY_CARE_PROVIDER_SITE_OTHER): Payer: PPO | Admitting: Family Medicine

## 2016-10-11 ENCOUNTER — Encounter: Payer: Self-pay | Admitting: Family Medicine

## 2016-10-11 ENCOUNTER — Telehealth: Payer: Self-pay | Admitting: Family Medicine

## 2016-10-11 VITALS — BP 130/80 | HR 81 | Temp 98.1°F | Resp 17 | Ht 71.0 in | Wt 236.1 lb

## 2016-10-11 DIAGNOSIS — K2100 Gastro-esophageal reflux disease with esophagitis, without bleeding: Secondary | ICD-10-CM

## 2016-10-11 DIAGNOSIS — E7800 Pure hypercholesterolemia, unspecified: Secondary | ICD-10-CM | POA: Diagnosis not present

## 2016-10-11 DIAGNOSIS — L719 Rosacea, unspecified: Secondary | ICD-10-CM

## 2016-10-11 DIAGNOSIS — N5089 Other specified disorders of the male genital organs: Secondary | ICD-10-CM | POA: Diagnosis not present

## 2016-10-11 DIAGNOSIS — N401 Enlarged prostate with lower urinary tract symptoms: Secondary | ICD-10-CM

## 2016-10-11 DIAGNOSIS — E785 Hyperlipidemia, unspecified: Secondary | ICD-10-CM

## 2016-10-11 DIAGNOSIS — Z0000 Encounter for general adult medical examination without abnormal findings: Secondary | ICD-10-CM

## 2016-10-11 DIAGNOSIS — R972 Elevated prostate specific antigen [PSA]: Secondary | ICD-10-CM

## 2016-10-11 DIAGNOSIS — R351 Nocturia: Secondary | ICD-10-CM | POA: Diagnosis not present

## 2016-10-11 LAB — HEPATIC FUNCTION PANEL
ALT: 26 U/L (ref 0–53)
AST: 16 U/L (ref 0–37)
Albumin: 4.6 g/dL (ref 3.5–5.2)
Alkaline Phosphatase: 70 U/L (ref 39–117)
BILIRUBIN DIRECT: 0.2 mg/dL (ref 0.0–0.3)
BILIRUBIN TOTAL: 0.9 mg/dL (ref 0.2–1.2)
Total Protein: 7.2 g/dL (ref 6.0–8.3)

## 2016-10-11 LAB — LDL CHOLESTEROL, DIRECT: LDL DIRECT: 36 mg/dL

## 2016-10-11 LAB — CBC WITH DIFFERENTIAL/PLATELET
BASOS PCT: 0.6 % (ref 0.0–3.0)
Basophils Absolute: 0 10*3/uL (ref 0.0–0.1)
EOS PCT: 3.5 % (ref 0.0–5.0)
Eosinophils Absolute: 0.2 10*3/uL (ref 0.0–0.7)
HCT: 38.2 % — ABNORMAL LOW (ref 39.0–52.0)
Hemoglobin: 13 g/dL (ref 13.0–17.0)
LYMPHS ABS: 1.7 10*3/uL (ref 0.7–4.0)
Lymphocytes Relative: 31.5 % (ref 12.0–46.0)
MCHC: 34.1 g/dL (ref 30.0–36.0)
MCV: 85.1 fl (ref 78.0–100.0)
MONO ABS: 0.5 10*3/uL (ref 0.1–1.0)
Monocytes Relative: 9.4 % (ref 3.0–12.0)
NEUTROS ABS: 3 10*3/uL (ref 1.4–7.7)
NEUTROS PCT: 55 % (ref 43.0–77.0)
Platelets: 146 10*3/uL — ABNORMAL LOW (ref 150.0–400.0)
RBC: 4.48 Mil/uL (ref 4.22–5.81)
RDW: 13.7 % (ref 11.5–15.5)
WBC: 5.5 10*3/uL (ref 4.0–10.5)

## 2016-10-11 LAB — POCT URINALYSIS DIPSTICK
Bilirubin, UA: NEGATIVE
Blood, UA: NEGATIVE
Glucose, UA: NEGATIVE
KETONES UA: NEGATIVE
Leukocytes, UA: NEGATIVE
Nitrite, UA: NEGATIVE
PH UA: 6 (ref 5.0–8.0)
PROTEIN UA: NEGATIVE
SPEC GRAV UA: 1.025 (ref 1.010–1.025)
Urobilinogen, UA: 0.2 E.U./dL

## 2016-10-11 LAB — BASIC METABOLIC PANEL
BUN: 13 mg/dL (ref 6–23)
CHLORIDE: 103 meq/L (ref 96–112)
CO2: 27 mEq/L (ref 19–32)
CREATININE: 0.72 mg/dL (ref 0.40–1.50)
Calcium: 9.5 mg/dL (ref 8.4–10.5)
GFR: 113.02 mL/min (ref >60.00)
Glucose, Bld: 95 mg/dL (ref 70–99)
Potassium: 4.2 mEq/L (ref 3.5–5.1)
Sodium: 138 mEq/L (ref 135–145)

## 2016-10-11 LAB — LIPID PANEL
Cholesterol: 86 mg/dL (ref 0–200)
HDL: 22.2 mg/dL — AB (ref >39.00)
NonHDL: 64.12
Total CHOL/HDL Ratio: 4
Triglycerides: 247 mg/dL — ABNORMAL HIGH (ref 0.0–149.0)
VLDL: 49.4 mg/dL — ABNORMAL HIGH (ref 0.0–40.0)

## 2016-10-11 LAB — TSH: TSH: 2.94 u[IU]/mL (ref 0.35–4.50)

## 2016-10-11 LAB — PSA: PSA: 4.3 ng/mL — ABNORMAL HIGH (ref 0.10–4.00)

## 2016-10-11 MED ORDER — OXYBUTYNIN CHLORIDE 5 MG PO TABS: 5.0000 mg | ORAL_TABLET | Freq: Every morning | ORAL | 3 refills | Status: DC

## 2016-10-11 MED ORDER — TRIAMCINOLONE ACETONIDE 0.1 % EX CREA: 1.0000 "application " | TOPICAL_CREAM | Freq: Two times a day (BID) | CUTANEOUS | 2 refills | Status: DC

## 2016-10-11 MED ORDER — DOXYCYCLINE HYCLATE 100 MG PO CAPS: 100.0000 mg | ORAL_CAPSULE | Freq: Two times a day (BID) | ORAL | 3 refills | Status: DC

## 2016-10-11 MED ORDER — RANITIDINE HCL 150 MG PO TABS: ORAL_TABLET | ORAL | 4 refills | Status: DC

## 2016-10-11 NOTE — Telephone Encounter (Signed)
° ° ° °  Pt call to say that a cream was suppose to be called in for him and he contacted the pharmacy and they do not have a rx fo the cream.

## 2016-10-11 NOTE — Patient Instructions (Signed)
Continue current medications  Continue exercise program  Labs today.......... I will call you if there is anything abnormal  Return in one year for general physical exam sooner if any problems

## 2016-10-11 NOTE — Telephone Encounter (Signed)
Left voicemail letting pt know Dr. [name] sent in Rx

## 2016-10-11 NOTE — Progress Notes (Signed)
Matthew House is a 76 year old married male nonsmoker who comes in today for evaluation of the following problems..........Marland Kitchen rosacea, osteoarthritis, BPH, reflux esophagitis, hyperlipidemia  4 rosacea he uses Vibramycin 100 mg twice a day  For osteoarthritis he takes Motrin 200 mg twice a day. His right knee has bone rubbing on bone. He is due to get a total knee replacement next year by Dr. Lorre Nick  He takes Ditropan 5 mg daily because of BPH. Nocturia 1 now good stream  He takes Zantac 150 mg twice a day because of a history of reflux esophagitis. He's asymptomatic  He takes Zocor 40 mg daily along with a baby aspirin because of a history of hyperlipidemia. Labs will be done today  Last year he had a bout of diarrhea that would go away until he stopped using lactose. He's therefore developed a lactase deficiency.  He gets routine eye care, dental care, colonoscopy 2012 was normal  Vaccinations up-to-date........ he had his first shingles vaccine in the spring and has severe swelling of his arm. He was advised not to have a second vaccination. We discussed the pros and cons of that  14 point review of systems reviewed and otherwise negative  EKG was done because of a history of hyperlipidemia. 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 Neck was supple no carotid bruits thyroid normal. Cardiopulmonary exam normal abdominal exam normal genitalia normal uncircumcised male rectum normal stool guaiac-negative prostate smooth  nonnodular 1+ BPH  Extremities normal skin normal peripheral pulses normal  #1 rosacea..... Continue current medications  #2 osteoarthritis...........Marland Kitchen right knee...Marland KitchenMarland KitchenMarland Kitchen total knee replacement anticipated the spring  #3 BPH......... continue Blanch Media.... Check PSA  #4 reflux also got his continue Zantac 150 mg twice a day  #5 hyperlipidemia.... Continue Zocor and aspirin check labs.

## 2016-11-03 ENCOUNTER — Ambulatory Visit (INDEPENDENT_AMBULATORY_CARE_PROVIDER_SITE_OTHER): Payer: PPO | Admitting: *Deleted

## 2016-11-03 DIAGNOSIS — Z23 Encounter for immunization: Secondary | ICD-10-CM | POA: Diagnosis not present

## 2016-11-10 ENCOUNTER — Telehealth: Payer: Self-pay | Admitting: Family Medicine

## 2016-11-10 NOTE — Telephone Encounter (Signed)
The patient had a reaction to the flu shot from November 03, 2016. He would like to notate this in his chart. The symptoms were doubling over abdominal cramping. Prostate pain and leg pain. He took Advil and 24 hours later he was feeling better.   Please advise

## 2016-11-10 NOTE — Telephone Encounter (Signed)
Called pt stated that he had bad stomach cramps and pain in his prostate from what he thought was the high dose flu vaccine, pt was advised to come in and see a doctor but stated that he feels better after taking Advil and does not see the need for an appointment.

## 2016-12-08 ENCOUNTER — Telehealth: Payer: Self-pay | Admitting: *Deleted

## 2016-12-08 NOTE — Telephone Encounter (Signed)
Patient walked into office to discuss Shingrix vaccine. We reviewed Dr. Honor Junes last note advising him not to have second vaccine due to having severe swelling after first vaccine. He also wanted to know if his wife could get shingles from his past shingles infection and if she could get HSV-2 from his shingles infection. Discussed w/ Dorothyann Peng, NP and advised patient that he was no longer contagious and could not give shingles to his wife and that shingles is caused by a different virus than HSV-2, so shingles would not transfer that either. Patient verbalized understanding and had no further needs identified at this time.

## 2017-01-02 ENCOUNTER — Ambulatory Visit: Payer: PPO | Admitting: Family Medicine

## 2017-01-02 ENCOUNTER — Encounter: Payer: Self-pay | Admitting: Family Medicine

## 2017-01-02 VITALS — BP 150/70 | HR 80 | Temp 97.7°F | Wt 235.6 lb

## 2017-01-02 DIAGNOSIS — K120 Recurrent oral aphthae: Secondary | ICD-10-CM | POA: Diagnosis not present

## 2017-01-02 NOTE — Progress Notes (Signed)
   Subjective:    Patient ID: Matthew House, male    DOB: 03/04/1940, 76 y.o.   MRN: [id number]  HPI Here to check his lower lip. A sore spot appeared about 10 days ago and it has been fading away since then. He has a hx of fever blisters.    Review of Systems  Constitutional: Negative.   HENT: Positive for mouth sores. Negative for postnasal drip and sore throat.   Eyes: Negative.   Respiratory: Negative.   Cardiovascular: Negative.        Objective:   Physical Exam  Constitutional: He appears well-developed and well-nourished.  HENT:  Right Ear: External ear normal.  Left Ear: External ear normal.  Nose: Nose normal.  The left inner lower lip has a small ulcerated spot, not tender  Eyes: Conjunctivae are normal.  Neck: Neck supple. No thyromegaly present.  Pulmonary/Chest: Effort normal and breath sounds normal. No respiratory distress. He has no wheezes. He has no rales.  Lymphadenopathy:    He has no cervical adenopathy.          Assessment & Plan:  Aphthous ulcer. I reassured him this is benign and self limited. Recheck prn.  Alysia Penna, MD

## 2017-01-03 NOTE — Progress Notes (Deleted)
Subjective:   Matthew House is a 77 y.o. male who presents for Medicare Annual/Subsequent preventive examination.  Diet Trig 247 BMi 32   Exercise  There are no preventive care reminders to display for this patient.   Colonoscopy 09/2010 - due in 10 years PSA/ 4.30 2 months ago  Had Shingrix 06/22/2016  Last OV 10/2015/ labs on 10/9  There are no preventive care reminders to display for this patient.   Smoking hx; quit 1980 ? AAA     Objective:    Vitals: There were no vitals taken for this visit.  There is no height or weight on file to calculate BMI.  No flowsheet data found.  Tobacco Social History   Tobacco Use  Smoking Status Former Smoker  . Last attempt to quit: 01/03/1978  . Years since quitting: 39.0  Smokeless Tobacco Never Used     Counseling given: Not Answered   Clinical Intake:                       Past Medical History:  Diagnosis Date  . BPH (benign prostatic hyperplasia)   . Diverticulosis   . GERD (gastroesophageal reflux disease)   . Hyperlipidemia   . IBS (irritable bowel syndrome)   . Rosacea   . S/P tonsillectomy and adenoidectomy    Past Surgical History:  Procedure Laterality Date  . INCISE AND DRAIN ABCESS  2002   right 3rd finger  . KNEE CARTILAGE SURGERY Right   . TONSILLECTOMY AND ADENOIDECTOMY  1952  . VASECTOMY  1992   No family history on file. Social History   Socioeconomic History  . Marital status: Married    Spouse name: Not on file  . Number of children: Not on file  . Years of education: Not on file  . Highest education level: Not on file  Social Needs  . Financial resource strain: Not on file  . Food insecurity - worry: Not on file  . Food insecurity - inability: Not on file  . Transportation needs - medical: Not on file  . Transportation needs - non-medical: Not on file  Occupational History  . Not on file  Tobacco Use  . Smoking status: Former Smoker    Last attempt to quit:  01/03/1978    Years since quitting: 39.0  . Smokeless tobacco: Never Used  Substance and Sexual Activity  . Alcohol use: No    Alcohol/week: 0.0 oz  . Drug use: No  . Sexual activity: Not on file  Other Topics Concern  . Not on file  Social History Narrative  . Not on file    Outpatient Encounter Medications as of 01/04/2017  Medication Sig  . aspirin 81 MG tablet Take 81 mg by mouth every evening.   Marland Kitchen doxycycline (VIBRAMYCIN) 100 MG capsule Take 1 capsule (100 mg total) by mouth 2 (two) times daily.  Marland Kitchen oxybutynin (DITROPAN) 5 MG tablet Take 1 tablet (5 mg total) by mouth every morning.  . ranitidine (ZANTAC) 150 MG tablet TAKE ONE TABLET TWICE A DAY IN THE MORNING AND EVENING.  . Saw Palmetto, Serenoa repens, 1000 MG CAPS Take 1 capsule by mouth 2 (two) times daily.   . simvastatin (ZOCOR) 40 MG tablet TAKE ONE TABLET AT BEDTIME.  Marland Kitchen triamcinolone cream (KENALOG) 0.1 % Apply 1 application topically 2 (two) times daily.   No facility-administered encounter medications on file as of 01/04/2017.     Activities of Daily Living In your  present state of health, do you have any difficulty performing the following activities: 10/11/2016  Hearing? Y  Comment helped with hearing aids  Vision? Y  Comment eye doctor wants surgery on left eye  Difficulty concentrating or making decisions? N  Walking or climbing stairs? N  Dressing or bathing? N  Doing errands, shopping? N  Some recent data might be hidden    Patient Care Team: [name], MD as PCP - General   Assessment:   This is a routine wellness examination for Matthew House.  Exercise Activities and Dietary recommendations    Goals    None      Fall Risk Fall Risk  10/11/2016 10/06/2015 02/18/2015 09/05/2013 08/28/2012  Falls in the past year? No No No No No  Number falls in past yr: - - - - -  Risk for fall due to : - - - - -  Risk for fall due to: Comment - - - - -   Is the patient's home free of loose throw rugs in walkways,  pet beds, electrical cords, etc?   {Blank single:19197::"yes","no"}      Grab bars in the bathroom? {Blank single:19197::"yes","no"}      Handrails on the stairs?   {Blank single:19197::"yes","no"}      Adequate lighting?   {Blank single:19197::"yes","no"}  Timed Get Up and Go Performed: ***  Depression Screen PHQ 2/9 Scores 10/11/2016 10/06/2015 02/18/2015 09/05/2013  PHQ - 2 Score 0 0 0 0  PHQ- 9 Score 0 - - -    Cognitive Function        Immunization History  Administered Date(s) Administered  . Influenza Whole 09/19/2007  . Influenza, High Dose Seasonal PF 10/06/2015, 11/03/2016  . Influenza,inj,Quad PF,6+ Mos 10/03/2012, 09/05/2013  . Pneumococcal Conjugate-13 09/05/2013  . Pneumococcal Polysaccharide-23 11/27/2000, 08/05/2008  . Td 11/27/2000  . Tdap 08/04/2011  . Zoster Recombinat (Shingrix) 06/22/2016    Qualifies for Shingles Vaccine? ***  Screening Tests Health Maintenance  Topic Date Due  . COLONOSCOPY  09/27/2020  . TETANUS/TDAP  08/03/2021  . INFLUENZA VACCINE  Completed  . PNA vac Low Risk Adult  Completed   Cancer Screenings: Lung: Low Dose CT Chest recommended if Age 41-80 years, 30 pack-year currently smoking OR have quit w/in 15years. Patient does not qualify.       Plan:   ***  I have personally reviewed and noted the following in the patient's chart:   . Medical and social history . Use of alcohol, tobacco or illicit drugs  . Current medications and supplements . Functional ability and status . Nutritional status . Physical activity . Advanced directives . List of other physicians . Hospitalizations, surgeries, and ER visits in previous 12 months . Vitals . Screenings to include cognitive, depression, and falls . Referrals and appointments  In addition, I have reviewed and discussed with patient certain preventive protocols, quality metrics, and best practice recommendations. A written personalized care plan for preventive services as well  as general preventive health recommendations were provided to patient.     Wynetta Fines, RN  01/03/2017

## 2017-01-04 ENCOUNTER — Ambulatory Visit: Payer: PPO

## 2017-01-26 ENCOUNTER — Ambulatory Visit (INDEPENDENT_AMBULATORY_CARE_PROVIDER_SITE_OTHER): Payer: PPO | Admitting: Internal Medicine

## 2017-01-26 ENCOUNTER — Encounter: Payer: Self-pay | Admitting: Internal Medicine

## 2017-01-26 VITALS — BP 158/86 | HR 78 | Temp 98.2°F | Wt 236.0 lb

## 2017-01-26 DIAGNOSIS — R0789 Other chest pain: Secondary | ICD-10-CM | POA: Diagnosis not present

## 2017-01-26 MED ORDER — PREDNISONE 10 MG PO TABS: ORAL_TABLET | ORAL | 0 refills | Status: DC

## 2017-01-26 MED ORDER — TIZANIDINE HCL 2 MG PO CAPS: 2.0000 mg | ORAL_CAPSULE | Freq: Three times a day (TID) | ORAL | 0 refills | Status: DC

## 2017-01-26 NOTE — Progress Notes (Signed)
Chief Complaint  Patient presents with  . Chest Pain    right rib again    HPI: Matthew House 77 y.o. SDA  PCP NA  Hx fo similar pain last April better with  pred and tizanidine  But twinges off and on since then    This past week had lifted an elderly  Neighbor  200#   Who had  fell on side  And he picked up   With bear  hiug a weighting    No pain right away but then days later after lifitn at gym noted pain right antero chest  Area  Same place as before but last night couldn't sleep much  Thinks he  ? Aggravated    Rib cage   gym and weights 3 x per week.  Last nght didn't sleep  Localized    Right anterolateral  And  Hard to get comfortable  And bad last night and took aleve last night.  ROS: See pertinent positives and negatives per HPI. No sob cough fever gi changes   Past Medical History:  Diagnosis Date  . BPH (benign prostatic hyperplasia)   . Diverticulosis   . GERD (gastroesophageal reflux disease)   . Hyperlipidemia   . IBS (irritable bowel syndrome)   . Rosacea   . S/P tonsillectomy and adenoidectomy     History reviewed. No pertinent family history.  Social History   Socioeconomic History  . Marital status: Married    Spouse name: None  . Number of children: None  . Years of education: None  . Highest education level: None  Social Needs  . Financial resource strain: None  . Food insecurity - worry: None  . Food insecurity - inability: None  . Transportation needs - medical: None  . Transportation needs - non-medical: None  Occupational History  . None  Tobacco Use  . Smoking status: Former Smoker    Last attempt to quit: 01/03/1978    Years since quitting: 39.0  . Smokeless tobacco: Never Used  Substance and Sexual Activity  . Alcohol use: No    Alcohol/week: 0.0 oz  . Drug use: No  . Sexual activity: None  Other Topics Concern  . None  Social History Narrative  . None    Outpatient Medications Prior to Visit  Medication Sig Dispense Refill    . aspirin 81 MG tablet Take 81 mg by mouth every evening.     Marland Kitchen doxycycline (VIBRAMYCIN) 100 MG capsule Take 1 capsule (100 mg total) by mouth 2 (two) times daily. 200 capsule 3  . oxybutynin (DITROPAN) 5 MG tablet Take 1 tablet (5 mg total) by mouth every morning. 90 tablet 3  . ranitidine (ZANTAC) 150 MG tablet TAKE ONE TABLET TWICE A DAY IN THE MORNING AND EVENING. 180 tablet 4  . Saw Palmetto, Serenoa repens, 1000 MG CAPS Take 1 capsule by mouth 2 (two) times daily.     . simvastatin (ZOCOR) 40 MG tablet TAKE ONE TABLET AT BEDTIME. 90 tablet 4  . triamcinolone cream (KENALOG) 0.1 % Apply 1 application topically 2 (two) times daily. 45 g 2   No facility-administered medications prior to visit.      EXAM:  BP (!) 158/86 (BP Location: Right Arm, Patient Position: Sitting, Cuff Size: Normal)   Pulse 78   Temp 98.2 F (36.8 C) (Oral)   Wt 236 lb (107 kg)   SpO2 98%   BMI 32.92 kg/m   Body mass index is 32.92  kg/m.  GENERAL: vitals reviewed and listed above, alert, oriented, appears well hydrated and in no acute distress HEENT: atraumatic, conjunctiva  clear, no obvious abnormalities on inspection of external nose and ears  NECK: no obvious masses on inspection palpation  LUNGS: clear to auscultation bilaterally, no wheezes, rales or rhonchi, good air movement  Tender at antero  Mid clavicular line of t 9? Cc junction  Vs interrib area  No redness  Ng squeeze  Pain  But worse when laying and then arising up from supine.   abd no masses  Soft  CV: HRRR, no clubbing cyanosis or  peripheral edema nl cap refill  MS: moves all extremities without noticeable focal  abnormality PSYCH: pleasant and cooperative, no obvious depression or anxiety  ASSESSMENT AND PLAN:  Discussed the following assessment and plan:  Right-sided chest wall pain - recurrent for 10 mos and wirse recnetly poss from lifting   heavy  see text   - Plan: Ambulatory referral to Sports Medicine  Musculoskeletal  chest pain - Plan: predniSONE (DELTASONE) 10 MG tablet, tizanidine (ZANAFLEX) 2 MG capsule, Ambulatory referral to Sports Medicine Presents like recurrent chest wall pain very localized possibly from previous injury aggravated is probably been bothering him all along but then worse recently see text.  Options discussed because it helped him last time we will refill his medicine of prednisone and tizanidine although caution advised with side effect potential. Referral to sports medicine for further evaluation treatment because they have the option of ultrasound uncertain if he would be helped by injection or other local treatments. He wants to continue weight lifting at the gym.  Which is best for his health. Do not  think he needs a reimaging study at this time. -Patient advised to return or notify health care team  if symptoms worsen ,persist or new concerns arise.  Patient Instructions  I agree this is a recurrence of Chest wall strain    .   Adaptive exercise     Ice and   Antiinflammatory .   And sports.  medicine referral about the costochondral  Joints.   Help   ? If injection  Would help or other.          Standley Brooking. Panosh M.D.

## 2017-01-26 NOTE — Patient Instructions (Addendum)
I agree this is a recurrence of Chest wall strain    .   Adaptive exercise     Ice and   Antiinflammatory .   And sports.  medicine referral about the costochondral  Joints.   Help   ? If injection  Would help or other.

## 2017-02-01 ENCOUNTER — Telehealth: Payer: Self-pay | Admitting: Family Medicine

## 2017-02-01 NOTE — Telephone Encounter (Signed)
Copied from [place]. Topic: Appointment Scheduling - New Patient >> Jan 27, 2017 11:15 AM Carole Binning B wrote: I called the patient to schedule Sports Medicine referral. Awaiting call back. -KE  >> Feb 01, 2017 12:22 PM Cleaster Corin, NT wrote: Pt. Called back said there was no vm left but let him know someone will be returning his call about sports med. Appt. At [facility]. Pt. Is a pt. At [facility] [place] Dr. [name] pt. Pt. Can be reached at [phone]

## 2017-02-03 ENCOUNTER — Ambulatory Visit (INDEPENDENT_AMBULATORY_CARE_PROVIDER_SITE_OTHER): Payer: PPO | Admitting: Sports Medicine

## 2017-02-03 ENCOUNTER — Encounter: Payer: Self-pay | Admitting: Sports Medicine

## 2017-02-03 VITALS — BP 156/82 | HR 71 | Ht 71.0 in | Wt 238.8 lb

## 2017-02-03 DIAGNOSIS — R0781 Pleurodynia: Secondary | ICD-10-CM

## 2017-02-03 DIAGNOSIS — Z9889 Other specified postprocedural states: Secondary | ICD-10-CM | POA: Diagnosis not present

## 2017-02-03 DIAGNOSIS — M9902 Segmental and somatic dysfunction of thoracic region: Secondary | ICD-10-CM

## 2017-02-03 DIAGNOSIS — L719 Rosacea, unspecified: Secondary | ICD-10-CM | POA: Diagnosis not present

## 2017-02-03 DIAGNOSIS — R0789 Other chest pain: Secondary | ICD-10-CM | POA: Insufficient documentation

## 2017-02-03 NOTE — Procedures (Signed)
PROCEDURE NOTE: THERAPEUTIC EXERCISES (97110) 15 minutes spent for Therapeutic exercises as below and as referenced in the AVS. This included exercises focusing on stretching, strengthening, with significant focus on eccentric aspects.  Proper technique shown and discussed handout in great detail with ATC. All questions were discussed and answered.   Long term goals include an improvement in range of motion, strength, endurance as well as avoiding reinjury. Frequency of visits is one time as determined during today's  office visit. Frequency of exercises to be performed is as per handout.  EXERCISES REVIEWED:  Thoracic mobility exercises  Core stabilization exercises

## 2017-02-03 NOTE — Assessment & Plan Note (Signed)
I am happy to discuss this with him further at his follow-up appointment.  He denies any significant clicking or popping at this time but does have ongoing knee complaints.  Once again I am happy to further address these at his follow-up if he is not having persistent severe symptoms but otherwise reports feeling pretty good at this time.
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
## 2017-02-03 NOTE — Assessment & Plan Note (Signed)
Symptoms are consistent with costochondritis following a likely serratus anterior strain she does have a small amount of pain with serratus testing today.  He has a markedly prominent posterior rib and initially reported this is asymptomatic but did note slight soreness after palpation of this area I correlating with osteopathic findings.  We will plan to see how he does with therapeutic exercises and manipulation today.  Low yield with ultrasound and given 100% improvement in his pain control following the steroid and muscle relaxer further therapeutic interventions will be deferred.  4-week follow-up for reevaluation and to ensure good clinical improvement.

## 2017-02-03 NOTE — Patient Instructions (Signed)
Please perform the exercise program that we have prepared for you and gone over in detail on a daily basis.  In addition to the handout you were provided you can access your program through: [email or web address]   Your unique program code is: [email or web address]

## 2017-02-03 NOTE — Procedures (Signed)
PROCEDURE NOTE : OSTEOPATHIC MANIPULATION The decision today to treat with Osteopathic Manipulative Therapy (OMT) was based on physical exam findings. Verbal consent was obtained following a discussion with the patient regarding the of risks, benefits and potential side effects, including an acute pain flare,post manipulation soreness and need for repeat treatments.    Contraindications to OMT reviewed and include: NONE  Manipulation was performed as below: Regions Treated: Ribs Techniques used: HVLA, muscle energy and myofascial release The patient tolerated the treatment well and reported Improved symptoms following treatment today. Patient was given medications, exercises, stretches and lifestyle modifications per AVS and verbally.     OSTEOPATHIC/STRUCTURAL EXAM FINDINGS:    Posterior rib 8 on the right

## 2017-02-23 DIAGNOSIS — M1711 Unilateral primary osteoarthritis, right knee: Secondary | ICD-10-CM | POA: Diagnosis not present

## 2017-02-27 DIAGNOSIS — Z85828 Personal history of other malignant neoplasm of skin: Secondary | ICD-10-CM | POA: Diagnosis not present

## 2017-02-27 DIAGNOSIS — L821 Other seborrheic keratosis: Secondary | ICD-10-CM | POA: Diagnosis not present

## 2017-02-27 DIAGNOSIS — D225 Melanocytic nevi of trunk: Secondary | ICD-10-CM | POA: Diagnosis not present

## 2017-02-27 DIAGNOSIS — L814 Other melanin hyperpigmentation: Secondary | ICD-10-CM | POA: Diagnosis not present

## 2017-03-03 ENCOUNTER — Ambulatory Visit: Payer: PPO | Admitting: Sports Medicine

## 2017-03-13 DIAGNOSIS — L821 Other seborrheic keratosis: Secondary | ICD-10-CM | POA: Diagnosis not present

## 2017-03-30 ENCOUNTER — Other Ambulatory Visit: Payer: Self-pay

## 2017-03-30 ENCOUNTER — Telehealth: Payer: Self-pay | Admitting: Family Medicine

## 2017-03-30 DIAGNOSIS — N5089 Other specified disorders of the male genital organs: Secondary | ICD-10-CM

## 2017-03-30 DIAGNOSIS — L719 Rosacea, unspecified: Secondary | ICD-10-CM

## 2017-03-30 MED ORDER — DOXYCYCLINE HYCLATE 100 MG PO CAPS: 100.0000 mg | ORAL_CAPSULE | Freq: Every day | ORAL | 3 refills | Status: DC

## 2017-03-30 NOTE — Telephone Encounter (Signed)
The patient has his medication pre-packaged and he needs the directions changed to once daily for his Rx: doxycycline (VIBRAMYCIN) 100 MG capsule    He has always just taken one pill daily and is doing just fine with once daily.

## 2017-03-30 NOTE — Telephone Encounter (Signed)
Rx for Doxycycline 100 mg has been change for pt to take it one time daily per patient request.

## 2017-04-06 ENCOUNTER — Emergency Department (HOSPITAL_COMMUNITY): Payer: PPO

## 2017-04-06 ENCOUNTER — Encounter (HOSPITAL_COMMUNITY): Payer: Self-pay | Admitting: Emergency Medicine

## 2017-04-06 ENCOUNTER — Emergency Department (HOSPITAL_COMMUNITY)
Admission: EM | Admit: 2017-04-06 | Discharge: 2017-04-06 | Disposition: A | Discharge status: Home / Self Care | Payer: PPO | Attending: Emergency Medicine | Admitting: Emergency Medicine

## 2017-04-06 DIAGNOSIS — M533 Sacrococcygeal disorders, not elsewhere classified: Secondary | ICD-10-CM | POA: Diagnosis not present

## 2017-04-06 DIAGNOSIS — Z79899 Other long term (current) drug therapy: Secondary | ICD-10-CM | POA: Insufficient documentation

## 2017-04-06 DIAGNOSIS — Y998 Other external cause status: Secondary | ICD-10-CM | POA: Insufficient documentation

## 2017-04-06 DIAGNOSIS — Y939 Activity, unspecified: Secondary | ICD-10-CM | POA: Insufficient documentation

## 2017-04-06 DIAGNOSIS — Y92410 Unspecified street and highway as the place of occurrence of the external cause: Secondary | ICD-10-CM | POA: Insufficient documentation

## 2017-04-06 DIAGNOSIS — Z87891 Personal history of nicotine dependence: Secondary | ICD-10-CM | POA: Insufficient documentation

## 2017-04-06 DIAGNOSIS — Z7982 Long term (current) use of aspirin: Secondary | ICD-10-CM | POA: Insufficient documentation

## 2017-04-06 DIAGNOSIS — S79911A Unspecified injury of right hip, initial encounter: Secondary | ICD-10-CM | POA: Diagnosis not present

## 2017-04-06 DIAGNOSIS — S199XXA Unspecified injury of neck, initial encounter: Secondary | ICD-10-CM | POA: Diagnosis not present

## 2017-04-06 DIAGNOSIS — T148XXA Other injury of unspecified body region, initial encounter: Secondary | ICD-10-CM | POA: Diagnosis not present

## 2017-04-06 DIAGNOSIS — E785 Hyperlipidemia, unspecified: Secondary | ICD-10-CM | POA: Diagnosis not present

## 2017-04-06 DIAGNOSIS — S0990XA Unspecified injury of head, initial encounter: Secondary | ICD-10-CM | POA: Diagnosis not present

## 2017-04-06 DIAGNOSIS — M542 Cervicalgia: Secondary | ICD-10-CM | POA: Diagnosis not present

## 2017-04-06 DIAGNOSIS — M546 Pain in thoracic spine: Secondary | ICD-10-CM | POA: Diagnosis not present

## 2017-04-06 DIAGNOSIS — M25551 Pain in right hip: Secondary | ICD-10-CM | POA: Insufficient documentation

## 2017-04-06 DIAGNOSIS — R52 Pain, unspecified: Secondary | ICD-10-CM | POA: Diagnosis not present

## 2017-04-06 DIAGNOSIS — S3992XA Unspecified injury of lower back, initial encounter: Secondary | ICD-10-CM | POA: Diagnosis not present

## 2017-04-06 MED ORDER — LIDOCAINE 5 % EX PTCH: 1.0000 | MEDICATED_PATCH | CUTANEOUS | 0 refills | Status: DC

## 2017-04-06 MED ORDER — CYCLOBENZAPRINE HCL 5 MG PO TABS: 10.0000 mg | ORAL_TABLET | Freq: Two times a day (BID) | ORAL | 0 refills | Status: DC | PRN

## 2017-04-06 MED ORDER — ACETAMINOPHEN 500 MG PO TABS
1000.0000 mg | ORAL_TABLET | Freq: Once | ORAL | Status: AC
  Administered 2017-04-06: 1000 mg via ORAL
  Filled 2017-04-06: qty 2
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
## 2017-04-06 NOTE — ED Notes (Signed)
Patient able to ambulate independently with limp
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
## 2017-04-07 ENCOUNTER — Ambulatory Visit: Payer: PPO

## 2017-04-10 ENCOUNTER — Encounter: Payer: Self-pay | Admitting: Family Medicine

## 2017-04-10 ENCOUNTER — Ambulatory Visit (INDEPENDENT_AMBULATORY_CARE_PROVIDER_SITE_OTHER): Payer: PPO | Admitting: Family Medicine

## 2017-04-10 DIAGNOSIS — M546 Pain in thoracic spine: Secondary | ICD-10-CM

## 2017-04-10 DIAGNOSIS — M545 Low back pain, unspecified: Secondary | ICD-10-CM

## 2017-04-10 DIAGNOSIS — M25561 Pain in right knee: Secondary | ICD-10-CM | POA: Diagnosis not present

## 2017-04-10 DIAGNOSIS — M542 Cervicalgia: Secondary | ICD-10-CM

## 2017-04-10 MED ORDER — CYCLOBENZAPRINE HCL 5 MG PO TABS: ORAL_TABLET | ORAL | 2 refills | Status: DC
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
## 2017-04-10 NOTE — Patient Instructions (Signed)
Aleve one twice a day  Flexeril 5 mg.........Marland Kitchen 1 at bedtime when necessary  We will get you set up for physical therapy ASAP  Return when necessary

## 2017-04-12 ENCOUNTER — Ambulatory Visit: Payer: PPO | Attending: Family Medicine | Admitting: Physical Therapy

## 2017-04-12 ENCOUNTER — Encounter: Payer: Self-pay | Admitting: Physical Therapy

## 2017-04-12 ENCOUNTER — Telehealth: Payer: Self-pay | Admitting: *Deleted

## 2017-04-12 DIAGNOSIS — R293 Abnormal posture: Secondary | ICD-10-CM | POA: Diagnosis not present

## 2017-04-12 DIAGNOSIS — M545 Low back pain, unspecified: Secondary | ICD-10-CM

## 2017-04-12 DIAGNOSIS — M542 Cervicalgia: Secondary | ICD-10-CM
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
## 2017-04-12 NOTE — Patient Instructions (Signed)
Access Code: [email or web address]  URL: [email or web address]  Date: 04/12/2017  Prepared by: Faustino Congress   Exercises  Seated Cervical Retraction and Rotation - 10 reps - 1 sets - 5-10 sec hold - 2x daily - 7x weekly  Neck Sidebending - 10 reps - 1 sets - 5-10 sec hold - 2x daily - 7x weekly  Supine Chin Tuck - 10 reps - 1 sets - 5 sec hold - 2x daily - 7x weekly  Supine Lower Trunk Rotation - 3 reps - 1 sets - 30 sec hold - 2x daily - 7x weekly  Hooklying Single Knee to Chest - 3 reps - 1 sets - 30 sec hold - 2x daily - 7x weekly

## 2017-04-13 DIAGNOSIS — M21061 Valgus deformity, not elsewhere classified, right knee: Secondary | ICD-10-CM | POA: Diagnosis not present

## 2017-04-13 DIAGNOSIS — G8929 Other chronic pain: Secondary | ICD-10-CM | POA: Diagnosis not present

## 2017-04-13 DIAGNOSIS — S8981XA Other specified injuries of right lower leg, initial encounter: Secondary | ICD-10-CM | POA: Diagnosis not present

## 2017-04-13 DIAGNOSIS — M1711 Unilateral primary osteoarthritis, right knee: Secondary | ICD-10-CM | POA: Diagnosis not present

## 2017-04-14 ENCOUNTER — Encounter: Payer: Self-pay | Admitting: Family Medicine

## 2017-04-17 ENCOUNTER — Encounter: Payer: Self-pay | Admitting: Physical Therapy

## 2017-04-17 ENCOUNTER — Ambulatory Visit: Payer: PPO | Admitting: Physical Therapy

## 2017-04-17 DIAGNOSIS — M545 Low back pain, unspecified: Secondary | ICD-10-CM

## 2017-04-17 DIAGNOSIS — M542 Cervicalgia: Secondary | ICD-10-CM | POA: Diagnosis not present

## 2017-04-17 DIAGNOSIS — R293 Abnormal posture: Secondary | ICD-10-CM

## 2017-04-17 NOTE — Therapy (Signed)
[facility] [address] [place], [place], [address] Phone: [phone]   Fax:  [phone]  Physical Therapy Treatment  Patient Details  Name: Matthew House MRN: [id number] Date of Birth: May 14, 1940 Referring Provider: [name], MD   Encounter Date: 04/17/2017  PT End of Session - 04/17/17 1524    Visit Number  2    Number of Visits  12    Date for PT Re-Evaluation  05/24/17    Authorization Type  [employer] $15 copay    PT Start Time  5093    PT Stop Time  1524    PT Time Calculation (min)  39 min    Activity Tolerance  Patient tolerated treatment well;Patient limited by pain    Behavior During Therapy  [facility] for tasks assessed/performed       Past Medical History:  Diagnosis Date  . BPH (benign prostatic hyperplasia)   . Diverticulosis   . GERD (gastroesophageal reflux disease)   . Hyperlipidemia   . IBS (irritable bowel syndrome)   . Rosacea   . S/P tonsillectomy and adenoidectomy     Past Surgical History:  Procedure Laterality Date  . INCISE AND DRAIN ABCESS  2002   right 3rd finger  . KNEE CARTILAGE SURGERY Right   . TONSILLECTOMY AND ADENOIDECTOMY  1952  . VASECTOMY  1992    There were no vitals filed for this visit.  Subjective Assessment - 04/17/17 1451    Subjective  neck and shoulders feel much better.  "I was skeptical of physical therapy last week, but I'm much better." Had xray of Rt knee which was negative and will just take time to improve.    Patient Stated Goals  improve pain    Currently in Pain?  Yes    Pain Score  5     Pain Location  Neck    Pain Orientation  Right;Posterior    Pain Descriptors / Indicators  Spasm;Aching    Pain Type  Acute pain    Pain Onset  1 to 4 weeks ago    Pain Frequency  Constant    Aggravating Factors   riding in car    Pain Relieving Factors  pain medication, better in AM    Pain Score  0 only pain when having spasms    Pain  Location  Back                       [facility] Adult PT Treatment/Exercise - 04/17/17 1453      Exercises   Exercises  Lumbar;Neck      Neck Exercises: Seated   Cervical Rotation  Both;10 reps 10 sec hold    Lateral Flexion  Both;10 reps 10 sec hold      Neck Exercises: Supine   Neck Retraction  10 reps;5 secs      Lumbar Exercises: Stretches   Single Knee to Chest Stretch  Right;Left;3 reps;30 seconds    Lower Trunk Rotation  3 reps;30 seconds bil      Lumbar Exercises: Aerobic   Nustep  L4 x 8 min                  PT Long Term Goals - 04/12/17 1344      PT LONG TERM GOAL #1   Title  independent with initial HEP    Status  New    Target Date  05/24/17  PT LONG TERM GOAL #2   Title  demonstrate improved postural awareness to decrease exacerbation of symptoms    Status  New    Target Date  05/24/17      PT LONG TERM GOAL #3   Title  improve cervical ROM by 5 degrees all motions for improved function and decreased pain    Status  New    Target Date  05/24/17      PT LONG TERM GOAL #4   Title  report pain < 3/10 in neck and back for improved function and mobility    Status  New    Target Date  05/24/17            Plan - 04/17/17 1524    Clinical Impression Statement  Session today focused on review of HEP, which pt needed min to mod cues for proper form and technique.  Pt frequently talking about other pains outside scope of referral, and encouraged to discuss concerns with MD.    Rehab Potential  Good    PT Frequency  2x / week    PT Duration  6 weeks    PT Treatment/Interventions  ADLs/Self Care Home Management;Cryotherapy;Electrical Stimulation;Ultrasound;Traction;Moist Heat;Gait training;Functional mobility training;Therapeutic activities;Therapeutic exercise;Patient/family education;Manual techniques;Taping;Dry needling    PT Next Visit Plan  add/progress posture and core strengthening as able, manual/modalities/DN PRN for pain     Consulted and Agree with Plan of Care  Patient       Patient will benefit from skilled therapeutic intervention in order to improve the following deficits and impairments:  Pain, Postural dysfunction, Increased fascial restricitons, Increased muscle spasms, Decreased range of motion, Decreased strength, Impaired flexibility, Decreased activity tolerance, Decreased mobility  Visit Diagnosis: Cervicalgia  Acute bilateral low back pain without sciatica  Abnormal posture     Problem List Patient Active Problem List   Diagnosis Date Noted  . Rib pain on right side 02/03/2017  . Routine general medical examination at a health care facility 10/11/2016  . H/O right knee surgery 10/03/2012  . Elevated PSA 08/31/2012  . Hyperlipidemia 04/18/2007  . ESOPHAGITIS, REFLUX 04/18/2007  . BPH associated with nocturia 04/18/2007  . ACNE, ROSACEA 04/18/2007  . HEMATURIA UNSPECIFIED 03/26/2007      Matthew House, PT, DPT 04/17/17 3:27 PM     [facility] [address], [place], [place], [address] Phone: [phone]   Fax:  [phone]  Name: Matthew House MRN: [id number] Date of Birth: Feb 04, 1940

## 2017-04-20 ENCOUNTER — Ambulatory Visit: Payer: PPO | Admitting: Physical Therapy

## 2017-04-20 ENCOUNTER — Encounter: Payer: Self-pay | Admitting: Physical Therapy

## 2017-04-20 DIAGNOSIS — M542 Cervicalgia: Secondary | ICD-10-CM | POA: Diagnosis not present

## 2017-04-20 DIAGNOSIS — M545 Low back pain, unspecified: Secondary | ICD-10-CM

## 2017-04-20 DIAGNOSIS — R293 Abnormal posture: Secondary | ICD-10-CM

## 2017-04-20 NOTE — Therapy (Signed)
[facility] [address], [place], [place], [address] Phone: [phone]   Fax:  [phone]  Physical Therapy Treatment  Patient Details  Name: Matthew House MRN: [id number] Date of Birth: 23-Nov-1940 Referring Provider: [name], MD   Encounter Date: 04/20/2017  PT End of Session - 04/20/17 1515    Visit Number  3    Number of Visits  12    Date for PT Re-Evaluation  05/24/17    Authorization Type  [employer] $15 copay    PT Start Time  1432    PT Stop Time  1527    PT Time Calculation (min)  55 min    Activity Tolerance  Patient tolerated treatment well;Patient limited by pain    Behavior During Therapy  [facility] for tasks assessed/performed       Past Medical History:  Diagnosis Date  . BPH (benign prostatic hyperplasia)   . Diverticulosis   . GERD (gastroesophageal reflux disease)   . Hyperlipidemia   . IBS (irritable bowel syndrome)   . Rosacea   . S/P tonsillectomy and adenoidectomy     Past Surgical History:  Procedure Laterality Date  . INCISE AND DRAIN ABCESS  2002   right 3rd finger  . KNEE CARTILAGE SURGERY Right   . TONSILLECTOMY AND ADENOIDECTOMY  1952  . VASECTOMY  1992    There were no vitals filed for this visit.  Subjective Assessment - 04/20/17 1432    Subjective  tried to go all day yesterday without pain medication, and "I can feel it a little more today."  Stretches are feeling better.    Diagnostic tests  x rays negative of hips, lumbar spine    Patient Stated Goals  improve pain    Currently in Pain?  Yes    Pain Score  6  "without medication"    Pain Location  Neck    Pain Orientation  Right;Posterior    Pain Descriptors / Indicators  Aching;Spasm;Headache    Pain Type  Acute pain    Pain Onset  1 to 4 weeks ago    Pain Frequency  Constant    Aggravating Factors   riding in car    Pain Relieving Factors  pain medication (trying to limit), better in AM     Pain Score  0                       [facility] Adult PT Treatment/Exercise - 04/20/17 1436      Lumbar Exercises: Aerobic   Nustep  L5 x 8 min      Lumbar Exercises: Supine   Other Supine Lumbar Exercises  decompression series x 10 reps each (had to stop with LE portion due to pain at ischial tuberosity on Lt)      Modalities   Modalities  Moist Heat;Electrical Stimulation      Moist Heat Therapy   Number Minutes Moist Heat  15 Minutes    Moist Heat Location  Lumbar Spine      Electrical Stimulation   Electrical Stimulation Location  Lt buttock    Electrical Stimulation Action  IFC    Electrical Stimulation Parameters  to tolerance x 15 min    Electrical Stimulation Goals  Pain      Manual Therapy   Manual therapy comments  use of tennis ball for myofascial release             PT  Education - 04/20/17 1514    Education provided  Yes    Education Details  use of tennis ball for myofascial release    Person(s) Educated  Patient    Methods  Explanation    Comprehension  Verbalized understanding          PT Long Term Goals - 04/12/17 1344      PT LONG TERM GOAL #1   Title  independent with initial HEP    Status  New    Target Date  05/24/17      PT LONG TERM GOAL #2   Title  demonstrate improved postural awareness to decrease exacerbation of symptoms    Status  New    Target Date  05/24/17      PT LONG TERM GOAL #3   Title  improve cervical ROM by 5 degrees all motions for improved function and decreased pain    Status  New    Target Date  05/24/17      PT LONG TERM GOAL #4   Title  report pain < 3/10 in neck and back for improved function and mobility    Status  New    Target Date  05/24/17            Plan - 04/20/17 1515    Clinical Impression Statement  Pt with increased pain with lower extremity decompression series with returning LLE back to hooklying at ischial tuberosity so had to stop exercises after 5 reps.  Instructed in use  of tennis ball for myofascial release as pt with significant muscle tightness.  Overall reports improvements in symptoms, and is demonstrating ability to decrease pain medication use.  Needs frequent redirection throughout session and interrupts PT when trying to explain exercises and recommendations.    Rehab Potential  Good    PT Frequency  2x / week    PT Duration  6 weeks    PT Treatment/Interventions  ADLs/Self Care Home Management;Cryotherapy;Electrical Stimulation;Ultrasound;Traction;Moist Heat;Gait training;Functional mobility training;Therapeutic activities;Therapeutic exercise;Patient/family education;Manual techniques;Taping;Dry needling    PT Next Visit Plan  add/progress posture and core strengthening as able, manual/modalities/DN PRN for pain, assess response to estim    Consulted and Agree with Plan of Care  Patient       Patient will benefit from skilled therapeutic intervention in order to improve the following deficits and impairments:  Pain, Postural dysfunction, Increased fascial restricitons, Increased muscle spasms, Decreased range of motion, Decreased strength, Impaired flexibility, Decreased activity tolerance, Decreased mobility  Visit Diagnosis: Cervicalgia  Acute bilateral low back pain without sciatica  Abnormal posture     Problem List Patient Active Problem List   Diagnosis Date Noted  . Rib pain on right side 02/03/2017  . Routine general medical examination at a health care facility 10/11/2016  . H/O right knee surgery 10/03/2012  . Elevated PSA 08/31/2012  . Hyperlipidemia 04/18/2007  . ESOPHAGITIS, REFLUX 04/18/2007  . BPH associated with nocturia 04/18/2007  . ACNE, ROSACEA 04/18/2007  . HEMATURIA UNSPECIFIED 03/26/2007       [name], PT, DPT 04/20/17 3:18 PM    [facility] [address], [place], [place], [address] Phone: [phone]   Fax:   [phone]  Name: Matthew House MRN: [id number] Date of Birth: June 06, 1940

## 2017-04-24 ENCOUNTER — Encounter: Payer: Self-pay | Admitting: Physical Therapy

## 2017-04-24 ENCOUNTER — Ambulatory Visit: Payer: PPO | Admitting: Physical Therapy

## 2017-04-24 DIAGNOSIS — M542 Cervicalgia: Secondary | ICD-10-CM | POA: Diagnosis not present

## 2017-04-24 DIAGNOSIS — M545 Low back pain, unspecified: Secondary | ICD-10-CM

## 2017-04-24 DIAGNOSIS — R293 Abnormal posture: Secondary | ICD-10-CM

## 2017-04-24 NOTE — Therapy (Signed)
[facility] [address] [place], [place], [address] Phone: [phone]   Fax:  [phone]  Physical Therapy Treatment  Patient Details  Name: Matthew House MRN: [id number] Date of Birth: 1940-04-20 Referring Provider: [name], MD   Encounter Date: 04/24/2017  PT End of Session - 04/24/17 1436    Visit Number  4    Number of Visits  12    Date for PT Re-Evaluation  05/24/17    Authorization Type  [employer] $15 copay    PT Start Time  1436    PT Stop Time  1545    PT Time Calculation (min)  69 min    Activity Tolerance  Patient tolerated treatment well    Behavior During Therapy  [facility] for tasks assessed/performed       Past Medical History:  Diagnosis Date  . BPH (benign prostatic hyperplasia)   . Diverticulosis   . GERD (gastroesophageal reflux disease)   . Hyperlipidemia   . IBS (irritable bowel syndrome)   . Rosacea   . S/P tonsillectomy and adenoidectomy     Past Surgical History:  Procedure Laterality Date  . INCISE AND DRAIN ABCESS  2002   right 3rd finger  . KNEE CARTILAGE SURGERY Right   . TONSILLECTOMY AND ADENOIDECTOMY  1952  . VASECTOMY  1992    There were no vitals filed for this visit.  Subjective Assessment - 04/24/17 1440    Subjective  I was pretty sore after last session: neck to tail bone. I really liked the Estim.     Limitations  Standing;Walking;Sitting    Diagnostic tests  x rays negative of hips, lumbar spine    Patient Stated Goals  improve pain    Currently in Pain?  No/denies                       [facility] Adult PT Treatment/Exercise - 04/24/17 0001      Neck Exercises: Supine   Cervical Rotation  Both;10 reps on medium firmness purple ball.      Lumbar Exercises: Stretches   Single Knee to Chest Stretch  Left;1 rep;30 seconds    Lower Trunk Rotation  -- rocking 10x      Lumbar Exercises: Aerobic   Recumbent Bike  L0-1 6 min PTA  present for status      Lumbar Exercises: Supine   Clam  10 reps    Bent Knee Raise  10 reps;2 seconds    Bridge  5 reps;1 second    Other Supine Lumbar Exercises  decompression breathing F/B TA activation and TA/Glute cocontraction 10x each      Moist Heat Therapy   Number Minutes Moist Heat  15 Minutes    Moist Heat Location  -- Lumbar to post thigh      Electrical Stimulation   Electrical Stimulation Location  Lt buttock    Electrical Stimulation Action  IFC    Electrical Stimulation Parameters  to tolerance in RT sidelying    Electrical Stimulation Goals  Pain      Manual Therapy   Manual therapy comments  Instrument asst glute medius LT tissue gliding                  PT Long Term Goals - 04/12/17 1344      PT LONG TERM GOAL #1   Title  independent with initial HEP    Status  New  Target Date  05/24/17      PT LONG TERM GOAL #2   Title  demonstrate improved postural awareness to decrease exacerbation of symptoms    Status  New    Target Date  05/24/17      PT LONG TERM GOAL #3   Title  improve cervical ROM by 5 degrees all motions for improved function and decreased pain    Status  New    Target Date  05/24/17      PT LONG TERM GOAL #4   Title  report pain < 3/10 in neck and back for improved function and mobility    Status  New    Target Date  05/24/17            Plan - 04/24/17 1442    Clinical Impression Statement  Pt tolerated all exercises: both stretching and strengthening very well today. He was encouraged to not overstretch at home as he has the tendency to do so. He did well with his core stabilization exercises ( level 1) in supine today. He could engage his lower abdominals correctly and stabilize his lumbopelvic area properly. No pain with treatment today.     Rehab Potential  Good    PT Frequency  2x / week    PT Duration  6 weeks    PT Treatment/Interventions  ADLs/Self Care Home Management;Cryotherapy;Electrical  Stimulation;Ultrasound;Traction;Moist Heat;Gait training;Functional mobility training;Therapeutic activities;Therapeutic exercise;Patient/family education;Manual techniques;Taping;Dry needling    PT Next Visit Plan  add/progress posture and core strengthening as able, manual/modalities/DN PRN for pain, assess response to estim    Consulted and Agree with Plan of Care  Patient       Patient will benefit from skilled therapeutic intervention in order to improve the following deficits and impairments:  Pain, Postural dysfunction, Increased fascial restricitons, Increased muscle spasms, Decreased range of motion, Decreased strength, Impaired flexibility, Decreased activity tolerance, Decreased mobility  Visit Diagnosis: Cervicalgia  Acute bilateral low back pain without sciatica  Abnormal posture     Problem List Patient Active Problem List   Diagnosis Date Noted  . Rib pain on right side 02/03/2017  . Routine general medical examination at a health care facility 10/11/2016  . H/O right knee surgery 10/03/2012  . Elevated PSA 08/31/2012  . Hyperlipidemia 04/18/2007  . ESOPHAGITIS, REFLUX 04/18/2007  . BPH associated with nocturia 04/18/2007  . ACNE, ROSACEA 04/18/2007  . HEMATURIA UNSPECIFIED 03/26/2007    Azure Barrales, PTA 04/24/2017, 3:36 PM  [facility] [address] [place], [place], [address] Phone: [phone]   Fax:  [phone]  Name: Matthew House MRN: [id number] Date of Birth: 1940-05-10

## 2017-04-27 ENCOUNTER — Encounter: Payer: Self-pay | Admitting: Physical Therapy

## 2017-04-27 ENCOUNTER — Ambulatory Visit: Payer: PPO | Admitting: Physical Therapy

## 2017-04-27 DIAGNOSIS — M545 Low back pain, unspecified: Secondary | ICD-10-CM

## 2017-04-27 DIAGNOSIS — R293 Abnormal posture: Secondary | ICD-10-CM

## 2017-04-27 DIAGNOSIS — M542 Cervicalgia: Secondary | ICD-10-CM

## 2017-04-27 NOTE — Therapy (Signed)
[facility] [address], [place], [place], [address] Phone: [phone]   Fax:  [phone]  Physical Therapy Treatment  Patient Details  Name: Matthew House MRN: [id number] Date of Birth: 01-Jul-1940 Referring Provider: [name], MD   Encounter Date: 04/27/2017  PT End of Session - 04/27/17 1523    Visit Number  5    Number of Visits  12    Date for PT Re-Evaluation  05/24/17    Authorization Type  [employer] $15 copay    PT Start Time  2992    PT Stop Time  1535    PT Time Calculation (min)  50 min    Activity Tolerance  Patient tolerated treatment well    Behavior During Therapy  [facility] for tasks assessed/performed       Past Medical History:  Diagnosis Date  . BPH (benign prostatic hyperplasia)   . Diverticulosis   . GERD (gastroesophageal reflux disease)   . Hyperlipidemia   . IBS (irritable bowel syndrome)   . Rosacea   . S/P tonsillectomy and adenoidectomy     Past Surgical History:  Procedure Laterality Date  . INCISE AND DRAIN ABCESS  2002   right 3rd finger  . KNEE CARTILAGE SURGERY Right   . TONSILLECTOMY AND ADENOIDECTOMY  1952  . VASECTOMY  1992    There were no vitals filed for this visit.  Subjective Assessment - 04/27/17 1450    Subjective  feels he's doing much better.  neck seems to have more motion and feels cognition is improving.  back is starting to feel much better.    Limitations  Standing;Walking;Sitting    Diagnostic tests  x rays negative of hips, lumbar spine    Patient Stated Goals  improve pain    Currently in Pain?  No/denies                       [facility] Adult PT Treatment/Exercise - 04/27/17 1451      Lumbar Exercises: Stretches   Passive Hamstring Stretch  Left;3 reps;30 seconds    ITB Stretch  Left;3 reps;30 seconds      Lumbar Exercises: Aerobic   Nustep  L5 x 8 min      Lumbar Exercises: Supine   Clam  10 reps    Bridge   10 reps;5 seconds    Other Supine Lumbar Exercises  decompression breathing F/B TA activation and TA/Glute cocontraction 10x each; min glute activation due to increased pain at ischial tuberosity      Moist Heat Therapy   Number Minutes Moist Heat  15 Minutes    Moist Heat Location  Lumbar Spine      Electrical Stimulation   Electrical Stimulation Location  Lt buttock    Electrical Stimulation Action  IFC    Electrical Stimulation Parameters  to tolerance x 15 min    Electrical Stimulation Goals  Pain             PT Education - 04/27/17 1523    Education provided  Yes    Education Details  HS and ITB stretch    Person(s) Educated  Patient    Methods  Explanation;Demonstration;Handout    Comprehension  Verbalized understanding          PT Long Term Goals - 04/12/17 1344      PT LONG TERM GOAL #1   Title  independent with initial HEP  Status  New    Target Date  05/24/17      PT LONG TERM GOAL #2   Title  demonstrate improved postural awareness to decrease exacerbation of symptoms    Status  New    Target Date  05/24/17      PT LONG TERM GOAL #3   Title  improve cervical ROM by 5 degrees all motions for improved function and decreased pain    Status  New    Target Date  05/24/17      PT LONG TERM GOAL #4   Title  report pain < 3/10 in neck and back for improved function and mobility    Status  New    Target Date  05/24/17            Plan - 04/27/17 1524    Clinical Impression Statement  Pt doing well overall and progressing well with PT at this time.  He feels neck pain is nearly resolved so main focus on low back and buttock pain.  Continues to have point specific pain at Lt ischial tuberosity which is aggravated by most glute and hamstring activation.  will cotninue to monitor for improvement.    Rehab Potential  Good    PT Frequency  2x / week    PT Duration  6 weeks    PT Treatment/Interventions  ADLs/Self Care Home  Management;Cryotherapy;Electrical Stimulation;Ultrasound;Traction;Moist Heat;Gait training;Functional mobility training;Therapeutic activities;Therapeutic exercise;Patient/family education;Manual techniques;Taping;Dry needling    PT Next Visit Plan  add/progress posture and core strengthening as able, manual/modalities/DN PRN for pain    Consulted and Agree with Plan of Care  Patient       Patient will benefit from skilled therapeutic intervention in order to improve the following deficits and impairments:  Pain, Postural dysfunction, Increased fascial restricitons, Increased muscle spasms, Decreased range of motion, Decreased strength, Impaired flexibility, Decreased activity tolerance, Decreased mobility  Visit Diagnosis: Cervicalgia  Acute bilateral low back pain without sciatica  Abnormal posture     Problem List Patient Active Problem List   Diagnosis Date Noted  . Rib pain on right side 02/03/2017  . Routine general medical examination at a health care facility 10/11/2016  . H/O right knee surgery 10/03/2012  . Elevated PSA 08/31/2012  . Hyperlipidemia 04/18/2007  . ESOPHAGITIS, REFLUX 04/18/2007  . BPH associated with nocturia 04/18/2007  . ACNE, ROSACEA 04/18/2007  . HEMATURIA UNSPECIFIED 03/26/2007      [name], PT, DPT 04/27/17 3:26 PM    [facility] [address], [place], [place], [address] Phone: [phone]   Fax:  [phone]  Name: Matthew House MRN: [id number] Date of Birth: 31-Aug-1940

## 2017-05-01 ENCOUNTER — Encounter: Payer: Self-pay | Admitting: Physical Therapy

## 2017-05-01 ENCOUNTER — Ambulatory Visit: Payer: PPO | Admitting: Physical Therapy

## 2017-05-01 DIAGNOSIS — M545 Low back pain, unspecified: Secondary | ICD-10-CM

## 2017-05-01 DIAGNOSIS — R293 Abnormal posture: Secondary | ICD-10-CM

## 2017-05-01 DIAGNOSIS — M542 Cervicalgia: Secondary | ICD-10-CM | POA: Diagnosis not present

## 2017-05-01 NOTE — Patient Instructions (Signed)
When working out at the gym: focus on your upper body alignment/posture.   - Lift your heart - Pull shoulders SLIGHTLY back - Keep your abdominals in to keep your back stable.

## 2017-05-01 NOTE — Therapy (Signed)
[facility] [address] [place], [place], [address] Phone: [phone]   Fax:  [phone]  Physical Therapy Treatment  Patient Details  Name: Matthew House MRN: [id number] Date of Birth: 1940/11/21 Referring Provider: [name], MD   Encounter Date: 05/01/2017  PT End of Session - 05/01/17 1449    Visit Number  6    Number of Visits  12    Date for PT Re-Evaluation  05/24/17    Authorization Type  [employer] $15 copay    PT Start Time  0093    PT Stop Time  1550    PT Time Calculation (min)  61 min    Activity Tolerance  Patient tolerated treatment well    Behavior During Therapy  [facility] for tasks assessed/performed       Past Medical History:  Diagnosis Date  . BPH (benign prostatic hyperplasia)   . Diverticulosis   . GERD (gastroesophageal reflux disease)   . Hyperlipidemia   . IBS (irritable bowel syndrome)   . Rosacea   . S/P tonsillectomy and adenoidectomy     Past Surgical History:  Procedure Laterality Date  . INCISE AND DRAIN ABCESS  2002   right 3rd finger  . KNEE CARTILAGE SURGERY Right   . TONSILLECTOMY AND ADENOIDECTOMY  1952  . VASECTOMY  1992    There were no vitals filed for this visit.  Subjective Assessment - 05/01/17 1453    Subjective  I may have walked too much yesterday which is why my glutes were hurting more. Worked out at gym this AM, My upper traps are sore.    Limitations  Standing;Walking;Sitting    Diagnostic tests  x rays negative of hips, lumbar spine    Currently in Pain?  Yes    Pain Score  -- central LB and hip. LB 6/10, Hip 6/10    Pain Orientation  Left;Lower    Pain Descriptors / Indicators  Aching;Tightness;Sore    Aggravating Factors   over did my activites maybe?    Pain Relieving Factors  stretching    Multiple Pain Sites  No                       [facility] Adult PT Treatment/Exercise - 05/01/17 0001      Neck Exercises:  Seated   Other Seated Exercise  red band horizontal abd with focus on Upper body corrected alignment 2x6 of each direction      Moist Heat Therapy   Number Minutes Moist Heat  15 Minutes    Moist Heat Location  Lumbar Spine      Electrical Stimulation   Electrical Stimulation Location  LT lumbar to buttocks    Electrical Stimulation Action  IFC    Electrical Stimulation Parameters  to tolerance in sidelying with pillows bt knees    Electrical Stimulation Goals  Pain      Manual Therapy   Manual therapy comments  Instrument asst glute medius,LT ITband and QL. LT tissue gliding 3 min total             PT Education - 05/01/17 1621    Education provided  Yes    Education Details  How to organize his upper body into more of a neutral posture prior to doinghis exercises at the gym.     Person(s) Educated  Patient    Methods  Explanation;Demonstration;Handout    Comprehension  Returned demonstration;Verbalized understanding  PT Long Term Goals - 04/12/17 1344      PT LONG TERM GOAL #1   Title  independent with initial HEP    Status  New    Target Date  05/24/17      PT LONG TERM GOAL #2   Title  demonstrate improved postural awareness to decrease exacerbation of symptoms    Status  New    Target Date  05/24/17      PT LONG TERM GOAL #3   Title  improve cervical ROM by 5 degrees all motions for improved function and decreased pain    Status  New    Target Date  05/24/17      PT LONG TERM GOAL #4   Title  report pain < 3/10 in neck and back for improved function and mobility    Status  New    Target Date  05/24/17            Plan - 05/01/17 1619    Clinical Impression Statement  Pt presented with Lt low back pain and Lt hip pain that is not constant. He continues to be complinat with his HEP and goes to the gym regularly. Pain symptoms may increase if he "overdoes" but mainly he feel relief after stretching. He reported today he is thinking of asking  his MD for a neurologic consult. Treatment focused mainly on helping pt with his postural awareness when working out at the gym.      Rehab Potential  Good    PT Frequency  2x / week    PT Duration  6 weeks    PT Treatment/Interventions  ADLs/Self Care Home Management;Cryotherapy;Electrical Stimulation;Ultrasound;Traction;Moist Heat;Gait training;Functional mobility training;Therapeutic activities;Therapeutic exercise;Patient/family education;Manual techniques;Taping;Dry needling    PT Next Visit Plan  add/progress posture and core strengthening as able, manual/modalities/DN PRN for pain    Consulted and Agree with Plan of Care  Patient       Patient will benefit from skilled therapeutic intervention in order to improve the following deficits and impairments:  Pain, Postural dysfunction, Increased fascial restricitons, Increased muscle spasms, Decreased range of motion, Decreased strength, Impaired flexibility, Decreased activity tolerance, Decreased mobility  Visit Diagnosis: Cervicalgia  Acute bilateral low back pain without sciatica  Abnormal posture     Problem List Patient Active Problem List   Diagnosis Date Noted  . Rib pain on right side 02/03/2017  . Routine general medical examination at a health care facility 10/11/2016  . H/O right knee surgery 10/03/2012  . Elevated PSA 08/31/2012  . Hyperlipidemia 04/18/2007  . ESOPHAGITIS, REFLUX 04/18/2007  . BPH associated with nocturia 04/18/2007  . ACNE, ROSACEA 04/18/2007  . HEMATURIA UNSPECIFIED 03/26/2007    Arin Vanosdol, PTA 05/01/2017, 4:25 PM  [facility] [address] [place], [place], [address] Phone: [phone]   Fax:  [phone]  Name: MOMEN HAM MRN: [id number] Date of Birth: 1940-06-19

## 2017-05-02 ENCOUNTER — Telehealth: Payer: Self-pay | Admitting: Family Medicine

## 2017-05-02 NOTE — Telephone Encounter (Signed)
Pt would like to see if he could get a referral to a Neurologist and to have a ultrasound of the hip by a Sports Medicine Doctor.  Pt state that he is have tingling in the L forearm and sharp L shoulder pains.  If you have any question please give pt a call to elaborate more.

## 2017-05-03 ENCOUNTER — Other Ambulatory Visit: Payer: Self-pay | Admitting: Family Medicine

## 2017-05-03 ENCOUNTER — Encounter: Payer: Self-pay | Admitting: Neurology

## 2017-05-03 DIAGNOSIS — R202 Paresthesia of skin: Principal | ICD-10-CM

## 2017-05-03 DIAGNOSIS — R200 Anesthesia of skin: Secondary | ICD-10-CM

## 2017-05-03 NOTE — Telephone Encounter (Signed)
A referral has been place with a neurologist per pt request.

## 2017-05-04 ENCOUNTER — Encounter: Payer: Self-pay | Admitting: Physical Therapy

## 2017-05-04 ENCOUNTER — Ambulatory Visit: Payer: PPO | Attending: Family Medicine | Admitting: Physical Therapy

## 2017-05-04 DIAGNOSIS — R293 Abnormal posture: Secondary | ICD-10-CM | POA: Insufficient documentation

## 2017-05-04 DIAGNOSIS — M545 Low back pain, unspecified: Secondary | ICD-10-CM

## 2017-05-04 DIAGNOSIS — M542 Cervicalgia: Secondary | ICD-10-CM | POA: Diagnosis not present

## 2017-05-04 NOTE — Therapy (Signed)
[facility] [address] [place], [place], [address] Phone: [phone]   Fax:  [phone]  Physical Therapy Treatment  Patient Details  Name: Matthew House MRN: [id number] Date of Birth: August 14, 1940 Referring Provider: [name], MD   Encounter Date: 05/04/2017  PT End of Session - 05/04/17 1519    Visit Number  7    Number of Visits  12    Date for PT Re-Evaluation  05/24/17    Authorization Type  [employer] $15 copay    PT Start Time  7782    PT Stop Time  1533    PT Time Calculation (min)  48 min    Activity Tolerance  Patient tolerated treatment well    Behavior During Therapy  [facility] for tasks assessed/performed       Past Medical History:  Diagnosis Date  . BPH (benign prostatic hyperplasia)   . Diverticulosis   . GERD (gastroesophageal reflux disease)   . Hyperlipidemia   . IBS (irritable bowel syndrome)   . Rosacea   . S/P tonsillectomy and adenoidectomy     Past Surgical History:  Procedure Laterality Date  . INCISE AND DRAIN ABCESS  2002   right 3rd finger  . KNEE CARTILAGE SURGERY Right   . TONSILLECTOMY AND ADENOIDECTOMY  1952  . VASECTOMY  1992    There were no vitals filed for this visit.  Subjective Assessment - 05/04/17 1446    Subjective  "I overdid it last week at the gym."  has gone back to no weight at gym and will gradually increase weight.  going to neurologist due to some tingling in LUE    Limitations  Standing;Walking;Sitting    Diagnostic tests  x rays negative of hips, lumbar spine    Patient Stated Goals  improve pain    Currently in Pain?  Yes    Pain Score  3     Pain Location  Hip    Pain Orientation  Left    Pain Descriptors / Indicators  Aching;Tightness;Sore    Pain Type  Acute pain    Pain Onset  1 to 4 weeks ago    Pain Frequency  Constant    Aggravating Factors   overdoing it at the gym    Pain Relieving Factors  stretching                        [facility] Adult PT Treatment/Exercise - 05/04/17 1449      Neck Exercises: Theraband   Shoulder External Rotation  Red;15 reps supine on foam roll    Horizontal ABduction  Red;15 reps supine on foam roll    Other Theraband Exercises  overhead pull with red theraband x 15 reps; supine on foam roll      Lumbar Exercises: Aerobic   Nustep  L5 x 8 min      Knee/Hip Exercises: Standing   Hip Abduction  Both;10 reps;Knee straight    Hip Extension  Both;10 reps;Knee straight      Moist Heat Therapy   Number Minutes Moist Heat  15 Minutes    Moist Heat Location  Lumbar Spine;Shoulder      Electrical Stimulation   Electrical Stimulation Location  LT lumbar to buttocks    Electrical Stimulation Action  IFC    Electrical Stimulation Parameters  to tolerance x 15    Electrical Stimulation Goals  Pain      Neck  Exercises: Stretches   Other Neck Stretches  supine chest stretch on foam roll x 3 min    Other Neck Stretches  Lt cross body stretch 3x20 sec                  PT Long Term Goals - 04/12/17 1344      PT LONG TERM GOAL #1   Title  independent with initial HEP    Status  New    Target Date  05/24/17      PT LONG TERM GOAL #2   Title  demonstrate improved postural awareness to decrease exacerbation of symptoms    Status  New    Target Date  05/24/17      PT LONG TERM GOAL #3   Title  improve cervical ROM by 5 degrees all motions for improved function and decreased pain    Status  New    Target Date  05/24/17      PT LONG TERM GOAL #4   Title  report pain < 3/10 in neck and back for improved function and mobility    Status  New    Target Date  05/24/17            Plan - 05/04/17 1520    Clinical Impression Statement  Pt reports improvement since last session and has reduced resistance at gym.  Pain at times is inconsistent and needs min to mod cues for exercises.  Pt seems to have "new" pains with each session making it  difficult to address and treat.    Rehab Potential  Good    PT Frequency  2x / week    PT Duration  6 weeks    PT Treatment/Interventions  ADLs/Self Care Home Management;Cryotherapy;Electrical Stimulation;Ultrasound;Traction;Moist Heat;Gait training;Functional mobility training;Therapeutic activities;Therapeutic exercise;Patient/family education;Manual techniques;Taping;Dry needling    PT Next Visit Plan  add/progress posture and core strengthening as able, manual/modalities/DN PRN for pain    Consulted and Agree with Plan of Care  Patient       Patient will benefit from skilled therapeutic intervention in order to improve the following deficits and impairments:  Pain, Postural dysfunction, Increased fascial restricitons, Increased muscle spasms, Decreased range of motion, Decreased strength, Impaired flexibility, Decreased activity tolerance, Decreased mobility  Visit Diagnosis: Cervicalgia  Acute bilateral low back pain without sciatica  Abnormal posture     Problem List Patient Active Problem List   Diagnosis Date Noted  . Rib pain on right side 02/03/2017  . Routine general medical examination at a health care facility 10/11/2016  . H/O right knee surgery 10/03/2012  . Elevated PSA 08/31/2012  . Hyperlipidemia 04/18/2007  . ESOPHAGITIS, REFLUX 04/18/2007  . BPH associated with nocturia 04/18/2007  . ACNE, ROSACEA 04/18/2007  . HEMATURIA UNSPECIFIED 03/26/2007      Matthew House, PT, DPT 05/04/17 3:23 PM    [facility] [address], [place], [place], [address] Phone: [phone]   Fax:  [phone]  Name: Matthew House MRN: [id number] Date of Birth: December 02, 1940

## 2017-05-08 ENCOUNTER — Encounter: Payer: Self-pay | Admitting: Physical Therapy

## 2017-05-08 ENCOUNTER — Telehealth: Payer: Self-pay | Admitting: Family Medicine

## 2017-05-08 ENCOUNTER — Ambulatory Visit: Payer: PPO | Admitting: Physical Therapy

## 2017-05-08 DIAGNOSIS — M545 Low back pain, unspecified: Secondary | ICD-10-CM

## 2017-05-08 DIAGNOSIS — M542 Cervicalgia: Secondary | ICD-10-CM

## 2017-05-08 DIAGNOSIS — R293 Abnormal posture: Secondary | ICD-10-CM

## 2017-05-08 NOTE — Telephone Encounter (Signed)
Patient states primary problem for Neurology appt is for numbness in his left arm, however he was also wanting to be seen for his pain from mid-back to tailbone and all of left hip including muscle spasms.  Patient's appt is on Friday, 05/12/17.

## 2017-05-08 NOTE — Therapy (Signed)
[facility] [address], [place], [place], [address] Phone: [phone]   Fax:  [phone]  Physical Therapy Treatment  Patient Details  Name: Matthew House MRN: [id number] Date of Birth: 02/03/1940 Referring Provider: [name], MD   Encounter Date: 05/08/2017  PT End of Session - 05/08/17 1447    Visit Number  8    Number of Visits  12    Date for PT Re-Evaluation  05/24/17    Authorization Type  [employer] $15 copay    PT Start Time  1446    PT Stop Time  1527    PT Time Calculation (min)  41 min    Activity Tolerance  Patient tolerated treatment well    Behavior During Therapy  [facility] for tasks assessed/performed       Past Medical History:  Diagnosis Date  . BPH (benign prostatic hyperplasia)   . Diverticulosis   . GERD (gastroesophageal reflux disease)   . Hyperlipidemia   . IBS (irritable bowel syndrome)   . Rosacea   . S/P tonsillectomy and adenoidectomy     Past Surgical History:  Procedure Laterality Date  . INCISE AND DRAIN ABCESS  2002   right 3rd finger  . KNEE CARTILAGE SURGERY Right   . TONSILLECTOMY AND ADENOIDECTOMY  1952  . VASECTOMY  1992    There were no vitals filed for this visit.  Subjective Assessment - 05/08/17 1450    Subjective  I had a lot of pain after doing laying on the foam roll. I have had more "cramping" and "spasming" in my buttocks and back.     Limitations  Standing;Walking;Sitting    Diagnostic tests  x rays negative of hips, lumbar spine    Patient Stated Goals  improve pain    Currently in Pain?  Yes    Pain Score  7  Lt buttocks, some low back     Pain Location  Buttocks 99% pain in buttocks    Pain Orientation  Left    Pain Descriptors / Indicators  Sharp    Aggravating Factors   Fairly constant as of late    Pain Relieving Factors  Stretching     Multiple Pain Sites  No                       [facility] Adult PT  Treatment/Exercise - 05/08/17 0001      Lumbar Exercises: Supine   Ab Set  -- 6x    Clam  20 reps    Bent Knee Raise  20 reps             PT Education - 05/08/17 1517    Education provided  Yes    Education Details  Discussed and educated pt on proper levels of exertion with his exercises that perhaps he was contracting muscles too hard.     Person(s) Educated  Patient    Methods  Explanation    Comprehension  Verbalized understanding          PT Long Term Goals - 04/12/17 1344      PT LONG TERM GOAL #1   Title  independent with initial HEP    Status  New    Target Date  05/24/17      PT LONG TERM GOAL #2   Title  demonstrate improved postural awareness to decrease exacerbation of symptoms    Status  New  Target Date  05/24/17      PT LONG TERM GOAL #3   Title  improve cervical ROM by 5 degrees all motions for improved function and decreased pain    Status  New    Target Date  05/24/17      PT LONG TERM GOAL #4   Title  report pain < 3/10 in neck and back for improved function and mobility    Status  New    Target Date  05/24/17            Plan - 05/08/17 1456    Clinical Impression Statement  Pt reports that since his last session he had increased back pain, butocks pain and spasms. He attributes this to either laying on the foam roll and/or moving his leg in standing. PTA assured him the intent was more than likely increasing movement where he is lacking and/or pain reduction. He reported rest and medication helped reduce the flare up. He wasn't sure exactly what he he wanted to accomplish today. He was encouraged to speak to his doctor reagrding his complaints especially  his LT low back and hip as he asks the same questions week after week regariding his symptoms.     Rehab Potential  Good    PT Frequency  2x / week    PT Duration  6 weeks    PT Treatment/Interventions  ADLs/Self Care Home Management;Cryotherapy;Electrical  Stimulation;Ultrasound;Traction;Moist Heat;Gait training;Functional mobility training;Therapeutic activities;Therapeutic exercise;Patient/family education;Manual techniques;Taping;Dry needling    PT Next Visit Plan   Consider DC or put pt on hold until after possible imaging.     Consulted and Agree with Plan of Care  Patient       Patient will benefit from skilled therapeutic intervention in order to improve the following deficits and impairments:  Pain, Postural dysfunction, Increased fascial restricitons, Increased muscle spasms, Decreased range of motion, Decreased strength, Impaired flexibility, Decreased activity tolerance, Decreased mobility  Visit Diagnosis: Cervicalgia  Acute bilateral low back pain without sciatica  Abnormal posture     Problem List Patient Active Problem List   Diagnosis Date Noted  . Rib pain on right side 02/03/2017  . Routine general medical examination at a health care facility 10/11/2016  . H/O right knee surgery 10/03/2012  . Elevated PSA 08/31/2012  . Hyperlipidemia 04/18/2007  . ESOPHAGITIS, REFLUX 04/18/2007  . BPH associated with nocturia 04/18/2007  . ACNE, ROSACEA 04/18/2007  . HEMATURIA UNSPECIFIED 03/26/2007    Burris Matherne, PTA 05/08/2017, 4:15 PM  [facility] [address] [place], [place], [address] Phone: [phone]   Fax:  [phone]  Name: Matthew House MRN: [id number] Date of Birth: 05-22-1940

## 2017-05-09 NOTE — Telephone Encounter (Signed)
Spoke with pt voiced understanding that he needs to notify the neurology office of any additional problems that he is having since he has already been scheduled for an appointment on 05/12/2017. Pt stated that he will discuss his problems and if further information is needed they will contact the office.

## 2017-05-09 NOTE — Telephone Encounter (Signed)
Called pt left a message for pt to call me regarding his request for additional information on his neurologist referral.

## 2017-05-11 ENCOUNTER — Ambulatory Visit: Payer: PPO | Admitting: Physical Therapy

## 2017-05-11 ENCOUNTER — Encounter: Payer: Self-pay | Admitting: Physical Therapy

## 2017-05-11 DIAGNOSIS — M542 Cervicalgia: Secondary | ICD-10-CM

## 2017-05-11 DIAGNOSIS — R293 Abnormal posture: Secondary | ICD-10-CM

## 2017-05-11 DIAGNOSIS — M545 Low back pain, unspecified: Secondary | ICD-10-CM

## 2017-05-11 NOTE — Therapy (Addendum)
[facility] [address] [place], [place], [address] Phone: [phone]   Fax:  [phone]  Physical Therapy Treatment/Discharge  Patient Details  Name: Matthew House MRN: [id number] Date of Birth: [birth year] Referring Provider: [name], MD   Encounter Date: 05/11/2017  PT End of Session - 05/11/17 1521    Visit Number  9    Number of Visits  12    Date for PT Re-Evaluation  05/24/17    Authorization Type  [employer] $15 copay    PT Start Time  7262    PT Stop Time  1532    PT Time Calculation (min)  47 min    Activity Tolerance  Patient tolerated treatment well    Behavior During Therapy  [facility] for tasks assessed/performed       Past Medical History:  Diagnosis Date  . BPH (benign prostatic hyperplasia)   . Diverticulosis   . GERD (gastroesophageal reflux disease)   . Hyperlipidemia   . IBS (irritable bowel syndrome)   . Rosacea   . S/P tonsillectomy and adenoidectomy     Past Surgical History:  Procedure Laterality Date  . INCISE AND DRAIN ABCESS  2002   right 3rd finger  . KNEE CARTILAGE SURGERY Right   . TONSILLECTOMY AND ADENOIDECTOMY  1952  . VASECTOMY  1992    There were no vitals filed for this visit.  Subjective Assessment - 05/11/17 1444    Subjective  wants to hold PT after today since he's seeing neurologist today.  c/o Lt thumb pain that started yesterday "like it got hit with a hammer."  pt also c/o Lt shoulder pain on posterior side and hip pain    Patient Stated Goals  improve pain    Currently in Pain?  Yes    Pain Score  6     Pain Location  Hip    Pain Orientation  Left    Pain Descriptors / Indicators  Sharp    Pain Type  Acute pain    Pain Onset  1 to 4 weeks ago    Pain Frequency  Constant    Aggravating Factors   sleeping on Lt side    Pain Relieving Factors  stretching         [facility] PT Assessment - 05/11/17 1457      AROM   Cervical - Right  Rotation  50    Cervical - Left Rotation  55                   [facility] Adult PT Treatment/Exercise - 05/11/17 1449      Self-Care   Other Self-Care Comments   pt requested that PT "write this down": c/o excessive itching of Lt knee which is random but he is unable to get it to stop.  recommended he discuss this with MD.      Lumbar Exercises: Aerobic   Recumbent Bike  L3 x 8 min      Lumbar Exercises: Prone   Straight Leg Raise  10 reps alternating      Lumbar Exercises: Quadruped   Madcat/Old Horse  10 reps    Single Arm Raise  Right;Left;10 reps    Straight Leg Raise  10 reps alternating    Other Quadruped Lumbar Exercises  attempted alternating UE support over red physioball: pt performed 2 reps bil without difficulty then when PT asked how he was doing pt c/o Rt sided rib  pain due to ball and unable to continue; able to continue with ball removed    Other Quadruped Lumbar Exercises  childs pose 2x15 sec      Modalities   Modalities  Moist Heat;Electrical Stimulation      Moist Heat Therapy   Number Minutes Moist Heat  15 Minutes    Moist Heat Location  Lumbar Spine;Shoulder      Electrical Stimulation   Electrical Stimulation Location  Lt hip and Lt lower thoracic spine    Electrical Stimulation Action  pre mod    Electrical Stimulation Parameters  to tolerance x 15 min    Electrical Stimulation Goals  Pain                  PT Long Term Goals - 05/11/17 1521      PT LONG TERM GOAL #1   Title  independent with initial HEP    Status  On-going      PT LONG TERM GOAL #2   Title  demonstrate improved postural awareness to decrease exacerbation of symptoms    Status  On-going      PT LONG TERM GOAL #3   Title  improve cervical ROM by 5 degrees all motions for improved function and decreased pain    Status  Achieved      PT LONG TERM GOAL #4   Title  report pain < 3/10 in neck and back for improved function and mobility    Status  On-going             Plan - 05/11/17 1522    Clinical Impression Statement  Pt has met 1 LTG and all other goals ongoing at this time.  Pt requesting hold from PT as he's scheduled to see neurologist tomorrow 05/12/17.  Pt with poor tolerance to exercises and reports some pain with all exercises, which may or may not affect the activity.  Continues to be difficult to address deficits due to continued complaints.    Rehab Potential  Good    PT Frequency  2x / week    PT Duration  6 weeks    PT Treatment/Interventions  ADLs/Self Care Home Management;Cryotherapy;Electrical Stimulation;Ultrasound;Traction;Moist Heat;Gait training;Functional mobility training;Therapeutic activities;Therapeutic exercise;Patient/family education;Manual techniques;Taping;Dry needling    PT Next Visit Plan  hold PT at this time    Consulted and Agree with Plan of Care  Patient       Patient will benefit from skilled therapeutic intervention in order to improve the following deficits and impairments:  Pain, Postural dysfunction, Increased fascial restricitons, Increased muscle spasms, Decreased range of motion, Decreased strength, Impaired flexibility, Decreased activity tolerance, Decreased mobility  Visit Diagnosis: Cervicalgia  Acute bilateral low back pain without sciatica  Abnormal posture     Problem List Patient Active Problem List   Diagnosis Date Noted  . Rib pain on right side 02/03/2017  . Routine general medical examination at a health care facility 10/11/2016  . H/O right knee surgery 10/03/2012  . Elevated PSA 08/31/2012  . Hyperlipidemia 04/18/2007  . ESOPHAGITIS, REFLUX 04/18/2007  . BPH associated with nocturia 04/18/2007  . ACNE, ROSACEA 04/18/2007  . HEMATURIA UNSPECIFIED 03/26/2007      [name], PT, DPT 05/11/17 3:26 PM     [facility] [address], [place], [place], [address] Phone: [phone]   Fax:   [phone]  Name: WOOD NOVACEK MRN: [id number] Date of Birth: [birth year]  PHYSICAL THERAPY DISCHARGE SUMMARY  Visits from Start of Care: 9  Current functional level related to goals / functional outcomes: See above    Remaining deficits: See above; pt came by office on 05/23/17 to give update on MD appts and requested d/c   Education / Equipment: HEP  Plan: Patient agrees to discharge.  Patient goals were partially met. Patient is being discharged due to the patient's request.  ?????     [name], PT, DPT 05/25/17 9:08 AM  [facility] [place], [place] [address]  [phone] (office) [phone] (fax)

## 2017-05-12 ENCOUNTER — Ambulatory Visit (INDEPENDENT_AMBULATORY_CARE_PROVIDER_SITE_OTHER): Payer: PPO | Admitting: Neurology

## 2017-05-12 ENCOUNTER — Encounter: Payer: Self-pay | Admitting: Neurology

## 2017-05-12 VITALS — BP 150/78 | HR 76 | Ht 71.0 in | Wt 237.5 lb

## 2017-05-12 DIAGNOSIS — R202 Paresthesia of skin: Secondary | ICD-10-CM | POA: Diagnosis not present

## 2017-05-12 DIAGNOSIS — M7918 Myalgia, other site: Secondary | ICD-10-CM

## 2017-05-12 DIAGNOSIS — G8929 Other chronic pain: Secondary | ICD-10-CM

## 2017-05-12 NOTE — Patient Instructions (Signed)
If you experience worsening numbness or tingling, please call my office and we can schedule nerve testing  Follow-up with Sports Medicine for your shoulder and hip pain
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
## 2017-05-15 ENCOUNTER — Encounter: Payer: PPO | Admitting: Physical Therapy

## 2017-05-15 ENCOUNTER — Encounter: Payer: Self-pay | Admitting: Sports Medicine

## 2017-05-15 ENCOUNTER — Ambulatory Visit (INDEPENDENT_AMBULATORY_CARE_PROVIDER_SITE_OTHER): Payer: PPO | Admitting: Sports Medicine

## 2017-05-15 VITALS — BP 142/76 | HR 84 | Ht 71.0 in | Wt 236.6 lb

## 2017-05-15 DIAGNOSIS — M9902 Segmental and somatic dysfunction of thoracic region: Secondary | ICD-10-CM

## 2017-05-15 DIAGNOSIS — M9908 Segmental and somatic dysfunction of rib cage: Secondary | ICD-10-CM | POA: Diagnosis not present

## 2017-05-15 DIAGNOSIS — M542 Cervicalgia: Secondary | ICD-10-CM

## 2017-05-15 DIAGNOSIS — M9901 Segmental and somatic dysfunction of cervical region: Secondary | ICD-10-CM
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
## 2017-05-15 NOTE — Patient Instructions (Signed)
Please perform the exercise program that we have prepared for you and gone over in detail on a daily basis.  In addition to the handout you were provided you can access your program through: [email or web address]   Your unique program code is: [email or web address]

## 2017-05-15 NOTE — Progress Notes (Signed)
PROCEDURE NOTE: THERAPEUTIC EXERCISES (97110) 15 minutes spent for Therapeutic exercises as below and as referenced in the AVS.  This included exercises focusing on stretching, strengthening, with significant focus on eccentric aspects.   Proper technique shown and discussed handout in great detail with ATC.  All questions were discussed and answered.   Long term goals include an improvement in range of motion, strength, endurance as well as avoiding reinjury. Frequency of visits is one time as determined during today's  office visit. Frequency of exercises to be performed is as per handout.  EXERCISES REVIEWED: Hip ABduction strengthening with focus on Glute Medius Recruitment towel stretching

## 2017-05-15 NOTE — Progress Notes (Signed)
PROCEDURE NOTE : OSTEOPATHIC MANIPULATION The decision today to treat with Osteopathic Manipulative Therapy (OMT) was based on physical exam findings. Verbal consent was obtained following a discussion with the patient regarding the of risks, benefits and potential side effects, including an acute pain flare,post manipulation soreness and need for repeat treatments.     NONE  Manipulation was performed as below: Regions treated: Cervical spine, Ribs and Thoracic spine OMT Techniques Used: HVLA, muscle energy and myofascial release  The patient tolerated the treatment well and reported Improved symptoms following treatment today. Patient was given medications, exercises, stretches and lifestyle modifications per AVS and verbally.   OSTEOPATHIC/STRUCTURAL EXAM:   OA - rotated right C2 FRS right (Flexed, Rotated & Sidebent) C6 ERS left (Extended, Rotated & Sidebent) T2 FRS right (Flexed, Rotated & Sidebent) T6 FRS left (Flexed, Rotated & Sidebent) Rib 8 Right  Posterior

## 2017-05-20 ENCOUNTER — Encounter: Payer: Self-pay | Admitting: Sports Medicine

## 2017-05-30 ENCOUNTER — Ambulatory Visit (INDEPENDENT_AMBULATORY_CARE_PROVIDER_SITE_OTHER): Payer: PPO | Admitting: Sports Medicine

## 2017-05-30 ENCOUNTER — Encounter: Payer: Self-pay | Admitting: Sports Medicine

## 2017-05-30 VITALS — BP 138/80 | HR 81 | Ht 71.5 in | Wt 238.0 lb

## 2017-05-30 DIAGNOSIS — M545 Low back pain, unspecified: Secondary | ICD-10-CM

## 2017-05-30 DIAGNOSIS — M9903 Segmental and somatic dysfunction of lumbar region: Secondary | ICD-10-CM | POA: Diagnosis not present

## 2017-05-30 DIAGNOSIS — M9905 Segmental and somatic dysfunction of pelvic region: Secondary | ICD-10-CM

## 2017-05-30 DIAGNOSIS — M9908 Segmental and somatic dysfunction of rib cage: Secondary | ICD-10-CM | POA: Diagnosis not present

## 2017-05-30 DIAGNOSIS — M9902 Segmental and somatic dysfunction of thoracic region: Secondary | ICD-10-CM

## 2017-05-30 DIAGNOSIS — M9901 Segmental and somatic dysfunction of cervical region: Secondary | ICD-10-CM

## 2017-05-30 DIAGNOSIS — M542 Cervicalgia: Secondary | ICD-10-CM | POA: Diagnosis not present

## 2017-05-30 NOTE — Progress Notes (Signed)
PROCEDURE NOTE : OSTEOPATHIC MANIPULATION The decision today to treat with Osteopathic Manipulative Therapy (OMT) was based on physical exam findings. Verbal consent was obtained following a discussion with the patient regarding the of risks, benefits and potential side effects, including an acute pain flare,post manipulation soreness and need for repeat treatments.     Contraindications to OMT reviewed and include: NONE  Manipulation was performed as below: Regions treated: Cervical spine, Ribs, Thoracic spine, Lumbar spine and Pelvis OMT Techniques Used: HVLA, muscle energy and myofascial release  The patient tolerated the treatment well and reported Improved symptoms following treatment today. Patient was given medications, exercises, stretches and lifestyle modifications per AVS and verbally.   OSTEOPATHIC/STRUCTURAL EXAM:   C2 through C4 FRS left C5 FRS right T2 extended side bent right T4 through T6 rotated left L3 FRS right Posterior rib 8 on the right Right anterior innominate
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
## 2017-05-31 DIAGNOSIS — H2512 Age-related nuclear cataract, left eye: Secondary | ICD-10-CM | POA: Diagnosis not present

## 2017-05-31 DIAGNOSIS — H2511 Age-related nuclear cataract, right eye: Secondary | ICD-10-CM | POA: Diagnosis not present

## 2017-06-05 ENCOUNTER — Encounter: Payer: Self-pay | Admitting: Sports Medicine

## 2017-06-08 ENCOUNTER — Encounter: Payer: Self-pay | Admitting: Sports Medicine

## 2017-06-08 ENCOUNTER — Ambulatory Visit (INDEPENDENT_AMBULATORY_CARE_PROVIDER_SITE_OTHER): Payer: PPO | Admitting: Sports Medicine

## 2017-06-08 VITALS — BP 138/72 | HR 77 | Ht 71.5 in | Wt 238.0 lb

## 2017-06-08 DIAGNOSIS — M940 Chondrocostal junction syndrome [Tietze]: Secondary | ICD-10-CM

## 2017-06-08 DIAGNOSIS — M9903 Segmental and somatic dysfunction of lumbar region: Secondary | ICD-10-CM | POA: Diagnosis not present

## 2017-06-08 DIAGNOSIS — M9908 Segmental and somatic dysfunction of rib cage: Secondary | ICD-10-CM

## 2017-06-08 DIAGNOSIS — R0789 Other chest pain: Secondary | ICD-10-CM | POA: Diagnosis not present

## 2017-06-08 DIAGNOSIS — R0781 Pleurodynia: Secondary | ICD-10-CM

## 2017-06-08 DIAGNOSIS — M9901 Segmental and somatic dysfunction of cervical region: Secondary | ICD-10-CM | POA: Diagnosis not present

## 2017-06-08 DIAGNOSIS — M9902 Segmental and somatic dysfunction of thoracic region: Secondary | ICD-10-CM

## 2017-06-08 DIAGNOSIS — M9905 Segmental and somatic dysfunction of pelvic region: Secondary | ICD-10-CM

## 2017-06-08 NOTE — Patient Instructions (Addendum)
  Please perform the exercise program that we have prepared for you and gone over in detail on a daily basis.  In addition to the handout you were provided you can access your program through: [email or web address]   Your unique program code is: [email or web address]
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
## 2017-06-13 ENCOUNTER — Ambulatory Visit (INDEPENDENT_AMBULATORY_CARE_PROVIDER_SITE_OTHER): Payer: PPO | Admitting: Sports Medicine

## 2017-06-13 ENCOUNTER — Ambulatory Visit: Payer: PPO | Admitting: Sports Medicine

## 2017-06-13 ENCOUNTER — Encounter: Payer: Self-pay | Admitting: Sports Medicine

## 2017-06-13 VITALS — BP 130/72 | HR 73 | Ht 71.5 in

## 2017-06-13 DIAGNOSIS — M542 Cervicalgia: Secondary | ICD-10-CM

## 2017-06-13 DIAGNOSIS — M545 Low back pain, unspecified: Secondary | ICD-10-CM

## 2017-06-13 DIAGNOSIS — M9901 Segmental and somatic dysfunction of cervical region: Secondary | ICD-10-CM | POA: Diagnosis not present

## 2017-06-13 DIAGNOSIS — M9908 Segmental and somatic dysfunction of rib cage: Secondary | ICD-10-CM

## 2017-06-13 DIAGNOSIS — M9902 Segmental and somatic dysfunction of thoracic region: Secondary | ICD-10-CM | POA: Diagnosis not present

## 2017-06-13 NOTE — Progress Notes (Signed)
PROCEDURE NOTE : OSTEOPATHIC MANIPULATION The decision today to treat with Osteopathic Manipulative Therapy (OMT) was based on physical exam findings. Verbal consent was obtained following a discussion with the patient regarding the of risks, benefits and potential side effects, including an acute pain flare,post manipulation soreness and need for repeat treatments.    Contraindications to OMT reviewed and include: NONE  Manipulation was performed as below: Regions treated: Cervical spine, Ribs and Thoracic spine OMT Techniques Used: HVLA, muscle energy and myofascial release  The patient tolerated the treatment well and reported Improved symptoms following treatment today. Patient was given medications, exercises, stretches and lifestyle modifications per AVS and verbally.   OSTEOPATHIC/STRUCTURAL EXAM:   C2 through C4 FRS left C6 FRS right T2 - T5 neutral side bent right, rotated left Ribs 6 posterior right
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
## 2017-06-19 ENCOUNTER — Encounter: Payer: Self-pay | Admitting: Family Medicine

## 2017-06-19 ENCOUNTER — Ambulatory Visit (INDEPENDENT_AMBULATORY_CARE_PROVIDER_SITE_OTHER): Payer: PPO | Admitting: Family Medicine

## 2017-06-19 VITALS — BP 138/64 | HR 67 | Temp 97.9°F | Ht 71.5 in | Wt 236.6 lb

## 2017-06-19 DIAGNOSIS — K120 Recurrent oral aphthae: Secondary | ICD-10-CM

## 2017-06-19 NOTE — Progress Notes (Signed)
   Subjective:    Patient ID: Matthew House, male    DOB: 1940/01/15, 77 y.o.   MRN: [id number]  HPI Here for 2 days of soreness at the back of the tongue on the left side. He felt some small bumps in this area with his finger. He does not feel ill otherwise.   Review of Systems  Constitutional: Negative.   HENT: Positive for mouth sores. Negative for postnasal drip and sore throat.   Eyes: Negative.   Respiratory: Negative.        Objective:   Physical Exam  Constitutional: He appears well-developed and well-nourished.  HENT:  Right Ear: External ear normal.  Left Ear: External ear normal.  Nose: Nose normal.  There is a small area of tenderness at the back of the tongue on the left side, near the junction with the pharynx. No visible lesions.  Eyes: Conjunctivae are normal.  Neck: No thyromegaly present.  Pulmonary/Chest: Effort normal and breath sounds normal.  Lymphadenopathy:    He has no cervical adenopathy.          Assessment & Plan:  I suspect this is the beginning of an aphthous ulcer, which he has had before. He will use warm saltwater rinses. Recheck prn. Alysia Penna, MD

## 2017-06-23 ENCOUNTER — Ambulatory Visit (INDEPENDENT_AMBULATORY_CARE_PROVIDER_SITE_OTHER): Payer: PPO | Admitting: Sports Medicine

## 2017-06-23 ENCOUNTER — Encounter: Payer: Self-pay | Admitting: Sports Medicine

## 2017-06-23 VITALS — BP 140/82 | HR 80 | Ht 71.5 in | Wt 237.4 lb

## 2017-06-23 DIAGNOSIS — M9908 Segmental and somatic dysfunction of rib cage: Secondary | ICD-10-CM | POA: Diagnosis not present

## 2017-06-23 DIAGNOSIS — M9902 Segmental and somatic dysfunction of thoracic region: Secondary | ICD-10-CM | POA: Diagnosis not present

## 2017-06-23 DIAGNOSIS — M9901 Segmental and somatic dysfunction of cervical region: Secondary | ICD-10-CM | POA: Diagnosis not present

## 2017-06-23 DIAGNOSIS — M940 Chondrocostal junction syndrome [Tietze]: Secondary | ICD-10-CM
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
## 2017-06-27 ENCOUNTER — Ambulatory Visit: Payer: PPO | Admitting: Sports Medicine

## 2017-06-27 ENCOUNTER — Encounter: Payer: Self-pay | Admitting: Sports Medicine

## 2017-07-02 ENCOUNTER — Encounter: Payer: Self-pay | Admitting: Sports Medicine

## 2017-07-04 ENCOUNTER — Encounter: Payer: Self-pay | Admitting: Sports Medicine

## 2017-07-04 ENCOUNTER — Ambulatory Visit (INDEPENDENT_AMBULATORY_CARE_PROVIDER_SITE_OTHER): Payer: PPO | Admitting: Sports Medicine

## 2017-07-04 VITALS — BP 130/82 | HR 66 | Ht 71.5 in | Wt 237.2 lb

## 2017-07-04 DIAGNOSIS — M9901 Segmental and somatic dysfunction of cervical region: Secondary | ICD-10-CM

## 2017-07-04 DIAGNOSIS — M940 Chondrocostal junction syndrome [Tietze]: Secondary | ICD-10-CM

## 2017-07-04 DIAGNOSIS — M9908 Segmental and somatic dysfunction of rib cage: Secondary | ICD-10-CM | POA: Diagnosis not present

## 2017-07-04 DIAGNOSIS — M545 Low back pain, unspecified: Secondary | ICD-10-CM

## 2017-07-04 DIAGNOSIS — M9902 Segmental and somatic dysfunction of thoracic region: Secondary | ICD-10-CM | POA: Diagnosis not present

## 2017-07-04 DIAGNOSIS — K2100 Gastro-esophageal reflux disease with esophagitis, without bleeding: Secondary | ICD-10-CM

## 2017-07-04 MED ORDER — PANTOPRAZOLE SODIUM 40 MG PO TBEC: 40.0000 mg | DELAYED_RELEASE_TABLET | Freq: Every day | ORAL | 1 refills | Status: DC
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
## 2017-07-09 ENCOUNTER — Encounter: Payer: Self-pay | Admitting: Sports Medicine

## 2017-07-18 ENCOUNTER — Encounter: Payer: Self-pay | Admitting: Sports Medicine

## 2017-07-18 ENCOUNTER — Ambulatory Visit (INDEPENDENT_AMBULATORY_CARE_PROVIDER_SITE_OTHER): Payer: PPO | Admitting: Sports Medicine

## 2017-07-18 VITALS — BP 148/70 | HR 72 | Ht 71.5 in | Wt 239.0 lb

## 2017-07-18 DIAGNOSIS — M9901 Segmental and somatic dysfunction of cervical region: Secondary | ICD-10-CM

## 2017-07-18 DIAGNOSIS — M940 Chondrocostal junction syndrome [Tietze]: Secondary | ICD-10-CM | POA: Diagnosis not present

## 2017-07-18 DIAGNOSIS — M545 Low back pain, unspecified: Secondary | ICD-10-CM

## 2017-07-18 DIAGNOSIS — M9902 Segmental and somatic dysfunction of thoracic region: Secondary | ICD-10-CM

## 2017-07-18 DIAGNOSIS — M9908 Segmental and somatic dysfunction of rib cage: Secondary | ICD-10-CM | POA: Diagnosis not present
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
## 2017-07-18 NOTE — Progress Notes (Signed)
PROCEDURE NOTE : OSTEOPATHIC MANIPULATION The decision today to treat with Osteopathic Manipulative Therapy (OMT) was based on physical exam findings. Verbal consent was obtained following a discussion with the patient regarding the of risks, benefits and potential side effects, including an acute pain flare,post manipulation soreness and need for repeat treatments.     Contraindications to OMT reviewed and include: NONE  Manipulation was performed as below: Regions treated: Cervical spine, Ribs and Thoracic spine OMT Techniques Used: HVLA, muscle energy, myofascial release, soft tissue and counterstrain  The patient tolerated the treatment well and reported Improved symptoms following treatment today. Patient was given medications, exercises, stretches and lifestyle modifications per AVS and verbally.   OSTEOPATHIC/STRUCTURAL EXAM:   C2 rotated right C4-C6 rotated left, side bent right Posterior rib 8 on the right T2-T6 neutral rotated left, side bent right

## 2017-07-19 ENCOUNTER — Encounter: Payer: Self-pay | Admitting: Sports Medicine

## 2017-07-28 ENCOUNTER — Encounter: Payer: Self-pay | Admitting: Internal Medicine

## 2017-07-28 ENCOUNTER — Ambulatory Visit (INDEPENDENT_AMBULATORY_CARE_PROVIDER_SITE_OTHER): Payer: PPO | Admitting: Internal Medicine

## 2017-07-28 VITALS — BP 132/70 | HR 75 | Temp 97.6°F | Wt 238.0 lb

## 2017-07-28 DIAGNOSIS — L719 Rosacea, unspecified: Secondary | ICD-10-CM | POA: Diagnosis not present

## 2017-07-28 DIAGNOSIS — Z85828 Personal history of other malignant neoplasm of skin: Secondary | ICD-10-CM | POA: Diagnosis not present

## 2017-07-28 DIAGNOSIS — Z9889 Other specified postprocedural states: Secondary | ICD-10-CM | POA: Diagnosis not present

## 2017-07-28 DIAGNOSIS — L089 Local infection of the skin and subcutaneous tissue, unspecified: Secondary | ICD-10-CM | POA: Diagnosis not present

## 2017-07-28 NOTE — Progress Notes (Signed)
Chief Complaint  Patient presents with  . Nose Problem    Left sided nostril swelling/growth. Previous Mohs Surgery and pt was advised be seen if any abnormal bumps/lumps in the future. Some pain/tenderness. Pt has tried hot compress to reduce swelling. Spot is hard.    HPI: Matthew House 77 y.o.    SDA  PCP NA  Remote hx of  Cancer left nose and  Noted  About a week ago inc nodule pi,ple like area on left nostril  Not really tender but told to chek if any changes  cuase of prev dx.  Walked in to derm and they couldn't see him until sat august 10 . No pain fever  Takes doxy  100 per day for years for rosacea .  Per dr  [name].  No se .  ROS: See pertinent positives and negatives per HPI. wifre recnetly dx with baldder tumor under eval by urology has some /s Past Medical History:  Diagnosis Date  . BPH (benign prostatic hyperplasia)   . Diverticulosis   . GERD (gastroesophageal reflux disease)   . Hyperlipidemia   . IBS (irritable bowel syndrome)   . Rosacea   . S/P tonsillectomy and adenoidectomy     No family history on file.  Social History   Socioeconomic History  . Marital status: Married    Spouse name: Not on file  . Number of children: 2  . Years of education: 81  . Highest education level: Not on file  Occupational History  . Occupation: retired Engineer, structural  Social Needs  . Financial resource strain: Not on file  . Food insecurity:    Worry: Not on file    Inability: Not on file  . Transportation needs:    Medical: Not on file    Non-medical: Not on file  Tobacco Use  . Smoking status: Former Smoker    Last attempt to quit: 01/03/1978    Years since quitting: 39.5  . Smokeless tobacco: Never Used  Substance and Sexual Activity  . Alcohol use: No    Alcohol/week: 0.0 oz  . Drug use: No  . Sexual activity: Not on file  Lifestyle  . Physical activity:    Days per week: Not on file    Minutes per session: Not on file  . Stress: Not on file    Relationships  . Social connections:    Talks on phone: Not on file    Gets together: Not on file    Attends religious service: Not on file    Active member of club or organization: Not on file    Attends meetings of clubs or organizations: Not on file    Relationship status: Not on file  Other Topics Concern  . Not on file  Social History Narrative   Lives with wife in a 2 story home.  Has 2 children.  Retired from Event organiser.  Education: some college.     Outpatient Medications Prior to Visit  Medication Sig Dispense Refill  . aspirin 81 MG tablet Take 81 mg by mouth every evening.     Marland Kitchen doxycycline (VIBRAMYCIN) 100 MG capsule Take 1 capsule (100 mg total) by mouth daily. 200 capsule 3  . oxybutynin (DITROPAN) 5 MG tablet Take 1 tablet (5 mg total) by mouth every morning. 90 tablet 3  . pantoprazole (PROTONIX) 40 MG tablet Take 1 tablet (40 mg total) by mouth daily. 30 tablet 1  . simvastatin (ZOCOR) 40 MG tablet TAKE  ONE TABLET AT BEDTIME. 90 tablet 4  . Saw Palmetto, Serenoa repens, 1000 MG CAPS Take 1 capsule by mouth 2 (two) times daily.     Marland Kitchen triamcinolone cream (KENALOG) 0.1 % Apply 1 application topically 2 (two) times daily. (Patient not taking: Reported on 07/28/2017) 45 g 2   No facility-administered medications prior to visit.      EXAM:  BP 132/70 (BP Location: Right Arm, Patient Position: Sitting, Cuff Size: Normal)   Pulse 75   Temp 97.6 F (36.4 C) (Oral)   Wt 238 lb (108 kg)   BMI 32.73 kg/m   Body mass index is 32.73 kg/m.  GENERAL: vitals reviewed and listed above, alert, oriented, appears well hydrated and in no acute distress HEENT: atraumatic, conjunctiva  clear, no obvious abnormalities on inspection of external nose and ears  Left nasal area post surgical and rosacea like changes noted  And  At tip of left alar areas is 3 mm pustular    Lesion   Min to no tenderns and has center?  Increase redness   NECK: no obvious masses on inspection  palpation  PSYCH: pleasant and cooperative, no obvious depression or anxiety  ASSESSMENT AND PLAN:  Discussed the following assessment and plan:  Pustule of nostril  ACNE, ROSACEA  History of Mohs micrographic surgery for skin cancer Lesion left nostril seems like  A pustule  Either rosaceal or infection  But in the area of cancer  And  Not that tender    Inc doxy to bid until sees derm    contact team  If other warning sx ( his reg fu visit was for October 2019. Disc  About  wifes  New medical condition.  -Patient advised to return or notify health care team  if symptoms worsen ,persist or new concerns arise.  Patient Instructions  This acts like a secondary  .Marland Kitchen    Infection   Continue   Local compresses and increaese the doxy to twice  A day for 10- 14 days .   Keep  Appt.     Standley Brooking. Lysette Lindenbaum M.D.

## 2017-07-28 NOTE — Patient Instructions (Signed)
This acts like a secondary  .Marland Kitchen    Infection   Continue   Local compresses and increaese the doxy to twice  A day for 10- 14 days .   Keep  Appt.

## 2017-08-01 ENCOUNTER — Ambulatory Visit (INDEPENDENT_AMBULATORY_CARE_PROVIDER_SITE_OTHER): Payer: PPO | Admitting: Sports Medicine

## 2017-08-01 ENCOUNTER — Encounter: Payer: Self-pay | Admitting: Sports Medicine

## 2017-08-01 VITALS — BP 150/80 | HR 93 | Ht 71.5 in

## 2017-08-01 DIAGNOSIS — M546 Pain in thoracic spine: Secondary | ICD-10-CM | POA: Diagnosis not present

## 2017-08-01 DIAGNOSIS — R0781 Pleurodynia: Secondary | ICD-10-CM

## 2017-08-01 DIAGNOSIS — M9901 Segmental and somatic dysfunction of cervical region: Secondary | ICD-10-CM

## 2017-08-01 DIAGNOSIS — G8929 Other chronic pain: Secondary | ICD-10-CM

## 2017-08-01 DIAGNOSIS — M9902 Segmental and somatic dysfunction of thoracic region: Secondary | ICD-10-CM | POA: Diagnosis not present

## 2017-08-01 DIAGNOSIS — M9908 Segmental and somatic dysfunction of rib cage: Secondary | ICD-10-CM

## 2017-08-01 DIAGNOSIS — M9903 Segmental and somatic dysfunction of lumbar region: Secondary | ICD-10-CM | POA: Diagnosis not present
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
## 2017-08-01 NOTE — Progress Notes (Signed)
PROCEDURE NOTE : OSTEOPATHIC MANIPULATION The decision today to treat with Osteopathic Manipulative Therapy (OMT) was based on physical exam findings. Verbal consent was obtained following a discussion with the patient regarding the of risks, benefits and potential side effects, including an acute pain flare,post manipulation soreness and need for repeat treatments.     Contraindications to OMT: NONE  Manipulation was performed as below: Regions treated: Cervical spine, Ribs, Thoracic spine and Lumbar spine OMT Techniques Used: HVLA, muscle energy and myofascial release  The patient tolerated the treatment well and reported Improved symptoms following treatment today. Patient was given medications, exercises, stretches and lifestyle modifications per AVS and verbally.   OSTEOPATHIC/STRUCTURAL EXAM:   OA - rotated right C5 Flexed, rotated left T2 -8 Neutral, Rotated LEFT, Sidebent RIGHT Rib 5 Right  Posterior L3 FRS right (Flexed, Rotated & Sidebent)

## 2017-08-08 IMAGING — DX DG CHEST 2V
2 series · 2 of 2 positions shown · non-contrast
Comparison: Chest x-ray of March 15, 2013

CLINICAL DATA: Right anterior rib pain for the past week

EXAM:
CHEST  2 VIEW

[chest pa]
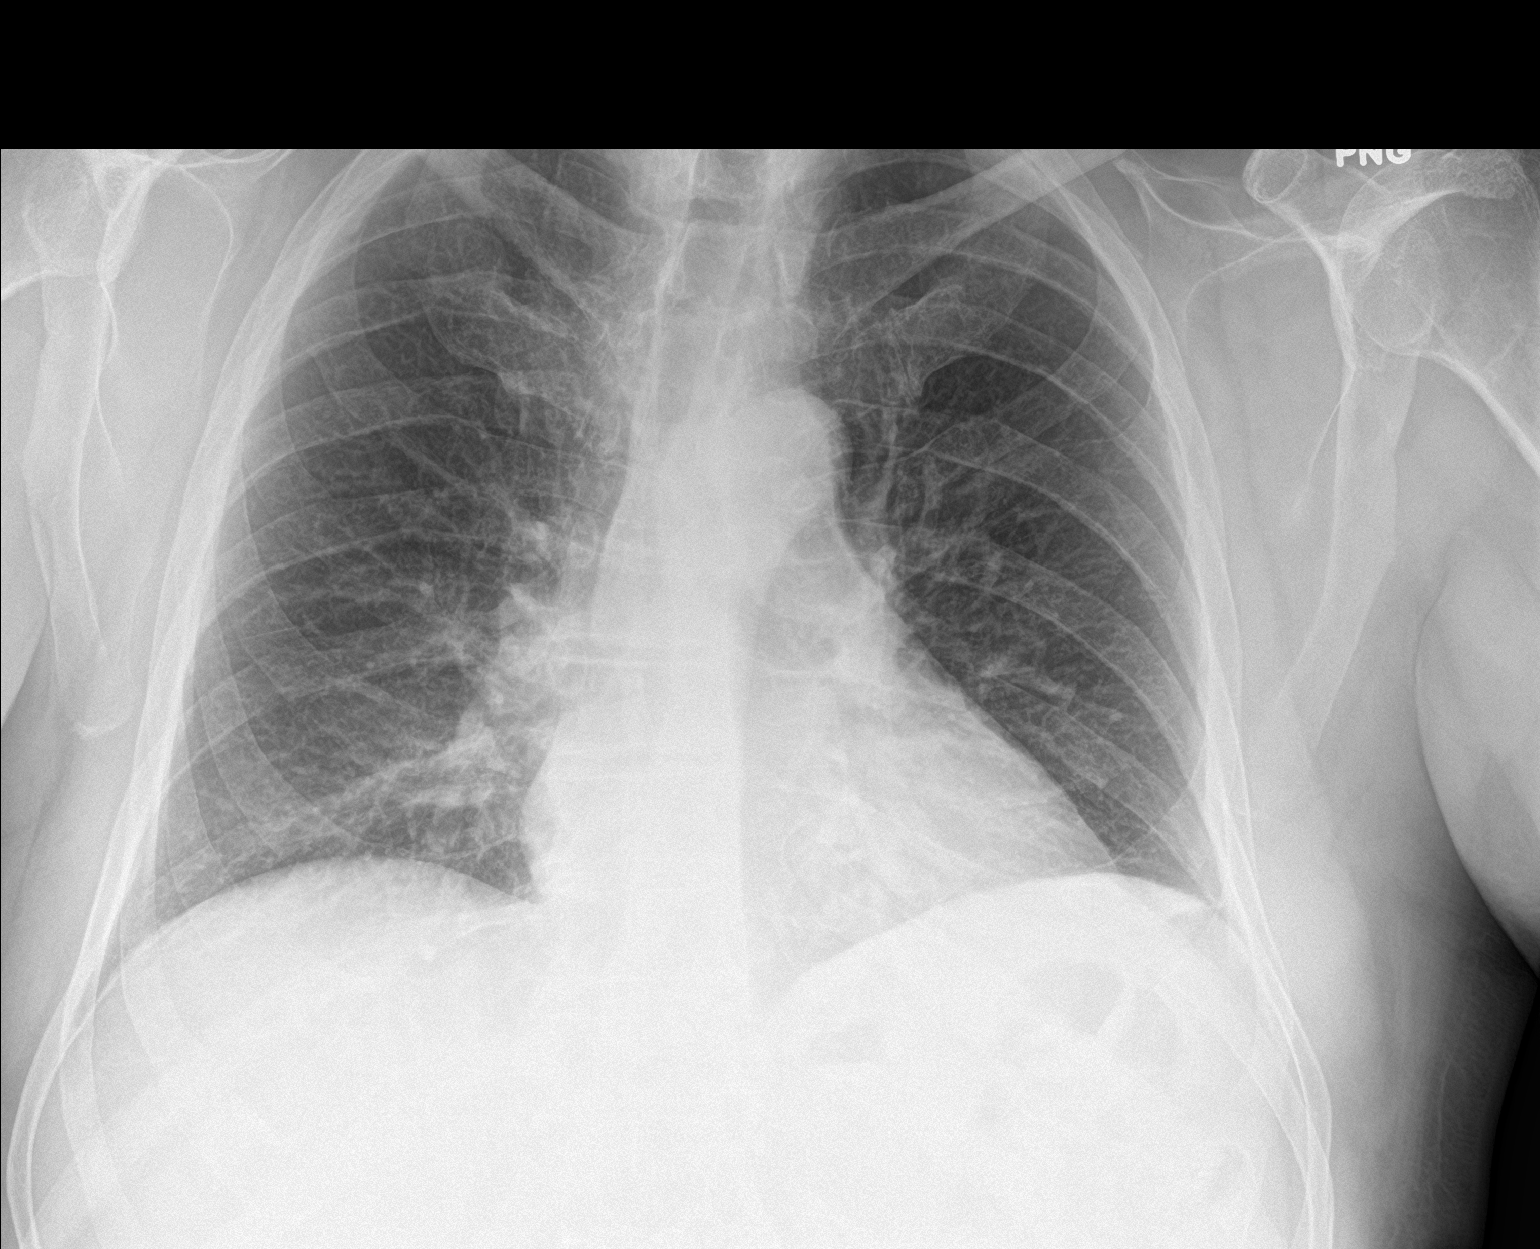

[chest lat]
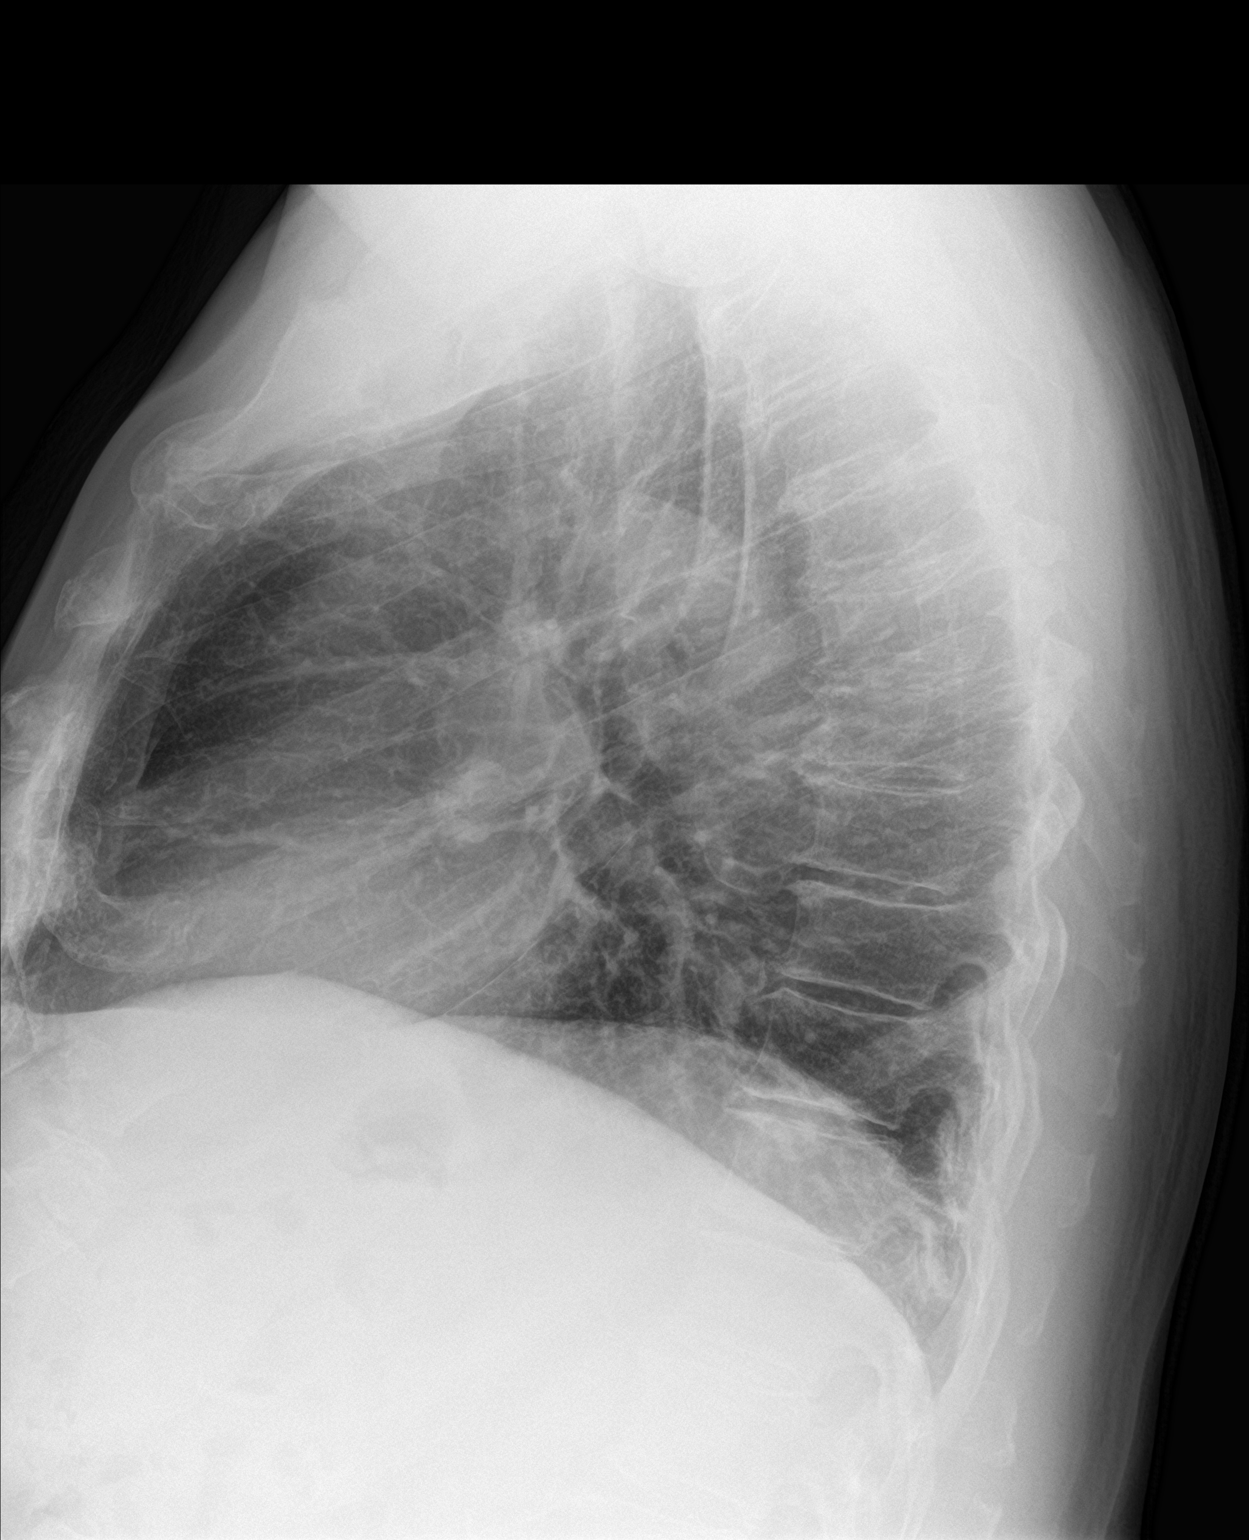

[2 of 2 positions shown; findings below may reference images not displayed]

FINDINGS: The lungs are adequately inflated. The lung markings are coarse at
the right base. There is no pleural effusion. The heart is
top-normal in size. The pulmonary vascularity is normal. There is
calcification in the wall of the aortic arch. There is mild
dextrocurvature centered in the mid to lower thoracic spine. The
bony thorax exhibits no acute abnormality. The visualized portions
of the right ribs exhibit no acute abnormalities.
IMPRESSION: Coarse right infrahilar lung markings suggests subsegmental
atelectasis. There is no alveolar pneumonia. The observed portions
of the right ribs exhibit no acute abnormalities.

## 2017-08-12 DIAGNOSIS — L309 Dermatitis, unspecified: Secondary | ICD-10-CM | POA: Diagnosis not present

## 2017-08-12 DIAGNOSIS — D225 Melanocytic nevi of trunk: Secondary | ICD-10-CM | POA: Diagnosis not present

## 2017-08-12 DIAGNOSIS — Z85828 Personal history of other malignant neoplasm of skin: Secondary | ICD-10-CM | POA: Diagnosis not present

## 2017-08-12 DIAGNOSIS — L718 Other rosacea: Secondary | ICD-10-CM | POA: Diagnosis not present

## 2017-08-12 DIAGNOSIS — L821 Other seborrheic keratosis: Secondary | ICD-10-CM | POA: Diagnosis not present

## 2017-08-12 DIAGNOSIS — L814 Other melanin hyperpigmentation: Secondary | ICD-10-CM | POA: Diagnosis not present

## 2017-08-12 DIAGNOSIS — D1801 Hemangioma of skin and subcutaneous tissue: Secondary | ICD-10-CM | POA: Diagnosis not present

## 2017-08-14 ENCOUNTER — Encounter: Payer: Self-pay | Admitting: Family Medicine

## 2017-08-14 ENCOUNTER — Other Ambulatory Visit: Payer: Self-pay | Admitting: Sports Medicine

## 2017-08-14 ENCOUNTER — Ambulatory Visit (INDEPENDENT_AMBULATORY_CARE_PROVIDER_SITE_OTHER): Payer: PPO | Admitting: Family Medicine

## 2017-08-14 ENCOUNTER — Telehealth: Payer: Self-pay | Admitting: Family Medicine

## 2017-08-14 VITALS — BP 148/84 | HR 75 | Temp 97.9°F | Wt 240.0 lb

## 2017-08-14 DIAGNOSIS — K2100 Gastro-esophageal reflux disease with esophagitis, without bleeding: Secondary | ICD-10-CM

## 2017-08-14 DIAGNOSIS — R1084 Generalized abdominal pain: Secondary | ICD-10-CM

## 2017-08-14 MED ORDER — RANITIDINE HCL 300 MG PO TABS: 300.0000 mg | ORAL_TABLET | Freq: Two times a day (BID) | ORAL | 0 refills | Status: DC
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
## 2017-08-14 NOTE — Progress Notes (Signed)
   Subjective:    Patient ID: Matthew House, male    DOB: 25-Nov-1940, 77 y.o.   MRN: [id number]  HPI Here for several weeks of symptoms including mild diffuse abdominal cramps, nausea without vomiting, sweats, headaches, and diarrhea which has a foul odor. He notes that he took Zantac 150 mg bid for years to control GERD, but that this had been worse over the past few months. He thinks stress over his wife's medical issues has contributed to this. He mentioned this to Dr. Paulla Fore and about 4 weeks ago he switched him to Protonix 20 mg daily. [facility] thinks this is what is causing him to feel bad.    Review of Systems  Constitutional: Positive for diaphoresis. Negative for chills and fever.  Respiratory: Negative.   Cardiovascular: Negative.   Gastrointestinal: Positive for abdominal pain, diarrhea and nausea. Negative for abdominal distention, anal bleeding, blood in stool, constipation, rectal pain and vomiting.  Genitourinary: Negative.        Objective:   Physical Exam  Constitutional: He appears well-developed and well-nourished. No distress.  Cardiovascular: Normal rate, regular rhythm, normal heart sounds and intact distal pulses.  Pulmonary/Chest: Effort normal and breath sounds normal.  Abdominal: Soft. Bowel sounds are normal. He exhibits no distension and no mass. There is no rebound and no guarding. No hernia.  Mild diffuse tenderness           Assessment & Plan:  I agree that the Protonix seems to be causing a lot of side effects for him. He will stop this and get back on Zantac, but we will increase the dose of this to 300 mg bid for a few weeks. At that point I anticipate he can go back to the 150 mg dose again.  Alysia Penna, MD

## 2017-08-22 ENCOUNTER — Telehealth: Payer: Self-pay

## 2017-08-22 NOTE — Telephone Encounter (Signed)
Copied from [place] [phone]. Topic: Quick Communication - See Telephone Encounter >> Aug 22, 2017 10:27 AM Hewitt Shorts wrote: [facility] is calling to confirm how the patient his doxycycline to be able to use the blister pack for this medication as well  Best number [phone]

## 2017-08-22 NOTE — Telephone Encounter (Signed)
Spoke with pt pharmacy gave clarification on how pt is taking his Rx for Doxycycline. Pharmacy voiced understanding.

## 2017-08-29 ENCOUNTER — Ambulatory Visit (INDEPENDENT_AMBULATORY_CARE_PROVIDER_SITE_OTHER): Payer: PPO | Admitting: Family Medicine

## 2017-08-29 ENCOUNTER — Encounter: Payer: Self-pay | Admitting: Family Medicine

## 2017-08-29 VITALS — BP 136/78 | HR 88 | Temp 97.7°F | Ht 71.5 in | Wt 240.6 lb

## 2017-08-29 DIAGNOSIS — H6123 Impacted cerumen, bilateral: Secondary | ICD-10-CM

## 2017-08-29 DIAGNOSIS — K2100 Gastro-esophageal reflux disease with esophagitis, without bleeding: Secondary | ICD-10-CM

## 2017-08-29 NOTE — Progress Notes (Signed)
   Subjective:    Patient ID: Matthew House, male    DOB: Apr 20, 1940, 77 y.o.   MRN: [id number]  HPI Here complaining of trouble hearing and asking to have his eras cleaned out. He say his hearing aid dealer yesterday and he told him he had wax in the ears. Also he is happy with the Zantac he is taking. All the abdominal pain and diarrhea and nausea that he had at his last visit here have resolved.    Review of Systems  Constitutional: Negative.   HENT: Positive for hearing loss.   Respiratory: Negative.   Cardiovascular: Negative.   Gastrointestinal: Negative.        Objective:   Physical Exam  Constitutional: He appears well-developed and well-nourished.  HENT:  Nose: Nose normal.  Mouth/Throat: Oropharynx is clear and moist.  Both ear canals have some cerumen present   Eyes: Conjunctivae are normal.  Neck: No thyromegaly present.  Abdominal: Soft. Bowel sounds are normal. He exhibits no mass. There is no rebound and no guarding. No hernia.  Lymphadenopathy:    He has no cervical adenopathy.          Assessment & Plan:  Cerumen impactions, both ear canals were irrigated clear with water. His abdominal symptoms have resolved and he plans to stay on Zantac.  Alysia Penna, MD

## 2017-09-11 ENCOUNTER — Other Ambulatory Visit: Payer: Self-pay | Admitting: Family Medicine

## 2017-09-15 ENCOUNTER — Other Ambulatory Visit: Payer: Self-pay | Admitting: Family Medicine

## 2017-09-15 DIAGNOSIS — M1711 Unilateral primary osteoarthritis, right knee: Secondary | ICD-10-CM | POA: Diagnosis not present

## 2017-09-15 DIAGNOSIS — E7800 Pure hypercholesterolemia, unspecified: Secondary | ICD-10-CM

## 2017-09-15 DIAGNOSIS — G8929 Other chronic pain: Secondary | ICD-10-CM | POA: Diagnosis not present

## 2017-09-16 ENCOUNTER — Encounter: Payer: Self-pay | Admitting: Sports Medicine

## 2017-09-16 NOTE — Progress Notes (Signed)
PROCEDURE NOTE : OSTEOPATHIC MANIPULATION The decision today to treat with Osteopathic Manipulative Therapy (OMT) was based on physical exam findings. Verbal consent was obtained following a discussion with the patient regarding the of risks, benefits and potential side effects, including an acute pain flare,post manipulation soreness and need for repeat treatments.     Contraindications to OMT: NONE  Manipulation was performed as below: Regions Treated OMT Techniques Used  Cervical spine Thoracic spine Ribs HVLA, muscle energy and myofascial release   The patient tolerated the treatment well and reported Improved symptoms following treatment today. Patient was given medications, exercises, stretches and lifestyle modifications per AVS and verbally.   OSTEOPATHIC/STRUCTURAL EXAM:   OA - rotated right C3 - 4 rotated right, side bent left and extended C7 ERS left (Extended, Rotated & Sidebent) T2 -6 Neutral, Rotated LEFT, Sidebent RIGHT T8 -10 Neutral, Rotated RIGHT, Sidebent LEFT Inhalation dysfunction of rib 10 Rib 7 Right  Posterior

## 2017-09-17 ENCOUNTER — Encounter: Payer: Self-pay | Admitting: Sports Medicine

## 2017-09-17 NOTE — Progress Notes (Signed)
PROCEDURE NOTE : OSTEOPATHIC MANIPULATION The decision today to treat with Osteopathic Manipulative Therapy (OMT) was based on physical exam findings. Verbal consent was obtained following a discussion with the patient regarding the of risks, benefits and potential side effects, including an acute pain flare,post manipulation soreness and need for repeat treatments.     Contraindications to OMT: NONE  Manipulation was performed as below: Regions Treated OMT Techniques Used  Cervical spine Thoracic spine Ribs HVLA muscle energy myofascial release   The patient tolerated the treatment well and reported Improved symptoms following treatment today. Patient was given medications, exercises, stretches and lifestyle modifications per AVS and verbally.   OSTEOPATHIC/STRUCTURAL EXAM:   OA - rotated right C3 - 4 rotated right, side bent left and extended C7 ERS left (Extended, Rotated & Sidebent) T2 -6 Neutral, Rotated LEFT, Sidebent RIGHT T8 -10 Neutral, Rotated RIGHT, Sidebent LEFT Inhalation dysfunction of rib 10 Rib 7 Right  Posterior

## 2017-09-17 NOTE — Assessment & Plan Note (Signed)
He is reporting consistent coughing and epigastric discomfort that is nonexertional in nature.  He has had a long-standing issue with acid reflux.  Will trial PPI instead of H2 blocker to see if this will add some additional benefit and possibly reduce diaphragmatic irritation.

## 2017-09-18 ENCOUNTER — Encounter: Payer: Self-pay | Admitting: Sports Medicine

## 2017-10-04 ENCOUNTER — Telehealth: Payer: Self-pay | Admitting: Family Medicine

## 2017-10-04 NOTE — Telephone Encounter (Signed)
Spoke to [place] and after reviewing notes, informed him that he contacted the wrong office for this matter. Pt verbalized understanding and stated the thought he called [employer]. I informed pt that I will forward the message to [facility] but pt declined. Pt stated that he will go by the office tomorrow to f/u.
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
## 2017-10-05 ENCOUNTER — Telehealth: Payer: Self-pay | Admitting: Sports Medicine

## 2017-10-05 ENCOUNTER — Telehealth: Payer: Self-pay | Admitting: Family Medicine

## 2017-10-05 NOTE — Telephone Encounter (Signed)
Pt would like the letter to include the fact that he has slipping rib syndrome as well.

## 2017-10-05 NOTE — Telephone Encounter (Signed)
Please see message above/below

## 2017-10-05 NOTE — Telephone Encounter (Signed)
Letters printed and at the front ready for pick up.

## 2017-10-05 NOTE — Telephone Encounter (Signed)
Patient came is stating he is supposed to have a letter stating he is 15% disabled sent to C. Legrand Como Day the patient's therapist and the patient needs a copy also. If letter cannot be sent he'll come by and pick up the letter for him and the therapist.

## 2017-10-05 NOTE — Telephone Encounter (Signed)
Please see last office visit note.  5% disability rating is the most that can be provided and I am happy to give him a letter stating such.

## 2017-10-05 NOTE — Telephone Encounter (Signed)
error 

## 2017-10-10 ENCOUNTER — Ambulatory Visit (INDEPENDENT_AMBULATORY_CARE_PROVIDER_SITE_OTHER): Payer: PPO | Admitting: Sports Medicine

## 2017-10-10 ENCOUNTER — Encounter: Payer: Self-pay | Admitting: Sports Medicine

## 2017-10-10 VITALS — BP 130/76 | HR 69 | Ht 71.5 in | Wt 241.4 lb

## 2017-10-10 DIAGNOSIS — M9901 Segmental and somatic dysfunction of cervical region: Secondary | ICD-10-CM | POA: Diagnosis not present

## 2017-10-10 DIAGNOSIS — M9902 Segmental and somatic dysfunction of thoracic region: Secondary | ICD-10-CM | POA: Diagnosis not present

## 2017-10-10 DIAGNOSIS — M546 Pain in thoracic spine: Secondary | ICD-10-CM

## 2017-10-10 DIAGNOSIS — G8929 Other chronic pain: Secondary | ICD-10-CM

## 2017-10-10 DIAGNOSIS — M9903 Segmental and somatic dysfunction of lumbar region: Secondary | ICD-10-CM | POA: Diagnosis not present

## 2017-10-10 DIAGNOSIS — M9908 Segmental and somatic dysfunction of rib cage: Secondary | ICD-10-CM | POA: Diagnosis not present

## 2017-10-10 DIAGNOSIS — R0781 Pleurodynia: Secondary | ICD-10-CM

## 2017-10-10 NOTE — Telephone Encounter (Signed)
Paperwork/letter discussed at today's OV.
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
## 2017-10-13 ENCOUNTER — Other Ambulatory Visit: Payer: Self-pay | Admitting: Family Medicine

## 2017-10-13 DIAGNOSIS — L719 Rosacea, unspecified: Secondary | ICD-10-CM

## 2017-10-13 DIAGNOSIS — N5089 Other specified disorders of the male genital organs: Secondary | ICD-10-CM

## 2017-10-17 ENCOUNTER — Encounter: Payer: Self-pay | Admitting: Family Medicine

## 2017-10-17 ENCOUNTER — Ambulatory Visit (INDEPENDENT_AMBULATORY_CARE_PROVIDER_SITE_OTHER): Payer: PPO | Admitting: Family Medicine

## 2017-10-17 VITALS — BP 122/68 | HR 74 | Temp 97.2°F | Ht 69.0 in | Wt 238.0 lb

## 2017-10-17 DIAGNOSIS — E7800 Pure hypercholesterolemia, unspecified: Secondary | ICD-10-CM

## 2017-10-17 DIAGNOSIS — Z136 Encounter for screening for cardiovascular disorders: Secondary | ICD-10-CM | POA: Diagnosis not present

## 2017-10-17 DIAGNOSIS — N401 Enlarged prostate with lower urinary tract symptoms: Secondary | ICD-10-CM

## 2017-10-17 DIAGNOSIS — R972 Elevated prostate specific antigen [PSA]: Secondary | ICD-10-CM | POA: Diagnosis not present

## 2017-10-17 DIAGNOSIS — K2100 Gastro-esophageal reflux disease with esophagitis, without bleeding: Secondary | ICD-10-CM

## 2017-10-17 DIAGNOSIS — Z23 Encounter for immunization: Secondary | ICD-10-CM | POA: Diagnosis not present

## 2017-10-17 DIAGNOSIS — L719 Rosacea, unspecified: Secondary | ICD-10-CM

## 2017-10-17 DIAGNOSIS — E785 Hyperlipidemia, unspecified: Secondary | ICD-10-CM | POA: Diagnosis not present

## 2017-10-17 DIAGNOSIS — Z0000 Encounter for general adult medical examination without abnormal findings: Secondary | ICD-10-CM | POA: Diagnosis not present

## 2017-10-17 DIAGNOSIS — N5089 Other specified disorders of the male genital organs: Secondary | ICD-10-CM

## 2017-10-17 DIAGNOSIS — R351 Nocturia: Secondary | ICD-10-CM | POA: Diagnosis not present

## 2017-10-17 LAB — BASIC METABOLIC PANEL
BUN: 12 mg/dL (ref 6–23)
CALCIUM: 9.9 mg/dL (ref 8.4–10.5)
CO2: 30 meq/L (ref 19–32)
CREATININE: 0.73 mg/dL (ref 0.40–1.50)
Chloride: 104 mEq/L (ref 96–112)
GFR: 110.93 mL/min (ref >60.00)
GLUCOSE: 94 mg/dL (ref 70–99)
Potassium: 4.5 mEq/L (ref 3.5–5.1)
Sodium: 141 mEq/L (ref 135–145)

## 2017-10-17 LAB — CBC WITH DIFFERENTIAL/PLATELET
Basophils Absolute: 0 10*3/uL (ref 0.0–0.1)
Basophils Relative: 0.7 % (ref 0.0–3.0)
Eosinophils Absolute: 0.2 10*3/uL (ref 0.0–0.7)
Eosinophils Relative: 3.2 % (ref 0.0–5.0)
HCT: 38.8 % — ABNORMAL LOW (ref 39.0–52.0)
Hemoglobin: 13.4 g/dL (ref 13.0–17.0)
LYMPHS ABS: 1.8 10*3/uL (ref 0.7–4.0)
Lymphocytes Relative: 29.2 % (ref 12.0–46.0)
MCHC: 34.5 g/dL (ref 30.0–36.0)
MCV: 83.7 fl (ref 78.0–100.0)
MONO ABS: 0.5 10*3/uL (ref 0.1–1.0)
MONOS PCT: 8.9 % (ref 3.0–12.0)
NEUTROS ABS: 3.5 10*3/uL (ref 1.4–7.7)
NEUTROS PCT: 58 % (ref 43.0–77.0)
PLATELETS: 146 10*3/uL — AB (ref 150.0–400.0)
RBC: 4.64 Mil/uL (ref 4.22–5.81)
RDW: 13.8 % (ref 11.5–15.5)
WBC: 6 10*3/uL (ref 4.0–10.5)

## 2017-10-17 LAB — HEPATIC FUNCTION PANEL
ALBUMIN: 4.9 g/dL (ref 3.5–5.2)
ALK PHOS: 74 U/L (ref 39–117)
ALT: 27 U/L (ref 0–53)
AST: 18 U/L (ref 0–37)
Bilirubin, Direct: 0.2 mg/dL (ref 0.0–0.3)
Total Bilirubin: 1.1 mg/dL (ref 0.2–1.2)
Total Protein: 7.6 g/dL (ref 6.0–8.3)

## 2017-10-17 LAB — POCT URINALYSIS DIPSTICK
Bilirubin, UA: NEGATIVE
GLUCOSE UA: NEGATIVE
KETONES UA: NEGATIVE
Leukocytes, UA: NEGATIVE
Nitrite, UA: NEGATIVE
Protein, UA: POSITIVE — AB
RBC UA: NEGATIVE
SPEC GRAV UA: 1.015 (ref 1.010–1.025)
Urobilinogen, UA: 0.2 E.U./dL
pH, UA: 6 (ref 5.0–8.0)

## 2017-10-17 LAB — LIPID PANEL
CHOLESTEROL: 89 mg/dL (ref 0–200)
HDL: 23.7 mg/dL — AB (ref >39.00)
NONHDL: 65.73
Total CHOL/HDL Ratio: 4
Triglycerides: 323 mg/dL — ABNORMAL HIGH (ref 0.0–149.0)
VLDL: 64.6 mg/dL — ABNORMAL HIGH (ref 0.0–40.0)

## 2017-10-17 LAB — PSA: PSA: 4.85 ng/mL — ABNORMAL HIGH (ref 0.10–4.00)

## 2017-10-17 LAB — TSH: TSH: 2.35 u[IU]/mL (ref 0.35–4.50)

## 2017-10-17 LAB — LDL CHOLESTEROL, DIRECT: Direct LDL: 32 mg/dL

## 2017-10-17 MED ORDER — SIMVASTATIN 40 MG PO TABS: 40.0000 mg | ORAL_TABLET | Freq: Every day | ORAL | 3 refills | Status: DC

## 2017-10-17 MED ORDER — OXYBUTYNIN CHLORIDE 5 MG PO TABS: 5.0000 mg | ORAL_TABLET | Freq: Every morning | ORAL | 3 refills | Status: DC

## 2017-10-17 MED ORDER — DOXYCYCLINE HYCLATE 100 MG PO CAPS: 100.0000 mg | ORAL_CAPSULE | Freq: Every day | ORAL | 3 refills | Status: DC

## 2017-10-17 NOTE — Patient Instructions (Signed)
Labs today............ I will call you if there is anything abnormal  Stop the Zantac......... switch to Pepcid either 20 or 40 mg twice daily  Call your dermatologist and asked to be seen for evaluation of that lesion on your nose  I think Dr. Sarajane Jews or Georgina Snell would be to excellent options for you  Thanks for coming to see me all these years of my best to you and Matthew House
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
## 2017-10-18 ENCOUNTER — Ambulatory Visit (INDEPENDENT_AMBULATORY_CARE_PROVIDER_SITE_OTHER): Payer: PPO | Admitting: Family Medicine

## 2017-10-18 ENCOUNTER — Encounter: Payer: Self-pay | Admitting: Family Medicine

## 2017-10-18 ENCOUNTER — Other Ambulatory Visit (INDEPENDENT_AMBULATORY_CARE_PROVIDER_SITE_OTHER): Payer: PPO

## 2017-10-18 ENCOUNTER — Telehealth: Payer: Self-pay | Admitting: Family Medicine

## 2017-10-18 VITALS — Temp 98.5°F

## 2017-10-18 DIAGNOSIS — K2100 Gastro-esophageal reflux disease with esophagitis, without bleeding: Secondary | ICD-10-CM

## 2017-10-18 DIAGNOSIS — E785 Hyperlipidemia, unspecified: Secondary | ICD-10-CM

## 2017-10-18 DIAGNOSIS — E538 Deficiency of other specified B group vitamins: Secondary | ICD-10-CM

## 2017-10-18 DIAGNOSIS — H6121 Impacted cerumen, right ear: Secondary | ICD-10-CM

## 2017-10-18 DIAGNOSIS — R351 Nocturia: Secondary | ICD-10-CM | POA: Diagnosis not present

## 2017-10-18 DIAGNOSIS — R972 Elevated prostate specific antigen [PSA]: Secondary | ICD-10-CM

## 2017-10-18 DIAGNOSIS — G629 Polyneuropathy, unspecified: Secondary | ICD-10-CM

## 2017-10-18 DIAGNOSIS — N401 Enlarged prostate with lower urinary tract symptoms: Secondary | ICD-10-CM

## 2017-10-18 LAB — VITAMIN B12: VITAMIN B 12: 129 pg/mL — AB (ref 211–911)

## 2017-10-18 MED ORDER — FAMOTIDINE 40 MG PO TABS: 40.0000 mg | ORAL_TABLET | Freq: Two times a day (BID) | ORAL | 3 refills | Status: DC

## 2017-10-18 MED ORDER — FENOFIBRATE 160 MG PO TABS: 160.0000 mg | ORAL_TABLET | Freq: Every day | ORAL | 3 refills | Status: DC

## 2017-10-18 NOTE — Progress Notes (Signed)
   Subjective:    Patient ID: Matthew House, male    DOB: 29-Jan-1940, 77 y.o.   MRN: [id number]  HPI Here for an intro visit after transferring from Dr. [name]. He had a wellness exam with him yesterday and we reviewed all these results. His LDL was excellent but his TG were high as usual at 323. His PSA has been slowly climbing over the past 4 years and now it is up to 4.85. They noticed he has a small lesion on the nose at the site of a previous basal cell excision, so he is scheduled to see Dermatology for this in December. He has a cerumen impaction in the right ear that affects is hearing. OTC ear wax kits have not made any improvement. He is switching from Zantac to Pepcid for his GERD because Zantac is being pulled from the pharmacy for safety issues. Finally he complains of some mild intermittent tingling in the bottoms of both feet for several months. His labs yesterday showed a normal TSH and a normal glucose.    Review of Systems  Constitutional: Negative.   HENT: Positive for hearing loss. Negative for ear pain.   Respiratory: Negative.   Cardiovascular: Negative.   Gastrointestinal: Negative.   Genitourinary: Negative.   Neurological: Positive for numbness.       Objective:   Physical Exam  Constitutional: He is oriented to person, place, and time. He appears well-developed and well-nourished.  HENT:  Left Ear: External ear normal.  Nose: Nose normal.  Mouth/Throat: Oropharynx is clear and moist.  Right ear canal is full of cerumen   Cardiovascular: Normal rate, regular rhythm, normal heart sounds and intact distal pulses.  Pulmonary/Chest: Effort normal and breath sounds normal.  Neurological: He is alert and oriented to person, place, and time.          Assessment & Plan:  We discussed several issues today. We will send in Pepcid 40 mg to take BID for his GERD. We will check a B12 level today to look for a potential etiology for the neuropathy in his feet. We will  refer him to ENT for the cerumen impaction. We will refer him to Urology for the elevated PSA. He will start on Fenofibrate in addition to Zocor to get the TG down. We will recheck lipids in 90 days. He will see Dermatology for the nose lesion.  Alysia Penna, MD

## 2017-10-18 NOTE — Telephone Encounter (Signed)
FYI for Dr. Fry  

## 2017-10-18 NOTE — Telephone Encounter (Signed)
Patient states Matthew House told him that he could use the blood work from yesterday to do test for today.  Patient wanted to let Dr Sarajane Jews know why he didn't do labs today as requested.

## 2017-10-19 ENCOUNTER — Telehealth: Payer: Self-pay | Admitting: Family Medicine

## 2017-10-19 NOTE — Telephone Encounter (Signed)
Dr. [name] please advise on lab results once they are all back.  Thanks

## 2017-10-19 NOTE — Telephone Encounter (Addendum)
Patient was wanting to find out the results of his blood tests that he had done on 10/17/17 and 10/18/17.  He was wanting a call back when the results are ready.   Patient also states he doesn't need a referral for ENT now because the wax fell out of his ear this morning.

## 2017-10-23 ENCOUNTER — Other Ambulatory Visit (INDEPENDENT_AMBULATORY_CARE_PROVIDER_SITE_OTHER): Payer: PPO

## 2017-10-23 DIAGNOSIS — E538 Deficiency of other specified B group vitamins: Secondary | ICD-10-CM | POA: Diagnosis not present

## 2017-10-23 DIAGNOSIS — H6123 Impacted cerumen, bilateral: Secondary | ICD-10-CM | POA: Diagnosis not present

## 2017-10-23 DIAGNOSIS — H903 Sensorineural hearing loss, bilateral: Secondary | ICD-10-CM | POA: Diagnosis not present

## 2017-10-23 MED ORDER — CYANOCOBALAMIN 1000 MCG/ML IJ SOLN
1000.0000 ug | Freq: Once | INTRAMUSCULAR | Status: AC
  Administered 2017-10-23: 1000 ug via INTRAMUSCULAR

## 2017-10-24 NOTE — Telephone Encounter (Signed)
Left message for pt to return phone call some days ago. Called pt today and he stated that he received the phone call, came in this am, spoke to Dr. [name] and set an appt up for Tuesday am for b12 injection. No further action is needed!

## 2017-10-24 NOTE — Telephone Encounter (Signed)
Labs okay except for B12 level

## 2017-10-26 ENCOUNTER — Telehealth: Payer: Self-pay

## 2017-10-26 NOTE — Telephone Encounter (Signed)
Copied from [place] [id number]. Topic: General - Other >> Oct 26, 2017 12:36 PM Ivar Drape wrote: Reason for CRM:   Patient would like a call back from Dr. Barbie Banner assistant.  Please call him at [phone].  Patient would like to know if he is suppose to be taking a B12 tablet in between his B12 injections.

## 2017-10-30 NOTE — Telephone Encounter (Signed)
Called and spoke with pt and he is aware of only taking the shots and not the tablets.  He is aware and will cont with the shots.

## 2017-10-30 NOTE — Telephone Encounter (Signed)
No tablets are needed, just the shots

## 2017-10-31 ENCOUNTER — Ambulatory Visit (INDEPENDENT_AMBULATORY_CARE_PROVIDER_SITE_OTHER): Payer: PPO | Admitting: *Deleted

## 2017-10-31 DIAGNOSIS — E538 Deficiency of other specified B group vitamins: Secondary | ICD-10-CM | POA: Diagnosis not present

## 2017-10-31 MED ORDER — CYANOCOBALAMIN 1000 MCG/ML IJ SOLN
1000.0000 ug | Freq: Once | INTRAMUSCULAR | Status: AC
  Administered 2017-10-31: 1000 ug via INTRAMUSCULAR

## 2017-10-31 NOTE — Progress Notes (Signed)
Per orders of Dr. Fry, injection of B12 given by Shaneta Cervenka. Patient tolerated injection well. 

## 2017-11-07 ENCOUNTER — Ambulatory Visit: Payer: PPO | Admitting: Sports Medicine

## 2017-11-07 ENCOUNTER — Ambulatory Visit (INDEPENDENT_AMBULATORY_CARE_PROVIDER_SITE_OTHER): Payer: PPO

## 2017-11-07 DIAGNOSIS — E538 Deficiency of other specified B group vitamins: Secondary | ICD-10-CM

## 2017-11-07 MED ORDER — CYANOCOBALAMIN 1000 MCG/ML IJ SOLN
1000.0000 ug | Freq: Once | INTRAMUSCULAR | Status: AC
  Administered 2017-11-07: 1000 ug via INTRAMUSCULAR

## 2017-11-07 NOTE — Addendum Note (Signed)
Addended by: Rebecca Eaton on: 11/07/2017 09:32 AM   Modules accepted: Orders

## 2017-11-07 NOTE — Progress Notes (Addendum)
Per orders of Dr. Fry, injection of B12 given by Chaniece Barbato. Patient tolerated injection well.  

## 2017-11-14 ENCOUNTER — Ambulatory Visit (INDEPENDENT_AMBULATORY_CARE_PROVIDER_SITE_OTHER): Payer: PPO | Admitting: *Deleted

## 2017-11-14 DIAGNOSIS — E538 Deficiency of other specified B group vitamins: Secondary | ICD-10-CM | POA: Diagnosis not present

## 2017-11-14 MED ORDER — CYANOCOBALAMIN 1000 MCG/ML IJ SOLN
1000.0000 ug | Freq: Once | INTRAMUSCULAR | Status: AC
  Administered 2017-11-14: 1000 ug via INTRAMUSCULAR

## 2017-11-14 NOTE — Progress Notes (Signed)
Per orders of Dr. Fry, injection of B12 given by Donyetta Ogletree. Patient tolerated injection well. 

## 2017-11-21 ENCOUNTER — Other Ambulatory Visit: Payer: PPO

## 2017-11-21 ENCOUNTER — Other Ambulatory Visit (INDEPENDENT_AMBULATORY_CARE_PROVIDER_SITE_OTHER): Payer: PPO

## 2017-11-21 DIAGNOSIS — R200 Anesthesia of skin: Secondary | ICD-10-CM | POA: Diagnosis not present

## 2017-11-21 DIAGNOSIS — E538 Deficiency of other specified B group vitamins: Secondary | ICD-10-CM

## 2017-11-21 DIAGNOSIS — R5383 Other fatigue: Secondary | ICD-10-CM

## 2017-11-21 DIAGNOSIS — G629 Polyneuropathy, unspecified: Secondary | ICD-10-CM

## 2017-11-21 DIAGNOSIS — R202 Paresthesia of skin: Secondary | ICD-10-CM | POA: Diagnosis not present

## 2017-11-21 LAB — VITAMIN B12: VITAMIN B 12: 605 pg/mL (ref 211–911)

## 2017-11-21 LAB — VITAMIN D 25 HYDROXY (VIT D DEFICIENCY, FRACTURES)

## 2017-11-24 ENCOUNTER — Other Ambulatory Visit: Payer: Self-pay | Admitting: Family Medicine

## 2017-11-24 MED ORDER — VITAMIN D (ERGOCALCIFEROL) 1.25 MG (50000 UNIT) PO CAPS: 50000.0000 [IU] | ORAL_CAPSULE | ORAL | 0 refills | Status: DC

## 2017-11-27 ENCOUNTER — Ambulatory Visit: Payer: Self-pay | Admitting: *Deleted

## 2017-11-27 NOTE — Telephone Encounter (Signed)
Pt reports took first dose of Vit. D, 50.000 units on Saturday. States Sunday , "24 hours later" experienced metal taste in his mouth, "Felt very hot", nausea, no vomiting, and knees and hands  aching. Also reports experienced mild SOB with exertion, "Walking up the stairs."  Only symptom presently is metal taste. Pt questioning if this could be from the Vit. D.  Asking if there is an alternative.  Also states Dr. [name] told him last year he was lactose intolerant and he has not had any dairy, milk. Questioning if he should try  drinking milk again instead of medication. Please advise:  [phone] Reason for Disposition . Caller has NON-URGENT medication question about med that PCP prescribed and triager unable to answer question  Answer Assessment - Initial Assessment Questions 1. SYMPTOMS: "Do you have any symptoms?"     On Saturday, "Very hot", nausea, metal taste in mouth, knees, hands achy. Presently only with "Metal taste in mouth" 2. SEVERITY: If symptoms are present, ask "Are they mild, moderate or severe?"     They were moderate on Saturday  Protocols used: MEDICATION QUESTION CALL-A-AH

## 2017-11-27 NOTE — Telephone Encounter (Signed)
Dr. Fry please advise. Thanks  

## 2017-11-28 ENCOUNTER — Telehealth: Payer: Self-pay | Admitting: Family Medicine

## 2017-11-28 DIAGNOSIS — R972 Elevated prostate specific antigen [PSA]: Secondary | ICD-10-CM | POA: Diagnosis not present

## 2017-11-28 NOTE — Telephone Encounter (Signed)
Patient spoke to triage nurse yesterday and was told they would call him back by the end of the day yesterday.  Patient needs a call back about triage call referring to his Vitamin D.

## 2017-11-28 NOTE — Addendum Note (Signed)
Addended by: Elie Confer on: 11/28/2017 04:06 PM   Modules accepted: Orders

## 2017-11-28 NOTE — Telephone Encounter (Signed)
Spoke with pt already.  This is a duplicate message.

## 2017-11-28 NOTE — Telephone Encounter (Signed)
Spoke with pt and he is aware to change the vit d to 2000 units daily.  This has been changed on his med list.    Pt wanted to see if he could be checked somehow for being lactose intolerant.  He has not been able to have any type of dairy products in over a year and would like to be tested.  Dr. Sarajane Jews please advise. Thanks.

## 2017-11-28 NOTE — Telephone Encounter (Signed)
Stop the prescription vitamin D. Instead take OTC vitamin D 2000 units daily

## 2017-12-04 NOTE — Telephone Encounter (Signed)
There is no test for this. People simply stop eating dairy for awhile and see if they feel better or not

## 2017-12-05 ENCOUNTER — Ambulatory Visit (INDEPENDENT_AMBULATORY_CARE_PROVIDER_SITE_OTHER): Payer: PPO

## 2017-12-05 DIAGNOSIS — L570 Actinic keratosis: Secondary | ICD-10-CM | POA: Diagnosis not present

## 2017-12-05 DIAGNOSIS — E538 Deficiency of other specified B group vitamins: Secondary | ICD-10-CM

## 2017-12-05 DIAGNOSIS — L718 Other rosacea: Secondary | ICD-10-CM | POA: Diagnosis not present

## 2017-12-05 DIAGNOSIS — D485 Neoplasm of uncertain behavior of skin: Secondary | ICD-10-CM | POA: Diagnosis not present

## 2017-12-05 MED ORDER — CYANOCOBALAMIN 1000 MCG/ML IJ SOLN
1000.0000 ug | Freq: Once | INTRAMUSCULAR | Status: AC
  Administered 2017-12-05: 1000 ug via INTRAMUSCULAR

## 2017-12-05 NOTE — Telephone Encounter (Signed)
Called and spoke with pt and he is aware of Dr. Fry's recs.  

## 2017-12-17 ENCOUNTER — Encounter: Payer: Self-pay | Admitting: Sports Medicine

## 2017-12-17 NOTE — Assessment & Plan Note (Signed)
Ultimately we had long discussion once again today and the ongoing intermittent issues he will likely have with this.  5% disability rating once again appropriate and this was discussed with him.

## 2017-12-17 NOTE — Progress Notes (Signed)
PROCEDURE NOTE : OSTEOPATHIC MANIPULATION The decision today to treat with Osteopathic Manipulative Therapy (OMT) was based on physical exam findings. Verbal consent was obtained following a discussion with the patient regarding the of risks, benefits and potential side effects, including an acute pain flare,post manipulation soreness and need for repeat treatments.     Contraindications to OMT: NONE  Manipulation was performed as below: Regions treated: Cervical spine, Thoracic spine, Ribs and Lumbar spine OMT Techniques Used: HVLA, muscle energy and myofascial release  The patient tolerated the treatment well and reported Improved symptoms following treatment today. Patient was given medications, exercises, stretches and lifestyle modifications per AVS and verbally.   OSTEOPATHIC/STRUCTURAL EXAM:   CERVICAL: OA - rotated right C5 Flexed, rotated left THORACIC: T2 -8 Neutral, Rotated LEFT, Sidebent RIGHT RIBS: Rib 5 Right  Posterior LUMBAR: L3 FRS right (Flexed, Rotated & Sidebent)

## 2018-01-08 DIAGNOSIS — L578 Other skin changes due to chronic exposure to nonionizing radiation: Secondary | ICD-10-CM | POA: Diagnosis not present

## 2018-01-08 DIAGNOSIS — Z85828 Personal history of other malignant neoplasm of skin: Secondary | ICD-10-CM | POA: Diagnosis not present

## 2018-01-09 ENCOUNTER — Ambulatory Visit (INDEPENDENT_AMBULATORY_CARE_PROVIDER_SITE_OTHER): Payer: PPO

## 2018-01-09 DIAGNOSIS — E538 Deficiency of other specified B group vitamins: Secondary | ICD-10-CM

## 2018-01-09 MED ORDER — CYANOCOBALAMIN 1000 MCG/ML IJ SOLN
1000.0000 ug | Freq: Once | INTRAMUSCULAR | Status: AC
  Administered 2018-01-09: 1000 ug via INTRAMUSCULAR

## 2018-01-09 NOTE — Progress Notes (Signed)
Per orders of Dr. Sarajane Jews, injection of Cyanocobalamin given by Wyvonne Lenz. Patient tolerated injection well.

## 2018-01-10 ENCOUNTER — Ambulatory Visit (INDEPENDENT_AMBULATORY_CARE_PROVIDER_SITE_OTHER): Payer: PPO | Admitting: Family Medicine

## 2018-01-10 ENCOUNTER — Other Ambulatory Visit: Payer: Self-pay | Admitting: Family Medicine

## 2018-01-10 ENCOUNTER — Encounter: Payer: Self-pay | Admitting: Family Medicine

## 2018-01-10 VITALS — BP 140/72 | HR 70 | Temp 97.6°F | Wt 242.0 lb

## 2018-01-10 DIAGNOSIS — E559 Vitamin D deficiency, unspecified: Secondary | ICD-10-CM

## 2018-01-10 DIAGNOSIS — E538 Deficiency of other specified B group vitamins: Secondary | ICD-10-CM

## 2018-01-10 DIAGNOSIS — E785 Hyperlipidemia, unspecified: Secondary | ICD-10-CM | POA: Diagnosis not present

## 2018-01-10 NOTE — Progress Notes (Signed)
   Subjective:    Patient ID: Matthew House, male    DOB: 11-19-1940, 78 y.o.   MRN: [id number]  HPI Here to discuss medications. He has been taking a statin for 20 years and has always tolerated this well. His LDL in December was 32 and the TG was 323. However after we added Fenofibrate to this about 2 months ago he has been experiencing spells of weakness and body aches. He asks if he can stop the Fenofibrate. In general he is doing well. The BP is stable. He says he feels better every time he gets a B12 shot.    Review of Systems  Constitutional: Negative.   Respiratory: Negative.   Cardiovascular: Negative.   Musculoskeletal: Positive for myalgias.  Neurological: Positive for weakness.       Objective:   Physical Exam Constitutional:      Appearance: Normal appearance.  Cardiovascular:     Rate and Rhythm: Normal rate and regular rhythm.     Pulses: Normal pulses.     Heart sounds: Normal heart sounds.  Pulmonary:     Effort: Pulmonary effort is normal.     Breath sounds: Normal breath sounds.  Neurological:     General: No focal deficit present.     Mental Status: He is alert and oriented to person, place, and time.           Assessment & Plan:  He seems to be having side effects from the combination of Fenofibrate and Simvastatin. We will stop the Fenofibrate and get back to Simvastatin alone. Recheck as scheduled.  Alysia Penna, MD

## 2018-01-16 ENCOUNTER — Encounter: Payer: Self-pay | Admitting: Sports Medicine

## 2018-01-16 ENCOUNTER — Ambulatory Visit (INDEPENDENT_AMBULATORY_CARE_PROVIDER_SITE_OTHER): Payer: PPO | Admitting: Sports Medicine

## 2018-01-16 VITALS — BP 130/76 | HR 70 | Ht 69.0 in | Wt 240.4 lb

## 2018-01-16 DIAGNOSIS — M9902 Segmental and somatic dysfunction of thoracic region: Secondary | ICD-10-CM | POA: Diagnosis not present

## 2018-01-16 DIAGNOSIS — R0781 Pleurodynia: Secondary | ICD-10-CM | POA: Diagnosis not present

## 2018-01-16 DIAGNOSIS — M9908 Segmental and somatic dysfunction of rib cage: Secondary | ICD-10-CM

## 2018-01-16 DIAGNOSIS — M546 Pain in thoracic spine: Secondary | ICD-10-CM | POA: Diagnosis not present

## 2018-01-16 DIAGNOSIS — G8929 Other chronic pain: Secondary | ICD-10-CM

## 2018-01-16 DIAGNOSIS — M9901 Segmental and somatic dysfunction of cervical region: Secondary | ICD-10-CM | POA: Diagnosis not present

## 2018-01-16 NOTE — Progress Notes (Signed)
Matthew House. , [place] at [facility] [phone]  Matthew House - 78 y.o. male MRN [id number]  Date of birth: 1940/01/28  Visit Date: January 16, 2018  PCP: [name], MD   Referred by: [name], MD  SUBJECTIVE:  Chief Complaint  Patient presents with  . f/u rib pain    Takes Tylenol or Aleve prn. Has responded well to OMT in the past.     HPI: Patient is here today for acute flareup of his right thoracic rib cage.  He has been performing exercises on a 5 day/week basis since the new year and believes that this may have flared up his prior rib injury.  He had a difficult time sleeping and taking a deep breath last night.  REVIEW OF SYSTEMS: Per HPI  HISTORY:  Prior history reviewed and updated per electronic medical record.  Social History   Occupational History  . Occupation: retired Engineer, structural  Tobacco Use  . Smoking status: Former Smoker    Last attempt to quit: 01/03/1978    Years since quitting: 40.0  . Smokeless tobacco: Never Used  Substance and Sexual Activity  . Alcohol use: No    Alcohol/week: 0.0 standard drinks  . Drug use: No  . Sexual activity: Not on file   Social History   Social History Narrative   Lives with wife in a 2 story home.  Has 2 children.  Retired from Event organiser.  Education: some college.      DATA OBTAINED & REVIEWED:  Recent Labs    10/17/17 1114  CALCIUM 9.9  AST 18  ALT 27  TSH 2.35   No problems updated. No specialty comments available.  OBJECTIVE:  VS:  HT:5\' 9"  (175.3 cm)   WT:240 lb 6.4 oz (109 kg)  BMI:35.48    BP:130/76  HR:70bpm  TEMP: ( )  RESP:98 %   PHYSICAL EXAM: Adult male.  No acute distress.  Alert and appropriate.  Thoracic cage is tender to palpation over the right costochondral junction along the inferior portion.  He has poor mobility of the thoracic cage in this region.  No significant increased work of breathing.   ASSESSMENT    1. Rib pain on right side   2. Chronic right-sided thoracic back pain   3. Somatic dysfunction of cervical region   4. Somatic dysfunction of thoracic region   5. Somatic dysfunction of rib cage region       PROCEDURES:  PROCEDURE NOTE: OSTEOPATHIC MANIPULATION   The decision today to treat with Osteopathic Manipulative Therapy (OMT) was based on physical exam findings. Verbal consent was obtained following a discussion with the patient regarding the of risks, benefits and potential side effects, including an acute pain flare,post manipulation soreness and need for repeat treatments.  NONE  Manipulation was performed as below:  Regions Treated & Osteopathic Exam Findings   C3 FRS RIGHT (Flexed, Rotated & Sidebent) C6 Flexed, rotated RIGHT, sidebent RIGHT T6 FRS RIGHT (Flexed, Rotated & Sidebent) Poor thoracic cage mobility with inhalation dysfunction and tissue texture changes of the sternum.  Entire thoracic cage is slightly rotated left Rib 9Right  Inhalation dysfunction (depressed/exhaled)  OMT Techniques Used   HVLA muscle energy myofascial release  The patient tolerated the treatment well and reported Improved symptoms following treatment today. Patient was given medications, exercises, stretches and lifestyle modifications per AVS and verbally.    PLAN:  Pertinent additional documentation may be  included in corresponding procedure notes, imaging studies, problem based documentation and patient instructions.  No problem-specific Assessment & Plan notes found for this encounter. He responded well to osteopathic manipulation today.  Continue with home therapeutic exercises.  Activity modifications and the importance of avoiding exacerbating activities (limiting pain to no more than a 4 / 10 during or following activity) recommended and discussed. Discussed red flag symptoms that warrant earlier emergent evaluation and patient voices understanding. No orders of the defined  types were placed in this encounter. Lab Orders  No laboratory test(s) ordered today   Imaging Orders  No imaging studies ordered today   Referral Orders  No referral(s) requested today    At follow up will plan : repeat osteopathic manipulation Return if symptoms worsen or fail to improve.          [name], [place] [facility] Physician

## 2018-01-23 DIAGNOSIS — R972 Elevated prostate specific antigen [PSA]: Secondary | ICD-10-CM | POA: Diagnosis not present

## 2018-01-24 ENCOUNTER — Telehealth: Payer: Self-pay | Admitting: Family Medicine

## 2018-01-24 NOTE — Telephone Encounter (Signed)
The patient wanted to let you know that he went to the Dr. Nicolette Bang urologist and they said that they mapped his prostate and it is 125 cm and not 30 cm that is showing in his chart.

## 2018-01-24 NOTE — Telephone Encounter (Signed)
Will forward to Dr. Sarajane Jews to make him aware.

## 2018-02-01 DIAGNOSIS — C61 Malignant neoplasm of prostate: Secondary | ICD-10-CM | POA: Diagnosis not present

## 2018-02-05 ENCOUNTER — Telehealth: Payer: Self-pay | Admitting: *Deleted

## 2018-02-05 ENCOUNTER — Ambulatory Visit: Payer: PPO

## 2018-02-05 ENCOUNTER — Other Ambulatory Visit (INDEPENDENT_AMBULATORY_CARE_PROVIDER_SITE_OTHER): Payer: PPO

## 2018-02-05 DIAGNOSIS — C61 Malignant neoplasm of prostate: Secondary | ICD-10-CM | POA: Diagnosis not present

## 2018-02-05 DIAGNOSIS — E559 Vitamin D deficiency, unspecified: Secondary | ICD-10-CM

## 2018-02-05 DIAGNOSIS — E538 Deficiency of other specified B group vitamins: Secondary | ICD-10-CM

## 2018-02-05 LAB — VITAMIN B12: Vitamin B-12: 313 pg/mL (ref 211–911)

## 2018-02-05 LAB — VITAMIN D 25 HYDROXY (VIT D DEFICIENCY, FRACTURES): VITD: 18.83 ng/mL — AB (ref 30.00–100.00)

## 2018-02-05 MED ORDER — CYANOCOBALAMIN 1000 MCG/ML IJ SOLN
1000.0000 ug | Freq: Once | INTRAMUSCULAR | Status: AC
  Administered 2018-02-05: 1000 ug via INTRAMUSCULAR

## 2018-02-05 NOTE — Telephone Encounter (Signed)
Copied from [place] [id number]. Topic: General - Other >> Feb 01, 2018  1:32 PM Leward Quan A wrote: Patient called to say that he met with Urologist at  [facility] this morning and surgery will be scheduled on Monday 02/05/2018 to have the prostate taken out due to a cancerous tumor. Patient would like to say Thank You to Dr Sarajane Jews. Will be seeing Dr Tresa Moore on 02/05/2018.   FYI to Dr. Sarajane Jews.

## 2018-02-07 ENCOUNTER — Other Ambulatory Visit: Payer: Self-pay | Admitting: Urology

## 2018-02-08 ENCOUNTER — Other Ambulatory Visit: Payer: PPO

## 2018-02-09 ENCOUNTER — Ambulatory Visit: Payer: PPO

## 2018-02-16 DIAGNOSIS — M6281 Muscle weakness (generalized): Secondary | ICD-10-CM | POA: Diagnosis not present

## 2018-02-16 DIAGNOSIS — C61 Malignant neoplasm of prostate: Secondary | ICD-10-CM | POA: Diagnosis not present

## 2018-03-01 ENCOUNTER — Ambulatory Visit (INDEPENDENT_AMBULATORY_CARE_PROVIDER_SITE_OTHER): Payer: PPO | Admitting: Sports Medicine

## 2018-03-01 ENCOUNTER — Encounter: Payer: Self-pay | Admitting: Sports Medicine

## 2018-03-01 VITALS — BP 142/82 | HR 68

## 2018-03-01 DIAGNOSIS — M9908 Segmental and somatic dysfunction of rib cage: Secondary | ICD-10-CM

## 2018-03-01 DIAGNOSIS — M546 Pain in thoracic spine: Secondary | ICD-10-CM | POA: Diagnosis not present

## 2018-03-01 DIAGNOSIS — M9901 Segmental and somatic dysfunction of cervical region: Secondary | ICD-10-CM

## 2018-03-01 DIAGNOSIS — R0781 Pleurodynia: Secondary | ICD-10-CM | POA: Diagnosis not present

## 2018-03-01 DIAGNOSIS — G8929 Other chronic pain: Secondary | ICD-10-CM

## 2018-03-01 DIAGNOSIS — R0789 Other chest pain: Secondary | ICD-10-CM

## 2018-03-01 DIAGNOSIS — M9902 Segmental and somatic dysfunction of thoracic region: Secondary | ICD-10-CM | POA: Diagnosis not present
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
## 2018-03-02 DIAGNOSIS — M6281 Muscle weakness (generalized): Secondary | ICD-10-CM | POA: Diagnosis not present

## 2018-03-02 DIAGNOSIS — C61 Malignant neoplasm of prostate: Secondary | ICD-10-CM | POA: Diagnosis not present

## 2018-03-04 ENCOUNTER — Encounter: Payer: Self-pay | Admitting: Sports Medicine

## 2018-03-06 ENCOUNTER — Ambulatory Visit: Payer: PPO

## 2018-03-06 ENCOUNTER — Ambulatory Visit (INDEPENDENT_AMBULATORY_CARE_PROVIDER_SITE_OTHER): Payer: PPO | Admitting: *Deleted

## 2018-03-06 DIAGNOSIS — E538 Deficiency of other specified B group vitamins: Secondary | ICD-10-CM | POA: Diagnosis not present

## 2018-03-06 DIAGNOSIS — R351 Nocturia: Secondary | ICD-10-CM | POA: Diagnosis not present

## 2018-03-06 DIAGNOSIS — N401 Enlarged prostate with lower urinary tract symptoms: Secondary | ICD-10-CM | POA: Diagnosis not present

## 2018-03-06 DIAGNOSIS — C61 Malignant neoplasm of prostate: Secondary | ICD-10-CM | POA: Diagnosis not present

## 2018-03-06 MED ORDER — CYANOCOBALAMIN 1000 MCG/ML IJ SOLN
1000.0000 ug | Freq: Once | INTRAMUSCULAR | Status: AC
  Administered 2018-03-06: 1000 ug via INTRAMUSCULAR

## 2018-03-06 NOTE — Patient Instructions (Addendum)
Matthew House  03/06/2018   Your procedure is scheduled on: 03-14-18   Report to [facility] Main  Entrance               Report to admitting at    [place] AM    Call this number if you have problems the morning of surgery [phone]    Remember: Follow a clear liquid diet the day before your surgery per your MD orders Nothing after midnight               And drink one bottle of magnesium citrate by noon the day before surgery   [place], NO CHEWING GUM [place].     Take these medicines the morning of surgery with A SIP OF WATER: zantac, oxybutin, zyrtec                                You may not have any metal on your body including hair pins and              piercings  Do not wear jewelry, , lotions, powders or perfumes, deodorant                       Men may shave face and neck.   Do not bring valuables to the hospital. [place].  Contacts, dentures or bridgework may not be worn into surgery.  Leave suitcase in the car. After surgery it may be brought to your room.               Please read over the following fact sheets you were given: _____________________________________________________________________           [facility] - Preparing for Surgery Before surgery, you can play an important role.  Because skin is not sterile, your skin needs to be as free of germs as possible.  You can reduce the number of germs on your skin by washing with CHG (chlorahexidine gluconate) soap before surgery.  CHG is an antiseptic cleaner which kills germs and bonds with the skin to continue killing germs even after washing. Please DO NOT use if you have an allergy to CHG or antibacterial soaps.  If your skin becomes reddened/irritated stop using the CHG and inform your nurse when you arrive at Short Stay. Do not shave (including legs and underarms) for at  least 48 hours prior to the first CHG shower.  You may shave your face/neck. Please follow these instructions carefully:  1.  Shower with CHG Soap the night before surgery and the  morning of Surgery.  2.  If you choose to wash your hair, wash your hair first as usual with your  normal  shampoo.  3.  After you shampoo, rinse your hair and body thoroughly to remove the  shampoo.                           4.  Use CHG as you would any other liquid soap.  You can apply chg directly  to the skin and wash  Gently with a scrungie or clean washcloth.  5.  Apply the CHG Soap to your body ONLY FROM THE NECK DOWN.   Do not use on face/ open                           Wound or open sores. Avoid contact with eyes, ears mouth and genitals (private parts).                       Wash face,  Genitals (private parts) with your normal soap.             6.  Wash thoroughly, paying special attention to the area where your surgery  will be performed.  7.  Thoroughly rinse your body with warm water from the neck down.  8.  DO NOT shower/wash with your normal soap after using and rinsing off  the CHG Soap.                9.  Pat yourself dry with a clean towel.            10.  Wear clean pajamas.            11.  Place clean sheets on your bed the night of your first shower and do not  sleep with pets. Day of Surgery : Do not apply any lotions/deodorants the morning of surgery.  Please wear clean clothes to the hospital/surgery center.  FAILURE TO FOLLOW THESE INSTRUCTIONS MAY RESULT IN THE CANCELLATION OF YOUR SURGERY PATIENT SIGNATURE_________________________________  NURSE SIGNATURE__________________________________  ________________________________________________________________________    CLEAR LIQUID DIET   Foods Allowed                                                                     Foods Excluded  Coffee and tea, regular and decaf                             liquids that you  cannot  Plain Jell-O in any flavor                                             see through such as: Fruit ices (not with fruit pulp)                                     milk, soups, orange juice  Iced Popsicles                                    All solid food Carbonated beverages, regular and diet                                    Cranberry, grape and apple juices Sports drinks like Gatorade Lightly seasoned clear broth or consume(fat free) Sugar, honey syrup  Sample Menu Breakfast                                Lunch                                     Supper Cranberry juice                    Beef broth                            Chicken broth Jell-O                                     Grape juice                           Apple juice Coffee or tea                        Jell-O                                      Popsicle                                                Coffee or tea                        Coffee or tea  _____________________________________________________________________

## 2018-03-06 NOTE — Progress Notes (Signed)
Per orders of Dr. Fry, injection of Vit B12 given by GREEN, ASHTYN M. Patient tolerated injection well.  

## 2018-03-07 ENCOUNTER — Encounter (HOSPITAL_COMMUNITY)
Admission: RE | Admit: 2018-03-07 | Discharge: 2018-03-07 | Disposition: A | Discharge status: Home / Self Care | Payer: PPO | Source: Ambulatory Visit | Attending: Urology | Admitting: Urology

## 2018-03-07 ENCOUNTER — Encounter (HOSPITAL_COMMUNITY): Payer: Self-pay

## 2018-03-07 ENCOUNTER — Other Ambulatory Visit: Payer: Self-pay

## 2018-03-07 DIAGNOSIS — Z01812 Encounter for preprocedural laboratory examination: Secondary | ICD-10-CM | POA: Diagnosis not present

## 2018-03-07 HISTORY — DX: Malignant (primary) neoplasm, unspecified: C80.1

## 2018-03-07 LAB — CBC
HEMATOCRIT: 38.3 % — AB (ref 39.0–52.0)
Hemoglobin: 12.7 g/dL — ABNORMAL LOW (ref 13.0–17.0)
MCH: 29.2 pg (ref 26.0–34.0)
MCHC: 33.2 g/dL (ref 30.0–36.0)
MCV: 88 fL (ref 80.0–100.0)
Platelets: 147 10*3/uL — ABNORMAL LOW (ref 150–400)
RBC: 4.35 MIL/uL (ref 4.22–5.81)
RDW: 13.5 % (ref 11.5–15.5)
WBC: 6.4 10*3/uL (ref 4.0–10.5)
nRBC: 0 % (ref 0.0–0.2)

## 2018-03-07 LAB — BASIC METABOLIC PANEL
Anion gap: 9 (ref 5–15)
BUN: 14 mg/dL (ref 8–23)
CO2: 24 mmol/L (ref 22–32)
Calcium: 9.2 mg/dL (ref 8.9–10.3)
Chloride: 105 mmol/L (ref 98–111)
Creatinine, Ser: 0.76 mg/dL (ref 0.61–1.24)
GFR calc Af Amer: >60 mL/min (ref >60)
GFR calc non Af Amer: >60 mL/min (ref >60)
GLUCOSE: 116 mg/dL — AB (ref 70–99)
Potassium: 4.3 mmol/L (ref 3.5–5.1)
Sodium: 138 mmol/L (ref 135–145)

## 2018-03-07 NOTE — Progress Notes (Signed)
EKG 10-17-17 epic

## 2018-03-13 NOTE — Anesthesia Preprocedure Evaluation (Addendum)
Anesthesia Evaluation  Patient identified by MRN, date of birth, ID band Patient awake    Reviewed: Allergy & Precautions, NPO status , Patient's Chart, lab work & pertinent test results  Airway Mallampati: II  TM Distance: >3 FB Neck ROM: Full    Dental  (+) Teeth Intact, Dental Advisory Given   Pulmonary former smoker,    Pulmonary exam normal breath sounds clear to auscultation       Cardiovascular Exercise Tolerance: Good negative cardio ROS Normal cardiovascular exam Rhythm:Regular Rate:Normal     Neuro/Psych negative neurological ROS  negative psych ROS   GI/Hepatic Neg liver ROS, GERD  Medicated and Controlled,  Endo/Other  Obesity   Renal/GU negative Renal ROS   Prostate cancer    Musculoskeletal negative musculoskeletal ROS (+)   Abdominal   Peds  Hematology  (+) Blood dyscrasia (Thrombocytopenia), anemia ,   Anesthesia Other Findings Day of surgery medications reviewed with the patient.  Reproductive/Obstetrics                            Anesthesia Physical Anesthesia Plan  ASA: III  Anesthesia Plan: General   Post-op Pain Management:    Induction: Intravenous  PONV Risk Score and Plan: 4 or greater and Dexamethasone, Ondansetron and Treatment may vary due to age or medical condition  Airway Management Planned: Oral ETT  Additional Equipment:   Intra-op Plan:   Post-operative Plan: Extubation in OR  Informed Consent: I have reviewed the patients History and Physical, chart, labs and discussed the procedure including the risks, benefits and alternatives for the proposed anesthesia with the patient or authorized representative who has indicated his/her understanding and acceptance.     Dental advisory given  Plan Discussed with: CRNA  Anesthesia Plan Comments: (2nd IV after induction)       Anesthesia Quick Evaluation

## 2018-03-14 ENCOUNTER — Other Ambulatory Visit: Payer: Self-pay

## 2018-03-14 ENCOUNTER — Ambulatory Visit (HOSPITAL_COMMUNITY): Payer: PPO | Admitting: Certified Registered Nurse Anesthetist

## 2018-03-14 ENCOUNTER — Encounter (HOSPITAL_COMMUNITY)
Admission: RE | Disposition: A | Discharge status: Home / Self Care | Payer: Self-pay | Source: Home / Self Care | Attending: Urology

## 2018-03-14 ENCOUNTER — Inpatient Hospital Stay (HOSPITAL_COMMUNITY)
Admission: RE | Admit: 2018-03-14 | Discharge: 2018-03-16 | DRG: 708 | Disposition: A | Discharge status: Home / Self Care | Payer: PPO | Attending: Urology | Admitting: Urology

## 2018-03-14 ENCOUNTER — Encounter (HOSPITAL_COMMUNITY): Payer: Self-pay | Admitting: *Deleted

## 2018-03-14 ENCOUNTER — Ambulatory Visit (HOSPITAL_COMMUNITY): Payer: PPO | Admitting: Physician Assistant

## 2018-03-14 DIAGNOSIS — N400 Enlarged prostate without lower urinary tract symptoms: Secondary | ICD-10-CM | POA: Diagnosis present

## 2018-03-14 DIAGNOSIS — Z79899 Other long term (current) drug therapy: Secondary | ICD-10-CM | POA: Diagnosis not present

## 2018-03-14 DIAGNOSIS — Z888 Allergy status to other drugs, medicaments and biological substances status: Secondary | ICD-10-CM | POA: Diagnosis not present

## 2018-03-14 DIAGNOSIS — K219 Gastro-esophageal reflux disease without esophagitis: Secondary | ICD-10-CM | POA: Diagnosis present

## 2018-03-14 DIAGNOSIS — Z87891 Personal history of nicotine dependence: Secondary | ICD-10-CM

## 2018-03-14 DIAGNOSIS — C61 Malignant neoplasm of prostate: Principal | ICD-10-CM | POA: Diagnosis present

## 2018-03-14 DIAGNOSIS — Z7982 Long term (current) use of aspirin: Secondary | ICD-10-CM

## 2018-03-14 DIAGNOSIS — R319 Hematuria, unspecified: Secondary | ICD-10-CM | POA: Diagnosis not present

## 2018-03-14 DIAGNOSIS — E785 Hyperlipidemia, unspecified: Secondary | ICD-10-CM | POA: Diagnosis present

## 2018-03-14 DIAGNOSIS — Z85828 Personal history of other malignant neoplasm of skin: Secondary | ICD-10-CM

## 2018-03-14 HISTORY — PX: LYMPHADENECTOMY: SHX5960

## 2018-03-14 HISTORY — PX: ROBOT ASSISTED LAPAROSCOPIC RADICAL PROSTATECTOMY: SHX5141

## 2018-03-14 LAB — HEMOGLOBIN AND HEMATOCRIT, BLOOD
HCT: 35.1 % — ABNORMAL LOW (ref 39.0–52.0)
Hemoglobin: 11.7 g/dL — ABNORMAL LOW (ref 13.0–17.0)

## 2018-03-14 SURGERY — PROSTATECTOMY, RADICAL, ROBOT-ASSISTED, LAPAROSCOPIC
Anesthesia: General

## 2018-03-14 MED ORDER — DEXAMETHASONE SODIUM PHOSPHATE 4 MG/ML IJ SOLN
INTRAMUSCULAR | Status: DC | PRN
  Administered 2018-03-14: 4 mg via INTRAVENOUS

## 2018-03-14 MED ORDER — FAMOTIDINE 20 MG PO TABS
40.0000 mg | ORAL_TABLET | Freq: Every day | ORAL | Status: DC
  Administered 2018-03-15: 40 mg via ORAL
  Filled 2018-03-14 (×2): qty 2

## 2018-03-14 MED ORDER — HYDROCODONE-ACETAMINOPHEN 5-325 MG PO TABS: 1.0000 | ORAL_TABLET | Freq: Four times a day (QID) | ORAL | 0 refills | Status: DC | PRN

## 2018-03-14 MED ORDER — CEFAZOLIN SODIUM-DEXTROSE 2-4 GM/100ML-% IV SOLN
2.0000 g | INTRAVENOUS | Status: AC
  Administered 2018-03-14: 2 g via INTRAVENOUS
  Filled 2018-03-14: qty 100

## 2018-03-14 MED ORDER — ALUM & MAG HYDROXIDE-SIMETH 200-200-20 MG/5ML PO SUSP
15.0000 mL | ORAL | Status: DC | PRN
  Administered 2018-03-14: 15 mL via ORAL
  Filled 2018-03-14 (×2): qty 30

## 2018-03-14 MED ORDER — LIDOCAINE 2% (20 MG/ML) 5 ML SYRINGE
INTRAMUSCULAR | Status: AC
  Filled 2018-03-14: qty 5

## 2018-03-14 MED ORDER — ROCURONIUM BROMIDE 10 MG/ML (PF) SYRINGE
PREFILLED_SYRINGE | INTRAVENOUS | Status: DC | PRN
  Administered 2018-03-14: 90 mg via INTRAVENOUS
  Administered 2018-03-14: 20 mg via INTRAVENOUS
  Administered 2018-03-14: 10 mg via INTRAVENOUS

## 2018-03-14 MED ORDER — HYDROMORPHONE HCL 1 MG/ML IJ SOLN
0.5000 mg | INTRAMUSCULAR | Status: DC | PRN
  Administered 2018-03-14 – 2018-03-15 (×3): 1 mg via INTRAVENOUS
  Filled 2018-03-14 (×3): qty 1

## 2018-03-14 MED ORDER — LACTATED RINGERS IR SOLN
Status: DC | PRN
  Administered 2018-03-14: 1000 mL

## 2018-03-14 MED ORDER — FENTANYL CITRATE (PF) 250 MCG/5ML IJ SOLN
INTRAMUSCULAR | Status: AC
  Filled 2018-03-14: qty 5

## 2018-03-14 MED ORDER — LACTATED RINGERS IV SOLN
INTRAVENOUS | Status: DC | PRN
  Administered 2018-03-14 (×2): via INTRAVENOUS

## 2018-03-14 MED ORDER — FENTANYL CITRATE (PF) 100 MCG/2ML IJ SOLN
INTRAMUSCULAR | Status: AC
  Filled 2018-03-14: qty 2

## 2018-03-14 MED ORDER — LIDOCAINE 2% (20 MG/ML) 5 ML SYRINGE
INTRAMUSCULAR | Status: DC | PRN
  Administered 2018-03-14: 60 mg via INTRAVENOUS

## 2018-03-14 MED ORDER — ACETAMINOPHEN 500 MG PO TABS
1000.0000 mg | ORAL_TABLET | Freq: Once | ORAL | Status: AC
  Administered 2018-03-14: 1000 mg via ORAL
  Filled 2018-03-14: qty 2

## 2018-03-14 MED ORDER — ONDANSETRON HCL 4 MG/2ML IJ SOLN: 4.0000 mg | Freq: Once | INTRAMUSCULAR | Status: DC | PRN

## 2018-03-14 MED ORDER — FENTANYL CITRATE (PF) 100 MCG/2ML IJ SOLN
INTRAMUSCULAR | Status: DC | PRN
  Administered 2018-03-14 (×7): 50 ug via INTRAVENOUS

## 2018-03-14 MED ORDER — PROPOFOL 10 MG/ML IV BOLUS
INTRAVENOUS | Status: DC | PRN
  Administered 2018-03-14: 150 mg via INTRAVENOUS

## 2018-03-14 MED ORDER — MAGNESIUM CITRATE PO SOLN: 1.0000 | Freq: Once | ORAL | Status: DC

## 2018-03-14 MED ORDER — LACTATED RINGERS IV SOLN
INTRAVENOUS | Status: DC
  Administered 2018-03-14: 08:00:00 via INTRAVENOUS

## 2018-03-14 MED ORDER — BUPIVACAINE LIPOSOME 1.3 % IJ SUSP
20.0000 mL | Freq: Once | INTRAMUSCULAR | Status: AC
  Administered 2018-03-14: 20 mL
  Filled 2018-03-14: qty 20

## 2018-03-14 MED ORDER — SODIUM CHLORIDE 0.9 % IV BOLUS
1000.0000 mL | Freq: Once | INTRAVENOUS | Status: AC
  Administered 2018-03-14: 1000 mL via INTRAVENOUS

## 2018-03-14 MED ORDER — OXYCODONE HCL 5 MG PO TABS
5.0000 mg | ORAL_TABLET | ORAL | Status: DC | PRN
  Administered 2018-03-15 – 2018-03-16 (×4): 5 mg via ORAL
  Administered 2018-03-16: 10 mg via ORAL
  Filled 2018-03-14 (×2): qty 1
  Filled 2018-03-14 (×2): qty 2
  Filled 2018-03-14 (×2): qty 1

## 2018-03-14 MED ORDER — FENTANYL CITRATE (PF) 100 MCG/2ML IJ SOLN
25.0000 ug | INTRAMUSCULAR | Status: DC | PRN
  Administered 2018-03-14 (×3): 50 ug via INTRAVENOUS

## 2018-03-14 MED ORDER — ONDANSETRON HCL 4 MG/2ML IJ SOLN: 4.0000 mg | INTRAMUSCULAR | Status: DC | PRN

## 2018-03-14 MED ORDER — ROCURONIUM BROMIDE 10 MG/ML (PF) SYRINGE
PREFILLED_SYRINGE | INTRAVENOUS | Status: AC
  Filled 2018-03-14: qty 10

## 2018-03-14 MED ORDER — ACETAMINOPHEN 500 MG PO TABS
1000.0000 mg | ORAL_TABLET | Freq: Four times a day (QID) | ORAL | Status: AC
  Administered 2018-03-14 (×3): 1000 mg via ORAL
  Filled 2018-03-14 (×4): qty 2

## 2018-03-14 MED ORDER — FENTANYL CITRATE (PF) 100 MCG/2ML IJ SOLN
INTRAMUSCULAR | Status: AC
  Administered 2018-03-14: 50 ug via INTRAVENOUS
  Filled 2018-03-14: qty 2

## 2018-03-14 MED ORDER — MIDAZOLAM HCL 2 MG/2ML IJ SOLN
INTRAMUSCULAR | Status: AC
  Filled 2018-03-14: qty 2

## 2018-03-14 MED ORDER — ONDANSETRON HCL 4 MG/2ML IJ SOLN
INTRAMUSCULAR | Status: DC | PRN
  Administered 2018-03-14: 4 mg via INTRAVENOUS

## 2018-03-14 MED ORDER — PROPOFOL 10 MG/ML IV BOLUS
INTRAVENOUS | Status: AC
  Filled 2018-03-14: qty 20

## 2018-03-14 MED ORDER — SIMVASTATIN 40 MG PO TABS
40.0000 mg | ORAL_TABLET | Freq: Every day | ORAL | Status: DC
  Administered 2018-03-14 – 2018-03-15 (×2): 40 mg via ORAL
  Filled 2018-03-14 (×2): qty 1

## 2018-03-14 MED ORDER — LORATADINE 10 MG PO TABS
10.0000 mg | ORAL_TABLET | Freq: Every day | ORAL | Status: DC
  Administered 2018-03-14 – 2018-03-15 (×2): 10 mg via ORAL
  Filled 2018-03-14 (×3): qty 1

## 2018-03-14 MED ORDER — SULFAMETHOXAZOLE-TRIMETHOPRIM 800-160 MG PO TABS: 1.0000 | ORAL_TABLET | Freq: Two times a day (BID) | ORAL | 0 refills | Status: DC

## 2018-03-14 MED ORDER — SUGAMMADEX SODIUM 500 MG/5ML IV SOLN
INTRAVENOUS | Status: AC
  Filled 2018-03-14: qty 5

## 2018-03-14 MED ORDER — DEXTROSE-NACL 5-0.45 % IV SOLN
INTRAVENOUS | Status: DC
  Administered 2018-03-14 – 2018-03-15 (×2): via INTRAVENOUS

## 2018-03-14 MED ORDER — BELLADONNA ALKALOIDS-OPIUM 16.2-60 MG RE SUPP
1.0000 | Freq: Three times a day (TID) | RECTAL | Status: DC | PRN
  Administered 2018-03-14 – 2018-03-15 (×2): 1 via RECTAL
  Filled 2018-03-14 (×2): qty 1

## 2018-03-14 MED ORDER — STERILE WATER FOR IRRIGATION IR SOLN
Status: DC | PRN
  Administered 2018-03-14: 1000 mL

## 2018-03-14 MED ORDER — DEXAMETHASONE SODIUM PHOSPHATE 10 MG/ML IJ SOLN
INTRAMUSCULAR | Status: AC
  Filled 2018-03-14: qty 1

## 2018-03-14 MED ORDER — SUGAMMADEX SODIUM 200 MG/2ML IV SOLN
INTRAVENOUS | Status: DC | PRN
  Administered 2018-03-14: 400 mg via INTRAVENOUS

## 2018-03-14 MED ORDER — SODIUM CHLORIDE (PF) 0.9 % IJ SOLN
INTRAMUSCULAR | Status: AC
  Filled 2018-03-14: qty 20

## 2018-03-14 MED ORDER — ONDANSETRON HCL 4 MG/2ML IJ SOLN
INTRAMUSCULAR | Status: AC
  Filled 2018-03-14: qty 2

## 2018-03-14 SURGICAL SUPPLY — 64 items
APPLICATOR COTTON TIP 6 STRL (MISCELLANEOUS) ×2 IMPLANT
APPLICATOR COTTON TIP 6IN STRL (MISCELLANEOUS) ×4
CATH FOLEY 2WAY SLVR 18FR 30CC (CATHETERS) ×4 IMPLANT
CATH TIEMANN FOLEY 18FR 5CC (CATHETERS) ×4 IMPLANT
CHLORAPREP W/TINT 26ML (MISCELLANEOUS) ×4 IMPLANT
CLIP VESOLOCK LG 6/CT PURPLE (CLIP) ×14 IMPLANT
CONT SPEC 4OZ CLIKSEAL STRL BL (MISCELLANEOUS) ×4 IMPLANT
COVER TIP SHEARS 8 DVNC (MISCELLANEOUS) ×2 IMPLANT
COVER TIP SHEARS 8MM DA VINCI (MISCELLANEOUS) ×2
COVER WAND RF STERILE (DRAPES) IMPLANT
CUTTER ECHEON FLEX ENDO 45 340 (ENDOMECHANICALS) ×4 IMPLANT
DECANTER SPIKE VIAL GLASS SM (MISCELLANEOUS) ×4 IMPLANT
DERMABOND ADVANCED (GAUZE/BANDAGES/DRESSINGS) ×2
DERMABOND ADVANCED .7 DNX12 (GAUZE/BANDAGES/DRESSINGS) ×2 IMPLANT
DRAPE ARM DVNC X/XI (DISPOSABLE) ×8 IMPLANT
DRAPE COLUMN DVNC XI (DISPOSABLE) ×2 IMPLANT
DRAPE DA VINCI XI ARM (DISPOSABLE) ×8
DRAPE DA VINCI XI COLUMN (DISPOSABLE) ×2
DRAPE SURG IRRIG POUCH 19X23 (DRAPES) ×4 IMPLANT
DRSG TEGADERM 4X4.75 (GAUZE/BANDAGES/DRESSINGS) ×4 IMPLANT
ELECT PENCIL ROCKER SW 15FT (MISCELLANEOUS) ×4 IMPLANT
ELECT REM PT RETURN 15FT ADLT (MISCELLANEOUS) ×4 IMPLANT
GAUZE SPONGE 2X2 8PLY STRL LF (GAUZE/BANDAGES/DRESSINGS) IMPLANT
GLOVE BIO SURGEON STRL SZ 6.5 (GLOVE) ×3 IMPLANT
GLOVE BIO SURGEONS STRL SZ 6.5 (GLOVE) ×1
GLOVE BIOGEL M STRL SZ7.5 (GLOVE) ×8 IMPLANT
GLOVE BIOGEL PI IND STRL 7.5 (GLOVE) ×2 IMPLANT
GLOVE BIOGEL PI INDICATOR 7.5 (GLOVE) ×4
GOWN STRL REUS W/TWL LRG LVL3 (GOWN DISPOSABLE) ×12 IMPLANT
HOLDER FOLEY CATH W/STRAP (MISCELLANEOUS) ×4 IMPLANT
IRRIG SUCT STRYKERFLOW 2 WTIP (MISCELLANEOUS) ×4
IRRIGATION SUCT STRKRFLW 2 WTP (MISCELLANEOUS) ×2 IMPLANT
IV LACTATED RINGERS 1000ML (IV SOLUTION) ×4 IMPLANT
KIT PROCEDURE DA VINCI SI (MISCELLANEOUS) ×2
KIT PROCEDURE DVNC SI (MISCELLANEOUS) ×2 IMPLANT
KIT TURNOVER KIT A (KITS) ×2 IMPLANT
NDL INSUFFLATION 14GA 120MM (NEEDLE) ×2 IMPLANT
NDL SPNL 22GX7 QUINCKE BK (NEEDLE) ×2 IMPLANT
NEEDLE INSUFFLATION 14GA 120MM (NEEDLE) ×4 IMPLANT
NEEDLE SPNL 22GX7 QUINCKE BK (NEEDLE) ×4 IMPLANT
PACK ROBOT UROLOGY CUSTOM (CUSTOM PROCEDURE TRAY) ×4 IMPLANT
PAD POSITIONING PINK XL (MISCELLANEOUS) ×4 IMPLANT
PORT ACCESS TROCAR AIRSEAL 12 (TROCAR) ×2 IMPLANT
PORT ACCESS TROCAR AIRSEAL 5M (TROCAR) ×2
RELOAD STAPLE 45 4.1 GRN THCK (STAPLE) ×2 IMPLANT
SEAL CANN UNIV 5-8 DVNC XI (MISCELLANEOUS) ×8 IMPLANT
SEAL XI 5MM-8MM UNIVERSAL (MISCELLANEOUS) ×8
SET TRI-LUMEN FLTR TB AIRSEAL (TUBING) ×4 IMPLANT
SOLUTION ELECTROLUBE (MISCELLANEOUS) ×4 IMPLANT
SPONGE GAUZE 2X2 STER 10/PKG (GAUZE/BANDAGES/DRESSINGS)
SPONGE LAP 4X18 RFD (DISPOSABLE) ×4 IMPLANT
STAPLE RELOAD 45 GRN (STAPLE) ×2 IMPLANT
STAPLE RELOAD 45MM GREEN (STAPLE) ×2
SUT ETHILON 3 0 PS 1 (SUTURE) ×4 IMPLANT
SUT MNCRL AB 4-0 PS2 18 (SUTURE) ×8 IMPLANT
SUT PDS AB 1 CT1 27 (SUTURE) ×8 IMPLANT
SUT VIC AB 2-0 SH 27 (SUTURE) ×2
SUT VIC AB 2-0 SH 27X BRD (SUTURE) ×2 IMPLANT
SUT VICRYL 0 UR6 27IN ABS (SUTURE) ×4 IMPLANT
SUT VLOC BARB 180 ABS3/0GR12 (SUTURE) ×12
SUTURE VLOC BRB 180 ABS3/0GR12 (SUTURE) ×6 IMPLANT
SYR 27GX1/2 1ML LL SAFETY (SYRINGE) ×4 IMPLANT
TOWEL OR NON WOVEN STRL DISP B (DISPOSABLE) ×4 IMPLANT
WATER STERILE IRR 1000ML POUR (IV SOLUTION) ×4 IMPLANT

## 2018-03-14 NOTE — Brief Op Note (Signed)
03/14/2018  1:05 PM  PATIENT:  Matthew House  78 y.o. male  PRE-OPERATIVE DIAGNOSIS:  PROSTATE CANCER IN LARGE PROSTATE  POST-OPERATIVE DIAGNOSIS:  PROSTATE CANCER IN LARGE PROSTATE  PROCEDURE:  Procedure(s) with comments: XI ROBOTIC ASSISTED LAPAROSCOPIC RADICAL PROSTATECTOMY (N/A) - 3 HRS LYMPHADENECTOMY (Bilateral)  SURGEON:  Surgeon(s) and Role:    Alexis Frock, MD - Primary  PHYSICIAN ASSISTANT:   ASSISTANTS: Clemetine Marker PA   ANESTHESIA:   local and general  EBL:  200 mL   BLOOD ADMINISTERED:none  DRAINS: 1- JP to bulb, 2 - Foley to gravity   LOCAL MEDICATIONS USED:  MARCAINE     SPECIMEN:  Source of Specimen:  1 -periprostatic fat; 2 - prostatecotmy; 3 - pelvic lymph nodes  DISPOSITION OF SPECIMEN:  PATHOLOGY  COUNTS:  YES  TOURNIQUET:  * No tourniquets in log *  DICTATION: .Other Dictation: Dictation Number [id number]  PLAN OF CARE: Admit for overnight observation  PATIENT DISPOSITION:  PACU - hemodynamically stable.   Delay start of Pharmacological VTE agent (>24hrs) due to surgical blood loss or risk of bleeding: yes

## 2018-03-14 NOTE — Progress Notes (Signed)
PACU Nsg note:  Pt's foley catheter irrigated with 400 ml of Normal saline, pulled 350 ml back.

## 2018-03-14 NOTE — Anesthesia Procedure Notes (Signed)
Procedure Name: Intubation Date/Time: 03/14/2018 8:38 AM Performed by: Claudia Desanctis, CRNA Pre-anesthesia Checklist: Patient identified, Emergency Drugs available, Suction available and Patient being monitored Patient Re-evaluated:Patient Re-evaluated prior to induction Oxygen Delivery Method: Circle system utilized Preoxygenation: Pre-oxygenation with 100% oxygen Induction Type: IV induction Ventilation: Mask ventilation without difficulty Laryngoscope Size: 2 and Miller Grade View: Grade II Tube type: Oral Tube size: 7.5 mm Number of attempts: 1 Airway Equipment and Method: Stylet Placement Confirmation: ETT inserted through vocal cords under direct vision,  positive ETCO2 and breath sounds checked- equal and bilateral Secured at: 23 cm Tube secured with: Tape Dental Injury: Teeth and Oropharynx as per pre-operative assessment

## 2018-03-14 NOTE — Op Note (Signed)
NAME: Matthew House, Matthew House MEDICAL RECORD PI:[id number] ACCOUNT [id number] DATE OF BIRTH:[birth year] FACILITY: [facility] LOCATION: [facility] PHYSICIAN:Noelani Harbach, MD  OPERATIVE REPORT  DATE OF PROCEDURE:  03/14/2018  PREOPERATIVE DIAGNOSIS:  Moderate risk prostate cancer and very large prostate.  PROCEDURES: 1.  Robotic-assisted laparoscopic radical prostatectomy. 2.  Bilateral pelvic lymphadenectomy.  ESTIMATED BLOOD LOSS:  606 mL  COMPLICATIONS:  None.  SPECIMENS: 1.  Periprostatic fat. 2.  Right external iliac lymph nodes. 3.  Right obturator lymph nodes. 4.  Left external iliac lymph nodes. 5.  Left obturator lymph nodes, sentinel 6.  Left internal iliac lymph nodes, sentinel 7.  Radical prostatectomy.  FINDINGS: 1.  Large bilobar prostatic hypertrophy as anticipated. 2.  Sentinel lymph node in the left side denoted as such on pathology report.  INDICATIONS:  The patient is a very pleasant and quite vigorous 78 year old man who was found on workup of elevated PSA to have a fairly large volume multifocal moderate risk adenocarcinoma of the prostate.  His prostate is also quite large at over 100  grams calculated volume.  Options were discussed for management including surveillance protocols versus ablative therapy versus surgical extirpation and he wished to proceed with prostatectomy with curative intent.  Informed consent was then placed in  medical record.  DESCRIPTION OF PROCEDURE:  The patient was identified.  Procedure being radical prostatectomy was confirmed.  Procedure timeout was performed.  Antibiotics administered.  General endotracheal anesthesia induced.  The patient was placed into a low  lithotomy position, sterile field was created prepped and draped base of the penis, perineum and proximal thighs using iodine and his infra-xiphoid abdomen using chlorhexidine gluconate after clipper shaving and further fashioning to the operative table  using 3-inch tape over foam  padding, A test of steep Trendelenburg positioning was performed and found to be suitably positioned.  His arms were tucked at his side using gel rolls.  Next, a high-flow, low-pressure pneumoperitoneum was obtained using  Veress technique in the supraumbilical midline having passed the aspiration and drop test and after placing a Foley catheter per urethra straight drain.  An 8 mm robotic camera port was then placed in the same location.  Laparoscopic examination  peritoneal cavity revealed no significant adhesions, no visceral injury.  There was some moderate redundancy of the sigmoid without any evidence of active diverticulitis.  Additional ports were placed as follows:  Right paramedian 8 mm robotic port,  right far lateral 12 mm assist port, right paramedian 5 mm suction port, left paramedian 8 mm robotic port, left far lateral 8 mm robotic port.  Robot was docked and passed electronic checks.  Attention was directed at developing the space of Retzius.   Incision was made lateral to the right medial umbilical ligament from the midline towards the area of the internal ring coursing along the iliac vessels towards the area of the right ureter.  The right vas deferens was encountered and purposely ligated  and used medial bucket handle as the right bladder wall was swept away from the pelvic sidewall towards the endopelvic fascia on the right side.  A mirror image dissection performed on the left side.  Anterior attachments were taken down using cautery  scissors.  This exposed the anterior base of the prostate.  This area was defatted to better denote the bladder neck and prostate junction with this tissue was set aside labeled as periprosthetic fat.  Next, 0.2 mL venous indocyanine green dye was  injected into each lobe of the prostate using  a percutaneously placed robotically guided spinal needle with intervening suctioning to prevent dye spillage, which did not occur.  Next, the endopelvic fascia was  carefully swept away from the lateral aspect  of the prostate and base to apex orientation.  This exposed the dorsal venous complex which was controlled using vascular stapler, taking exquisite care to avoid membranous urethral injury which did not occur.  Then, approximately 10 minutes post-dye  injection the pelvis was inspected under near infrared fluorescence light.  Sentinel lymph angiography revealed excellent prostatic parenchymal uptake of the dye.  There were several lymphatic channels seen coursing towards the area of the pelvic lymph node fields on both sides with sentinel lymphatic tissue seen on the left,  but not obviously on the right.  Right-sided template lymphadenectomy was performed first to the right external iliac group with the boundaries being right external iliac artery, vein, pelvic sidewall, iliac bifurcation.  Lymphostasis achieved with cold  clips.  Next, the right obturator group was dissected free with the boundaries being right external iliac vein, pelvic sidewall, obturator nerve.  Lymphostasis achieved with cold clips.  The obturator nerve was inspected following maneuvers and found to  be uninjured.  A mirror image lymphadenectomy was performed on the left external iliac, left obturator group respectively.  The left obturator group did harbor some sentinel lymph nodes and they were demarcated as such.  There was also some channel seen  coursing towards the area of the internal iliac field and there was an area of possible sentinel lymph node versus a slight extravasation from the lymphatic channel.  This area was dissected free, set aside, labeled as such.  Lymphostasis achieved with  cold clips.  Attention was directed at bladder neck dissection.  Given the very large volume of the prostate, a lateral release was performed on each side to better demarcate the bladder neck, prostate junction, which was then separated in an anterior to  posterior direction, keeping what  appeared to be a rim of circular muscle fibers at each plane of dissection.  This resulted in excellent bladder neck caliber and preservation.  Next, posterior dissection was performed by incising approximately 1 cm  inferior posterior to the posterior lip of the prostate, taking exquisite care to avoid buttonhole of the bladder and the posterior plane was encountered along the plane of Denonvilliers.  Bilateral vas deferens were encountered, dissected for a distance  of approximately 4 cm, ligated and placed on gentle superior traction.  Bilateral seminal vesicles were dissected to their tips and placed on gentle superior traction.  Dissection proceeded within this plane towards the area of the apex of the prostate.   This exposed the vascular pedicles on each side, which were controlled using a sequential clipping technique at the base to apex orientation.  Final apical dissection was performed in the anterior plane placing the prostate on superior traction and  transecting the membranous urethra coldly.  This completely freed up the large prostatectomy specimen, which was placed into an EndoCatch bag for later retrieval.  Next, digital rectal exam was performed under laparoscopic vision with indicator glove and  no evidence of rectal violation was noted.  Posterior dissection was performed using 3-0 V-Loc suture reapproximating the posterior urethral plate the posterior bladder neck, bringing the structures into tension free apposition.  Next mucosa-to-mucosa  anastomosis was performed using 3-0 double armed V-Loc suture, which resulted in excellent mucosal apposition circumferentially.  A new Foley catheter was placed per urethra, which irrigated quantitatively.  Sponge and needle counts were correct.   Hemostasis appeared excellent.  A closed suction drain was brought through the previous left lateral most port site near the peritoneal cavity.  The previous right 12 mm assistant port site was closed with  fascia using Carter-Thomason suture passer and 0  Vicryl.  Specimen was retrieved by extending the previous camera port site superiorly for a distance of approximately 5 cm, removing the prostatectomy specimen setting aside from pathology.  This was closed with fascia using figure-of-eight PDS x4  followed by reapproximation of Scarpa's with a running Vicryl.  All incision sites were infiltrated with dilute lipolyzed Marcaine and closed at the level skin using subcuticular Monocryl by Dermabond.  The procedure then terminated.  The patient  tolerated the procedure well.  No immediate complications.  The patient was taken to postanesthesia care in stable condition.  Please note, first assistant Bari Mantis was crucial for all robotic portions of the procedure today.  She provided invaluable specimen manipulation, retraction, vascular stapling, lymphatic clipping and general first assistance.  TN/NUANCE  D:03/14/2018 T:03/14/2018 JOB:[id number]

## 2018-03-14 NOTE — H&P (Signed)
Matthew House is an 78 y.o. male.    Chief Complaint: Pre-Op Prostatectomy  HPI:   1 - Large Volume Moderate Risk Prostate Cancer in Very Large Prostate - 8/12 cores up to 70% mix of grade 1 and 2 cancer by BX 01/2018 on eval rising PSA to 4.8. TRUS 151mL, no median lobe. All base cores as well as some bilateral lateral cores positive.   PMH sig for ortho surgery. NO ischemic CV disease / blood thinners. No chest / abd surgery.Marland Kitchen His PCP is Alysia Penna MD  Today "Matthew House" is seen to proceed with curative intent prostatectomy. No interval fevers.    Past Medical History:  Diagnosis Date  . BPH (benign prostatic hyperplasia)   . 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 GERD (gastroesophageal reflux disease)   . Hyperlipidemia   . Rosacea   . S/P tonsillectomy and adenoidectomy     Past Surgical History:  Procedure Laterality Date  . COLONOSCOPY  09/28/2010   per Dr. Olevia Perches, no polyps, repeat in 10 yrs   . INCISE AND DRAIN ABCESS  2002   right 3rd finger  . KNEE CARTILAGE SURGERY Right   . TONSILLECTOMY AND ADENOIDECTOMY  1952  . VASECTOMY  1992    No family history on file. Social History:  reports that he quit smoking about 40 years ago. He has never used smokeless tobacco. He reports that he does not drink alcohol or use drugs.  Allergies:  Allergies  Allergen Reactions  . Shingrix [Zoster Vac Recomb Adjuvanted]     Arm swelling and water blisters under arm    Medications Prior to Admission  Medication Sig Dispense Refill  . aspirin 81 MG tablet Take 81 mg by mouth every evening.     . cetirizine (ZYRTEC) 10 MG tablet Take 10 mg by mouth daily.    . cholecalciferol (VITAMIN D) 25 MCG (1000 UT) tablet Take 2,000-3,000 Units by mouth See admin instructions. 2000 units in the morning, and 3000 units in the evening    . cyanocobalamin (,VITAMIN B-12,) 1000 MCG/ML injection Inject 1,000 mcg into the muscle every 30 (thirty) days.     Marland Kitchen doxycycline  (VIBRAMYCIN) 100 MG capsule Take 1 capsule (100 mg total) by mouth daily. 100 capsule 3  . oxybutynin (DITROPAN) 5 MG tablet Take 1 tablet (5 mg total) by mouth every morning. 90 tablet 3  . ranitidine (ZANTAC) 300 MG tablet Take 300 mg by mouth 2 (two) times daily.    . simvastatin (ZOCOR) 40 MG tablet Take 1 tablet (40 mg total) by mouth at bedtime. 100 tablet 3    No results found for this or any previous visit (from the past 48 hour(s)). No results found.  Review of Systems  Constitutional: Negative for chills and fever.  All other systems reviewed and are negative.   There were no vitals taken for this visit. Physical Exam  Constitutional: He appears well-developed.  HENT:  Head: Normocephalic.  Eyes: Pupils are equal, round, and reactive to light.  Neck: Normal range of motion.  Cardiovascular: Normal rate.  Respiratory: Effort normal.  GI: Soft.  Genitourinary:    Genitourinary Comments: NO CVAT   Musculoskeletal: Normal range of motion.  Neurological: He is alert.  Skin: Skin is warm.  Psychiatric: He has a normal mood and affect.     Assessment/Plan  Proceed as planned with prostatectomy. Risks, benefits, alternatives, expected peri-op course discussed previously and reiterated today.  Alexis Frock, MD 03/14/2018, 6:48 AM

## 2018-03-14 NOTE — Discharge Instructions (Signed)
1. Activity:  You are encouraged to ambulate frequently (about every hour during waking hours) to help prevent blood clots from forming in your legs or lungs.  However, you should not engage in any heavy lifting (> 10-15 lbs), strenuous activity, or straining. 2. Diet: You should continue a clear liquid diet until passing gas from below.  Once this occurs, you may advance your diet to a soft diet that would be easy to digest (i.e soups, scrambled eggs, mashed potatoes, etc.) for 24 hours just as you would if getting over a bad stomach flu.  If tolerating this diet well for 24 hours, you may then begin eating regular food.  It will be normal to have some amount of bloating, nausea, and abdominal discomfort intermittently. 3. Prescriptions:  You will be provided a prescription for pain medication to take as needed.  If your pain is not severe enough to require the prescription pain medication, you may take Tylenol instead.  You should also take an over the counter stool softener (Colace 100 mg twice daily) to avoid straining with bowel movements as the pain medication may constipate you. Finally, you will also be provided a prescription for an antibiotic to begin the day prior to your return visit in the office for catheter removal. 4. Catheter care: You will be taught how to take care of the catheter by the nursing staff prior to discharge from the hospital.  You may use both a leg bag and the larger bedside bag but it is recommended to at least use the bigger bedside bag at nighttime as the leg bag is small and will fill up overnight and also does not drain as well when lying flat. You may periodically feel a strong urge to void with the catheter in place.  This is a bladder spasm and most often can occur when having a bowel movement or when you are moving around. It is typically self-limited and usually will stop after a few minutes.  You may use some Vaseline or Neosporin around the tip of the catheter to  reduce friction at the tip of the penis. 5. Incisions: You may remove your dressing bandages the 2nd day after surgery.  You most likely will have a few small staples in each of the incisions and once the bandages are removed, the incisions may stay open to air.  You may start showering (not soaking or bathing in water) 48 hours after surgery and the incisions simply need to be patted dry after the shower.  No additional care is needed. 6. What to call us about: You should call the office [phone]) if you develop fever > 101, persistent vomiting, or the catheter stops draining. Also, feel free to call with any other questions you may have and remember the handout that was provided to you as a reference preoperatively which answers many of the common questions that arise after surgery.  7. You may resume aspirin, advil, aleve, vitamins, and supplements 7 days after surgery.  Discuss when to restart Ditropan with Dr. Tresa Moore at your follow up appt.

## 2018-03-14 NOTE — Transfer of Care (Signed)
Immediate Anesthesia Transfer of Care Note  Patient: Matthew House  Procedure(s) Performed: XI ROBOTIC ASSISTED LAPAROSCOPIC RADICAL PROSTATECTOMY (N/A ) LYMPHADENECTOMY (Bilateral )  Patient Location: PACU  Anesthesia Type:General  Level of Consciousness: awake and patient cooperative  Airway & Oxygen Therapy: Patient Spontanous Breathing and Patient connected to face mask  Post-op Assessment: Report given to RN and Post -op Vital signs reviewed and stable  Post vital signs: Reviewed and stable  Last Vitals:  Vitals Value Taken Time  BP    Temp    Pulse 90 03/14/2018 11:53 AM  Resp 20 03/14/2018 11:53 AM  SpO2 98 % 03/14/2018 11:53 AM  Vitals shown include unvalidated device data.  Last Pain:  Vitals:   03/14/18 0651  TempSrc: Oral         Complications: No apparent anesthesia complications

## 2018-03-14 NOTE — Anesthesia Postprocedure Evaluation (Signed)
Anesthesia Post Note  Patient: Matthew House  Procedure(s) Performed: XI ROBOTIC ASSISTED LAPAROSCOPIC RADICAL PROSTATECTOMY (N/A ) LYMPHADENECTOMY (Bilateral )     Patient location during evaluation: PACU Anesthesia Type: General Level of consciousness: awake and alert Pain management: pain level controlled Vital Signs Assessment: post-procedure vital signs reviewed and stable Respiratory status: spontaneous breathing, nonlabored ventilation and respiratory function stable Cardiovascular status: blood pressure returned to baseline and stable Postop Assessment: no apparent nausea or vomiting Anesthetic complications: no    Last Vitals:  Vitals:   03/14/18 1315 03/14/18 1400  BP: (!) 152/67 (!) 156/64  Pulse: 92 92  Resp: 16 16  Temp: (!) 36.4 C 36.6 C  SpO2: 96% 98%    Last Pain:  Vitals:   03/14/18 1557  TempSrc:   PainSc: [place]

## 2018-03-15 ENCOUNTER — Encounter (HOSPITAL_COMMUNITY): Payer: Self-pay | Admitting: Urology

## 2018-03-15 LAB — BASIC METABOLIC PANEL
ANION GAP: 6 (ref 5–15)
BUN: 15 mg/dL (ref 8–23)
CHLORIDE: 104 mmol/L (ref 98–111)
CO2: 26 mmol/L (ref 22–32)
Calcium: 8.4 mg/dL — ABNORMAL LOW (ref 8.9–10.3)
Creatinine, Ser: 0.86 mg/dL (ref 0.61–1.24)
GFR calc Af Amer: >60 mL/min (ref >60)
GFR calc non Af Amer: >60 mL/min (ref >60)
Glucose, Bld: 137 mg/dL — ABNORMAL HIGH (ref 70–99)
Potassium: 3.9 mmol/L (ref 3.5–5.1)
Sodium: 136 mmol/L (ref 135–145)

## 2018-03-15 LAB — CBC
HCT: 30 % — ABNORMAL LOW (ref 39.0–52.0)
Hemoglobin: 9.7 g/dL — ABNORMAL LOW (ref 13.0–17.0)
MCH: 29 pg (ref 26.0–34.0)
MCHC: 32.3 g/dL (ref 30.0–36.0)
MCV: 89.6 fL (ref 80.0–100.0)
Platelets: 152 10*3/uL (ref 150–400)
RBC: 3.35 MIL/uL — ABNORMAL LOW (ref 4.22–5.81)
RDW: 13.7 % (ref 11.5–15.5)
WBC: 12.1 10*3/uL — ABNORMAL HIGH (ref 4.0–10.5)
nRBC: 0 % (ref 0.0–0.2)

## 2018-03-15 MED ORDER — ACETAMINOPHEN 500 MG PO TABS
1000.0000 mg | ORAL_TABLET | Freq: Four times a day (QID) | ORAL | Status: AC
  Administered 2018-03-15 (×3): 1000 mg via ORAL
  Filled 2018-03-15 (×3): qty 2

## 2018-03-15 NOTE — Progress Notes (Signed)
Patient ID: Matthew House, male   DOB: 26-Dec-1940, 78 y.o.   MRN: [id number]  Pt with poorly draining catheter this evening.  Despite multiple irrigation attempts by nursing staff, still poor output.  I irrigated his catheter manually with multiple clots removed and about 500 cc of grossly bloody urine removed.  Catheter was then able to irrigate quantitatively.  Urine still red but draining well and no further clots.  Will increased IVF hydration and continue to monitor.  Nursing staff instructed on catheter irrigation technique as needed overnight.

## 2018-03-15 NOTE — Progress Notes (Signed)
Patient c/o fullness in his upper abdomen as well as penile and scrotal swelling. Foley did not appear to be draining properly. RN attempted to irrigate several times with blood and clots as return. Urology on call paged. Salley Slaughter returned page and was able to contact Dr. Alinda Money. MD returned call and instructed to RN to bladder scan. Bladder scan at that time showed 34mL. Dr. Alinda Money instructed RN to clamp catheter and put in 100-112mL of NS, unclamp, and observe for draining. Foley did not drain. Dr. Alinda Money informed RN to gather 20g hematuria coude catheter and irrigation supplies. Awaiting MD arrival to the bedside. Will medicate patient for abdominal pain per [facility] and continue to monitor.

## 2018-03-15 NOTE — Progress Notes (Signed)
Urology Progress Note   1 Day Post-Op  Subjective: 1) CaP w/ ~100cc prostate. S/p RALP on 03/15/18 - overall doing well. Issues with hematuria requiring hand irrigation overnight. Urine fairly red/viscous this AM. UOP ~2.4L. JP 375cc.   Pain well controlled. Tolerated clear liquid diet. No flatus. Ambulating well.  Hgb 9.7 from 11.7   Objective: Vital signs in last 24 hours: Temp:  [97.5 F (36.4 C)-99.1 F (37.3 C)] 97.7 F (36.5 C) (03/12 0445) Pulse Rate:  [79-102] 90 (03/12 0445) Resp:  [12-20] 18 (03/12 0445) BP: (118-164)/(57-75) 118/57 (03/12 0445) SpO2:  [96 %-99 %] 98 % (03/12 0445) Weight:  [586 kg] 108 kg (03/11 1623)  Intake/Output from previous day: 03/11 0701 - 03/12 0700 In: 3963.3 [P.O.:120; I.V.:2443.3; IV Piggyback:1000] Out: 2975 [Urine:2400; Drains:375; Blood:200] Intake/Output this shift: No intake/output data recorded.  Physical Exam:  General: Alert and oriented CV: RRR Lungs: Clear Abdomen: Soft, appropriately tender. Port incisions clean dry and intact. JP serosanguinous GU: Foley in place draining medium red/dark red urine. Hand irrigation this AM did not evacuate much clot  Ext: NT, No erythema  Lab Results: Recent Labs    03/14/18 1156 03/15/18 0545  HGB 11.7* 9.7*  HCT 35.1* 30.0*   BMET Recent Labs    03/15/18 0545  NA 136  K 3.9  CL 104  CO2 26  GLUCOSE 137*  BUN 15  CREATININE 0.86  CALCIUM 8.4*     Studies/Results: No results found.  Assessment/Plan:  78 y.o. male s/p RALP on 03/15/18.  Overall doing well post-op.   - continue mIVF this AM to dilute hematuria,  - continue clears until ROBF - Ambulate TID - repeat lab work tomorrow AM - Plan to observe in house today. Anticipate discharge home toorrow    Dispo: floor   LOS: 0 days   Fredricka Bonine 03/15/2018, 7:14 AM

## 2018-03-16 DIAGNOSIS — Z7982 Long term (current) use of aspirin: Secondary | ICD-10-CM | POA: Diagnosis not present

## 2018-03-16 DIAGNOSIS — R319 Hematuria, unspecified: Secondary | ICD-10-CM | POA: Diagnosis not present

## 2018-03-16 DIAGNOSIS — Z79899 Other long term (current) drug therapy: Secondary | ICD-10-CM | POA: Diagnosis not present

## 2018-03-16 DIAGNOSIS — K219 Gastro-esophageal reflux disease without esophagitis: Secondary | ICD-10-CM | POA: Diagnosis present

## 2018-03-16 DIAGNOSIS — Z87891 Personal history of nicotine dependence: Secondary | ICD-10-CM | POA: Diagnosis not present

## 2018-03-16 DIAGNOSIS — C61 Malignant neoplasm of prostate: Secondary | ICD-10-CM | POA: Diagnosis present

## 2018-03-16 DIAGNOSIS — N400 Enlarged prostate without lower urinary tract symptoms: Secondary | ICD-10-CM | POA: Diagnosis present

## 2018-03-16 DIAGNOSIS — E785 Hyperlipidemia, unspecified: Secondary | ICD-10-CM | POA: Diagnosis present

## 2018-03-16 DIAGNOSIS — Z888 Allergy status to other drugs, medicaments and biological substances status: Secondary | ICD-10-CM | POA: Diagnosis not present

## 2018-03-16 DIAGNOSIS — Z85828 Personal history of other malignant neoplasm of skin: Secondary | ICD-10-CM | POA: Diagnosis not present

## 2018-03-16 LAB — BASIC METABOLIC PANEL
Anion gap: 9 (ref 5–15)
BUN: 10 mg/dL (ref 8–23)
CO2: 26 mmol/L (ref 22–32)
Calcium: 8.8 mg/dL — ABNORMAL LOW (ref 8.9–10.3)
Chloride: 102 mmol/L (ref 98–111)
Creatinine, Ser: 0.81 mg/dL (ref 0.61–1.24)
GFR calc Af Amer: >60 mL/min (ref >60)
GFR calc non Af Amer: >60 mL/min (ref >60)
Glucose, Bld: 112 mg/dL — ABNORMAL HIGH (ref 70–99)
POTASSIUM: 4 mmol/L (ref 3.5–5.1)
Sodium: 137 mmol/L (ref 135–145)

## 2018-03-16 LAB — HEMOGLOBIN AND HEMATOCRIT, BLOOD
HCT: 30.1 % — ABNORMAL LOW (ref 39.0–52.0)
Hemoglobin: 9.6 g/dL — ABNORMAL LOW (ref 13.0–17.0)

## 2018-03-16 MED ORDER — SENNOSIDES-DOCUSATE SODIUM 8.6-50 MG PO TABS: 1.0000 | ORAL_TABLET | Freq: Every day | ORAL | 0 refills | Status: DC

## 2018-03-16 NOTE — Progress Notes (Signed)
Pt discharge instructions reviewed with patient and wife. Catheter care and incision care was reviewed before discharge. Pt discharged via wheelchair . No questions or concerns at this time.

## 2018-03-16 NOTE — Progress Notes (Addendum)
Urology Progress Note   2 Days Post-Op  Subjective: 1) CaP w/ ~100cc prostate. S/p RALP on 03/15/18 - overall doing well. Lab work pending.   Pain well controlled. Tolerated clear liquid diet. No flatus. Ambulating well.  Hgb 9.7 from 11.7. HR ~102 this AM, other vitals wnl. UOP 3.7L. JP w/ 315 cc output.   2) Hematuria - catheter traction yesterday and kept on mIVF.   Objective: Vital signs in last 24 hours: Temp:  [98.2 F (36.8 C)-98.6 F (37 C)] 98.6 F (37 C) (03/13 0554) Pulse Rate:  [93-102] 102 (03/13 0554) Resp:  [16-18] 18 (03/13 0554) BP: (123-160)/(64-68) 146/68 (03/13 0554) SpO2:  [94 %-99 %] 94 % (03/13 0554)  Intake/Output from previous day: 03/12 0701 - 03/13 0700 In: 1560 [P.O.:1560] Out: 3343 [Urine:3750; Drains:315] Intake/Output this shift: No intake/output data recorded.  Physical Exam:  General: Alert and oriented CV: RRR Lungs: Clear Abdomen: Soft, appropriately tender. Port incisions clean dry and intact. JP serosanguinous GU: Foley in place draining medium red/dark red urine. Hand irrigation this AM did not evacuate much clot  Ext: NT, No erythema  Lab Results: Recent Labs    03/14/18 1156 03/15/18 0545  HGB 11.7* 9.7*  HCT 35.1* 30.0*   BMET Recent Labs    03/15/18 0545  NA 136  K 3.9  CL 104  CO2 26  GLUCOSE 137*  BUN 15  CREATININE 0.86  CALCIUM 8.4*     Studies/Results: No results found.  Assessment/Plan:  78 y.o. male s/p RALP on 03/15/18.  Overall doing well post-op.   - [place] - Regular diet - Ambulate TID - Will monitor through the morning, including observing urine quality - Anticipate discharge to home this afternoon if doing well    Dispo: floor   LOS: 0 days   Fredricka Bonine 03/16/2018, 7:09 AM

## 2018-03-16 NOTE — Discharge Summary (Signed)
[facility] Discharge Summary  Admit date: 03/14/2018  Discharge date and time: 03/16/18   Discharge to: Home  Discharge Service: Urology  Discharge Attending Physician:  Dr. Tresa Moore  Discharge  Diagnoses: Prostate cancer  Secondary Diagnosis: Active Problems:   Prostate cancer [facility])   OR Procedures: Procedure(s): XI ROBOTIC ASSISTED LAPAROSCOPIC RADICAL PROSTATECTOMY LYMPHADENECTOMY 03/14/2018   Ancillary Procedures: None   Discharge Day Services: The patient was seen and examined by the Urology team both in the morning and immediately prior to discharge.  Vital signs and laboratory values were stable and within normal limits.  The physical exam was benign and unchanged and all surgical wounds were examined.  Discharge instructions were explained and all questions answered.  Subjective  No acute events overnight. Pain Controlled. No fever or chills.  Objective No data found. Total I/O In: 600 [P.O.:600] Out: 710 [Urine:650; Drains:60]  General Appearance:        No acute distress Lungs:                       Normal work of breathing on room air Heart:                                Regular rate and rhythm Abdomen:                         Soft, non-tender, non-distended. JP removed. Port incisions clean dry and intact Extremities:                      Warm and well perfused GU:      Urine quality improved significantly. Now light yellow urine at discharge   Hospital Course:  The patient underwent a RALP on 03/14/2018 with Dr. Tresa Moore.  The patient tolerated the procedure well, was extubated in the OR, and afterwards was taken to the PACU for routine post-surgical care. When stable the patient was transferred to the floor.   The patient did well postoperatively.  He did have some issues with hematuria POD1 which responded well to balloon inflation and gentle catheter traction. The patient's diet was slowly advanced and at the time of discharge was tolerating a regular diet.   The patient was discharged home 2 Days Post-Op, at which point was tolerating a regular solid diet, have adequate pain control with P.O. pain medication, and could ambulate without difficulty. His urine improved to light yellow by time of discharge.The patient will follow up with [place] for post op check.   Condition at Discharge: Improved  Discharge Medications:  Allergies as of 03/16/2018      Reactions   Shingrix [zoster Vac Recomb Adjuvanted]    Arm swelling and water blisters under arm      Medication List    STOP taking these medications   aspirin 81 MG tablet   cholecalciferol 25 MCG (1000 UT) tablet Commonly known as:  VITAMIN D   cyanocobalamin 1000 MCG/ML injection Commonly known as:  (VITAMIN B-12)   oxybutynin 5 MG tablet Commonly known as:  DITROPAN     TAKE these medications   cetirizine 10 MG tablet Commonly known as:  ZYRTEC Take 10 mg by mouth daily.   doxycycline 100 MG capsule Commonly known as:  VIBRAMYCIN Take 1 capsule (100 mg total) by mouth daily.   HYDROcodone-acetaminophen 5-325 MG tablet Commonly known as:  Norco Take 1-2  tablets by mouth every 6 (six) hours as needed.   ranitidine 300 MG tablet Commonly known as:  ZANTAC Take 300 mg by mouth 2 (two) times daily.   senna-docusate 8.6-50 MG tablet Commonly known as:  Senokot-S Take 1 tablet by mouth daily.   simvastatin 40 MG tablet Commonly known as:  ZOCOR Take 1 tablet (40 mg total) by mouth at bedtime.   sulfamethoxazole-trimethoprim 800-160 MG tablet Commonly known as:  BACTRIM DS,SEPTRA DS Take 1 tablet by mouth 2 (two) times daily. Start the day prior to foley removal appointment

## 2018-04-02 ENCOUNTER — Other Ambulatory Visit: Payer: Self-pay | Admitting: Family Medicine

## 2018-04-02 ENCOUNTER — Telehealth: Payer: Self-pay | Admitting: *Deleted

## 2018-04-02 DIAGNOSIS — E559 Vitamin D deficiency, unspecified: Secondary | ICD-10-CM
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
## 2018-04-04 ENCOUNTER — Other Ambulatory Visit (INDEPENDENT_AMBULATORY_CARE_PROVIDER_SITE_OTHER): Payer: PPO

## 2018-04-04 ENCOUNTER — Ambulatory Visit: Payer: PPO

## 2018-04-04 ENCOUNTER — Other Ambulatory Visit: Payer: Self-pay

## 2018-04-04 DIAGNOSIS — E559 Vitamin D deficiency, unspecified: Secondary | ICD-10-CM

## 2018-04-04 DIAGNOSIS — E538 Deficiency of other specified B group vitamins: Secondary | ICD-10-CM | POA: Diagnosis not present

## 2018-04-04 LAB — VITAMIN D 25 HYDROXY (VIT D DEFICIENCY, FRACTURES): VITD: 20.15 ng/mL — ABNORMAL LOW (ref 30.00–100.00)

## 2018-04-04 MED ORDER — CYANOCOBALAMIN 1000 MCG/ML IJ SOLN
1000.0000 ug | Freq: Once | INTRAMUSCULAR | Status: AC
  Administered 2018-04-04: 1000 ug via INTRAMUSCULAR

## 2018-04-07 ENCOUNTER — Telehealth: Payer: Self-pay | Admitting: Family Medicine

## 2018-04-07 NOTE — Telephone Encounter (Signed)
Patient called patient engagement cneter concerned about the recall about zantac.  Please contact patient [phone]

## 2018-04-09 NOTE — Telephone Encounter (Signed)
Dr. Sarajane Jews please advise on changing the zantac.  thanks

## 2018-04-09 NOTE — Telephone Encounter (Signed)
The other medication in the same family as Zantac is Pepci, which is available. Call in Pepcid 40 mg bid, #180 with 3 rf

## 2018-04-10 MED ORDER — FAMOTIDINE 40 MG PO TABS: 40.0000 mg | ORAL_TABLET | Freq: Two times a day (BID) | ORAL | 3 refills | Status: DC

## 2018-04-10 NOTE — Telephone Encounter (Signed)
Called and spoke with pt and he is aware of new med sent to the pharmacy.     Pt stated that he has been taking the Vitamin D 2000 Units  -----3 tablets in the morning and 2 tablets in the evening.  Should he stay on this dose or decrease to the 2000 Units daily as told by his last labs done.

## 2018-04-10 NOTE — Telephone Encounter (Signed)
Actually he should stay on 5000 units a day

## 2018-04-10 NOTE — Telephone Encounter (Signed)
Per Dr. Sarajane Jews---  He should not do more than 6000 units per day.    I have called the pt and left a VM for him to make him aware of this and his med list has been updated

## 2018-04-23 ENCOUNTER — Other Ambulatory Visit: Payer: Self-pay

## 2018-04-23 ENCOUNTER — Encounter: Payer: Self-pay | Admitting: Sports Medicine

## 2018-04-23 ENCOUNTER — Ambulatory Visit (INDEPENDENT_AMBULATORY_CARE_PROVIDER_SITE_OTHER): Payer: PPO | Admitting: Sports Medicine

## 2018-04-23 ENCOUNTER — Ambulatory Visit: Payer: Self-pay

## 2018-04-23 VITALS — BP 142/82 | HR 80 | Temp 97.7°F | Ht 71.0 in

## 2018-04-23 DIAGNOSIS — M1711 Unilateral primary osteoarthritis, right knee: Secondary | ICD-10-CM | POA: Diagnosis not present

## 2018-04-23 NOTE — Procedures (Signed)
PROCEDURE NOTE:  Ultrasound Guided: Injection: Right knee Images were obtained and interpreted by myself, Teresa Coombs, DO  Images have been saved and stored to PACS system. Images obtained on: GE S7 Ultrasound machine    ULTRASOUND FINDINGS:  Mild to moderate degenerative changes of PF region No significant effusion  DESCRIPTION OF PROCEDURE:  The patient's clinical condition is marked by substantial pain and/or significant functional disability. Other conservative therapy has not provided relief, is contraindicated, or not appropriate. There is a reasonable likelihood that injection will significantly improve the patient's pain and/or functional impairment.   After discussing the risks, benefits and expected outcomes of the injection and all questions were reviewed and answered, the patient wished to undergo the above named procedure.  Verbal consent was obtained.  The ultrasound was used to identify the target structure and adjacent neurovascular structures. The skin was then prepped in sterile fashion and the target structure was injected under direct visualization using sterile technique as below:  Single injection performed as below: PREP: Alcohol and Ethel Chloride APPROACH:superiolateral, single injection, 21g 2 in. INJECTATE: 4cc MonoVisc ASPIRATE: None DRESSING: Band-Aid  Post procedural instructions including recommending icing and warning signs for infection were reviewed.    This procedure was well tolerated and there were no complications.   IMPRESSION: Succesful Ultrasound Guided: Injection

## 2018-04-23 NOTE — Patient Instructions (Signed)
You had an injection today.  Things to be aware of after injection are listed below: . You may experience no significant improvement or even a slight worsening in your symptoms during the first 24 to 48 hours.  After that we expect your symptoms to improve gradually over the next 2 weeks for the medicine to have its maximal effect.  You should continue to have improvement out to 6 weeks after your injection. . Dr. Kerryn Tennant recommends icing the site of the injection for 20 minutes  1-2 times the day of your injection . You may shower but no swimming, tub bath or Jacuzzi for 24 hours. . If your bandage falls off this does not need to be replaced.  It is appropriate to remove the bandage after 4 hours. . You may resume light activities as tolerated unless otherwise directed per Dr. Dusan Lipford during your visit  POSSIBLE STEROID SIDE EFFECTS:  Side effects from injectable steroids tend to be less than when taken orally however you may experience some of the symptoms listed below.  If experienced these should only last for a short period of time. Change in menstrual flow  Edema (swelling)  Increased appetite Skin flushing (redness)  Skin rash/acne  Thrush (oral) Yeast vaginitis    Increased sweating  Depression Increased blood glucose levels Cramping and leg/calf  Euphoria (feeling happy)  POSSIBLE PROCEDURE SIDE EFFECTS: The side effects of the injection are usually fairly minimal however if you may experience some of the following side effects that are usually self-limited and will is off on their own.  If you are concerned please feel free to call the office with questions:  Increased numbness or tingling  Nausea or vomiting  Swelling or bruising at the injection site   Please call our office if if you experience any of the following symptoms over the next week as these can be signs of infection:   Fever greater than 100.5F  Significant swelling at the injection site  Significant redness or drainage  from the injection site  If after 2 weeks you are continuing to have worsening symptoms please call our office to discuss what the next appropriate actions should be including the potential for a return office visit or other diagnostic testing.    
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
## 2018-05-09 ENCOUNTER — Ambulatory Visit (INDEPENDENT_AMBULATORY_CARE_PROVIDER_SITE_OTHER): Payer: PPO

## 2018-05-09 ENCOUNTER — Other Ambulatory Visit: Payer: Self-pay

## 2018-05-09 DIAGNOSIS — E538 Deficiency of other specified B group vitamins: Secondary | ICD-10-CM | POA: Diagnosis not present

## 2018-05-09 MED ORDER — CYANOCOBALAMIN 1000 MCG/ML IJ SOLN
1000.0000 ug | Freq: Once | INTRAMUSCULAR | Status: AC
  Administered 2018-05-09: 1000 ug via INTRAMUSCULAR

## 2018-05-09 NOTE — Progress Notes (Signed)
Per orders of Dr. Sarajane Jews, injection of B12 given by Rebecca Eaton. Patient tolerated injection well.   Tommi Rumps, please review in Dr. Barbie Banner abscess.

## 2018-05-15 ENCOUNTER — Telehealth: Payer: Self-pay | Admitting: Family Medicine

## 2018-05-15 NOTE — Telephone Encounter (Signed)
Called and spoke with pt and he is aware of change in med per Dr. Sarajane Jews.  He will update [place] in 2-3 weeks.

## 2018-05-15 NOTE — Telephone Encounter (Signed)
Spoke with Matthew House and he stated that the increase of the zantac at 150 mg  BID this helped with his stomach and diarrhea.  He wanted to know which one Dr. Sarajane Jews felt would help him better.    He also wanted to see if he could take immodium maybe 2 times per week to see if that would help him control the diarrhea as well.

## 2018-05-15 NOTE — Telephone Encounter (Signed)
The other choices he would have would the proton pump inhibitors like Prilosec, Nexium, etc. He can either try these OTC first or I can send in a prescription  for one if he wishes

## 2018-05-15 NOTE — Telephone Encounter (Signed)
The patient came in wanting to know if there is another alternative for Zantac besides  famotidine (PEPCID) 40 MG tablet  This medication is not working as good as the Zantac did  Please advise

## 2018-05-15 NOTE — Telephone Encounter (Signed)
Dr. Fry please advise. Thanks  

## 2018-05-15 NOTE — Telephone Encounter (Signed)
Tell him to try Prilosec 20 mg (OTC) twice a day for a week or two and see how this works. He may add Imodium as needed.

## 2018-05-15 NOTE — Addendum Note (Signed)
Addended by: Elie Confer on: 05/15/2018 02:59 PM   Modules accepted: Orders

## 2018-06-01 NOTE — Progress Notes (Signed)
Patient came in for a B12 injection

## 2018-06-08 ENCOUNTER — Ambulatory Visit (INDEPENDENT_AMBULATORY_CARE_PROVIDER_SITE_OTHER): Payer: PPO

## 2018-06-08 ENCOUNTER — Other Ambulatory Visit: Payer: Self-pay

## 2018-06-08 DIAGNOSIS — E538 Deficiency of other specified B group vitamins: Secondary | ICD-10-CM

## 2018-06-08 MED ORDER — CYANOCOBALAMIN 1000 MCG/ML IJ SOLN
1000.0000 ug | Freq: Once | INTRAMUSCULAR | Status: AC
  Administered 2018-06-08: 1000 ug via INTRAMUSCULAR

## 2018-06-08 NOTE — Progress Notes (Signed)
Per orders of Dr. Alysia Penna, injection of Cyanocobalamin 1000 mcg / mL given by Wyvonne Lenz. Patient tolerated injection well.

## 2018-06-13 ENCOUNTER — Ambulatory Visit: Payer: PPO | Admitting: Family Medicine

## 2018-06-20 DIAGNOSIS — C61 Malignant neoplasm of prostate: Secondary | ICD-10-CM | POA: Diagnosis not present

## 2018-06-26 DIAGNOSIS — C61 Malignant neoplasm of prostate: Secondary | ICD-10-CM | POA: Diagnosis not present

## 2018-06-26 DIAGNOSIS — N393 Stress incontinence (female) (male): Secondary | ICD-10-CM | POA: Diagnosis not present

## 2018-06-26 DIAGNOSIS — N5231 Erectile dysfunction following radical prostatectomy: Secondary | ICD-10-CM | POA: Diagnosis not present

## 2018-07-13 ENCOUNTER — Other Ambulatory Visit: Payer: Self-pay

## 2018-07-13 ENCOUNTER — Ambulatory Visit (INDEPENDENT_AMBULATORY_CARE_PROVIDER_SITE_OTHER): Payer: PPO | Admitting: *Deleted

## 2018-07-13 DIAGNOSIS — E538 Deficiency of other specified B group vitamins: Secondary | ICD-10-CM | POA: Diagnosis not present

## 2018-07-13 MED ORDER — CYANOCOBALAMIN 1000 MCG/ML IJ SOLN
1000.0000 ug | Freq: Once | INTRAMUSCULAR | Status: AC
  Administered 2018-07-13: 1000 ug via INTRAMUSCULAR

## 2018-07-24 NOTE — Progress Notes (Signed)
Patient came in for a B12 injection.
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
## 2018-08-13 ENCOUNTER — Other Ambulatory Visit: Payer: Self-pay

## 2018-08-13 ENCOUNTER — Ambulatory Visit (INDEPENDENT_AMBULATORY_CARE_PROVIDER_SITE_OTHER): Payer: PPO

## 2018-08-13 DIAGNOSIS — E538 Deficiency of other specified B group vitamins: Secondary | ICD-10-CM | POA: Diagnosis not present

## 2018-08-13 MED ORDER — CYANOCOBALAMIN 1000 MCG/ML IJ SOLN
1000.0000 ug | Freq: Once | INTRAMUSCULAR | Status: AC
  Administered 2018-08-13: 10:00:00 1000 ug via INTRAMUSCULAR

## 2018-08-13 NOTE — Progress Notes (Signed)
Per orders of Dr. Fry, injection of B12 given by Nayellie Sanseverino. Patient tolerated injection well.  

## 2018-08-24 ENCOUNTER — Telehealth (INDEPENDENT_AMBULATORY_CARE_PROVIDER_SITE_OTHER): Payer: PPO | Admitting: Family Medicine

## 2018-08-24 ENCOUNTER — Encounter: Payer: Self-pay | Admitting: Family Medicine

## 2018-08-24 ENCOUNTER — Other Ambulatory Visit: Payer: Self-pay

## 2018-08-24 DIAGNOSIS — J3089 Other allergic rhinitis: Secondary | ICD-10-CM

## 2018-08-24 DIAGNOSIS — R42 Dizziness and giddiness: Secondary | ICD-10-CM | POA: Diagnosis not present

## 2018-08-24 NOTE — Progress Notes (Signed)
Virtual Visit via Telephone Note  I connected with the patient on 08/24/18 at  8:00 AM EDT by telephone and verified that I am speaking with the correct person using two identifiers. We attempted to connect virtually but we had technical difficulties with the audio and video.     I discussed the limitations, risks, security and privacy concerns of performing an evaluation and management service by telephone and the availability of in person appointments. I also discussed with the patient that there may be a patient responsible charge related to this service. The patient expressed understanding and agreed to proceed.  Location patient: home Location provider: work or home office Participants present for the call: patient, provider Patient did not have a visit in the prior 7 days to address this/these issue(s).   History of Present Illness: Here for 10 days of stuffy head and ears, along with some dizziness. No fever or headache or ST or SOB or cough. He takes Zyrtec daily. 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    Observations/Objective: Patient sounds cheerful and well on the phone. I do not appreciate any SOB. Speech and thought processing are grossly intact. Patient reported vitals:  Assessment and Plan: He has some labyrinthine dysfunction secondary to sinus congestion. He will add Flonase daily, and he can use Mucinex D bid as needed.  Alysia Penna, MD   Follow Up Instructions:     [phone] 5-10 [phone] 11-20 [phone] 21-30 I did not refer this patient for an OV in the next 24 hours for this/these issue(s).  I discussed the assessment and treatment plan with the patient. The patient was provided an opportunity to ask questions and all were answered. The patient agreed with the plan and demonstrated an understanding of the instructions.   The patient was advised to call back or seek an in-person  evaluation if the symptoms worsen or if the condition fails to improve as anticipated.  I provided 15 minutes of non-face-to-face time during this encounter.   Alysia Penna, MD

## 2018-09-14 ENCOUNTER — Ambulatory Visit (INDEPENDENT_AMBULATORY_CARE_PROVIDER_SITE_OTHER): Payer: PPO

## 2018-09-14 ENCOUNTER — Other Ambulatory Visit: Payer: Self-pay

## 2018-09-14 DIAGNOSIS — E538 Deficiency of other specified B group vitamins: Secondary | ICD-10-CM

## 2018-09-14 MED ORDER — CYANOCOBALAMIN 1000 MCG/ML IJ SOLN
1000.0000 ug | Freq: Once | INTRAMUSCULAR | Status: AC
  Administered 2018-09-14: 1000 ug via INTRAMUSCULAR

## 2018-09-14 NOTE — Progress Notes (Signed)
Per orders of Dr. Fry, injection of B12 given by Aavya Shafer. Patient tolerated injection well.  

## 2018-09-19 ENCOUNTER — Ambulatory Visit: Payer: PPO

## 2018-09-21 DIAGNOSIS — L249 Irritant contact dermatitis, unspecified cause: Secondary | ICD-10-CM | POA: Diagnosis not present

## 2018-09-21 DIAGNOSIS — D229 Melanocytic nevi, unspecified: Secondary | ICD-10-CM | POA: Diagnosis not present

## 2018-09-21 DIAGNOSIS — L821 Other seborrheic keratosis: Secondary | ICD-10-CM | POA: Diagnosis not present

## 2018-09-21 DIAGNOSIS — D1801 Hemangioma of skin and subcutaneous tissue: Secondary | ICD-10-CM | POA: Diagnosis not present

## 2018-10-12 ENCOUNTER — Ambulatory Visit (INDEPENDENT_AMBULATORY_CARE_PROVIDER_SITE_OTHER): Payer: PPO | Admitting: *Deleted

## 2018-10-12 ENCOUNTER — Other Ambulatory Visit: Payer: Self-pay

## 2018-10-12 ENCOUNTER — Telehealth: Payer: Self-pay | Admitting: Family Medicine

## 2018-10-12 DIAGNOSIS — E538 Deficiency of other specified B group vitamins: Secondary | ICD-10-CM

## 2018-10-12 MED ORDER — CYANOCOBALAMIN 1000 MCG/ML IJ SOLN
1000.0000 ug | Freq: Once | INTRAMUSCULAR | Status: AC
  Administered 2018-10-12: 1000 ug via INTRAMUSCULAR

## 2018-10-12 NOTE — Telephone Encounter (Signed)
I would continue the shots, but make them every 2 weeks instead of 4

## 2018-10-12 NOTE — Telephone Encounter (Signed)
The patient was in the office today for a B12 injection. He stated that by the 4 th week he starts slowing down when the B12 wears off. He was wanting to know if he can take B12  Supplements to prevent him from crashing before his next injection.  Please advise

## 2018-10-12 NOTE — Progress Notes (Signed)
Patient in office today for B-12. B-12 injection administered with no reaction

## 2018-10-12 NOTE — Telephone Encounter (Signed)
Dr. Fry please advise 

## 2018-10-15 DIAGNOSIS — H2513 Age-related nuclear cataract, bilateral: Secondary | ICD-10-CM | POA: Diagnosis not present

## 2018-10-15 NOTE — Telephone Encounter (Signed)
Patient notified per Dr. Sarajane Jews to continue injections but do every 2 weeks instead of every 4 weeks. Patient verbalized understanding. He reports he will call his insurance company to ensure they cover him getting B-12 injections every 2 weeks. If they cover he will schedule every 2 weeks if not he will call back for Dr. Sarajane Jews to advise

## 2018-10-17 ENCOUNTER — Other Ambulatory Visit: Payer: Self-pay | Admitting: Family Medicine

## 2018-10-17 DIAGNOSIS — L719 Rosacea, unspecified: Secondary | ICD-10-CM

## 2018-10-17 DIAGNOSIS — N5089 Other specified disorders of the male genital organs: Secondary | ICD-10-CM

## 2018-10-18 NOTE — Telephone Encounter (Signed)
Called pt 2x no answer. Pt needs to schedule an appointment.

## 2018-10-19 ENCOUNTER — Ambulatory Visit (INDEPENDENT_AMBULATORY_CARE_PROVIDER_SITE_OTHER): Payer: PPO | Admitting: Family Medicine

## 2018-10-19 ENCOUNTER — Encounter: Payer: Self-pay | Admitting: Family Medicine

## 2018-10-19 ENCOUNTER — Other Ambulatory Visit: Payer: Self-pay

## 2018-10-19 VITALS — BP 132/60 | HR 72 | Temp 98.2°F | Ht 70.75 in | Wt 233.0 lb

## 2018-10-19 DIAGNOSIS — E7800 Pure hypercholesterolemia, unspecified: Secondary | ICD-10-CM | POA: Diagnosis not present

## 2018-10-19 DIAGNOSIS — E538 Deficiency of other specified B group vitamins: Secondary | ICD-10-CM

## 2018-10-19 DIAGNOSIS — N5089 Other specified disorders of the male genital organs: Secondary | ICD-10-CM | POA: Diagnosis not present

## 2018-10-19 DIAGNOSIS — Z0000 Encounter for general adult medical examination without abnormal findings: Secondary | ICD-10-CM | POA: Diagnosis not present

## 2018-10-19 DIAGNOSIS — Z23 Encounter for immunization: Secondary | ICD-10-CM

## 2018-10-19 DIAGNOSIS — L719 Rosacea, unspecified: Secondary | ICD-10-CM

## 2018-10-19 LAB — BASIC METABOLIC PANEL
BUN: 13 mg/dL (ref 6–23)
CO2: 26 mEq/L (ref 19–32)
Calcium: 9.6 mg/dL (ref 8.4–10.5)
Chloride: 103 mEq/L (ref 96–112)
Creatinine, Ser: 0.75 mg/dL (ref 0.40–1.50)
GFR: 100.9 mL/min (ref >60.00)
Glucose, Bld: 101 mg/dL — ABNORMAL HIGH (ref 70–99)
Potassium: 4.1 mEq/L (ref 3.5–5.1)
Sodium: 138 mEq/L (ref 135–145)

## 2018-10-19 LAB — CBC WITH DIFFERENTIAL/PLATELET
Basophils Absolute: 0 10*3/uL (ref 0.0–0.1)
Basophils Relative: 0.7 % (ref 0.0–3.0)
Eosinophils Absolute: 0.2 10*3/uL (ref 0.0–0.7)
Eosinophils Relative: 2.9 % (ref 0.0–5.0)
HCT: 36.6 % — ABNORMAL LOW (ref 39.0–52.0)
Hemoglobin: 12.5 g/dL — ABNORMAL LOW (ref 13.0–17.0)
Lymphocytes Relative: 26.5 % (ref 12.0–46.0)
Lymphs Abs: 1.7 10*3/uL (ref 0.7–4.0)
MCHC: 34.1 g/dL (ref 30.0–36.0)
MCV: 85 fl (ref 78.0–100.0)
Monocytes Absolute: 0.6 10*3/uL (ref 0.1–1.0)
Monocytes Relative: 9.5 % (ref 3.0–12.0)
Neutro Abs: 3.9 10*3/uL (ref 1.4–7.7)
Neutrophils Relative %: 60.4 % (ref 43.0–77.0)
Platelets: 159 10*3/uL (ref 150.0–400.0)
RBC: 4.31 Mil/uL (ref 4.22–5.81)
RDW: 13.8 % (ref 11.5–15.5)
WBC: 6.4 10*3/uL (ref 4.0–10.5)

## 2018-10-19 LAB — HEPATIC FUNCTION PANEL
ALT: 24 U/L (ref 0–53)
AST: 17 U/L (ref 0–37)
Albumin: 4.8 g/dL (ref 3.5–5.2)
Alkaline Phosphatase: 74 U/L (ref 39–117)
Bilirubin, Direct: 0.2 mg/dL (ref 0.0–0.3)
Total Bilirubin: 0.9 mg/dL (ref 0.2–1.2)
Total Protein: 7.3 g/dL (ref 6.0–8.3)

## 2018-10-19 LAB — LIPID PANEL
Cholesterol: 103 mg/dL (ref 0–200)
HDL: 25.7 mg/dL — ABNORMAL LOW (ref >39.00)
NonHDL: 77.42
Total CHOL/HDL Ratio: 4
Triglycerides: 248 mg/dL — ABNORMAL HIGH (ref 0.0–149.0)
VLDL: 49.6 mg/dL — ABNORMAL HIGH (ref 0.0–40.0)

## 2018-10-19 LAB — POC URINALSYSI DIPSTICK (AUTOMATED)
Glucose, UA: NEGATIVE
Leukocytes, UA: NEGATIVE
Protein, UA: NEGATIVE
Spec Grav, UA: 1.025 (ref 1.010–1.025)
Urobilinogen, UA: 0.2 E.U./dL
pH, UA: 6 (ref 5.0–8.0)

## 2018-10-19 LAB — VITAMIN B12: Vitamin B-12: 493 pg/mL (ref 211–911)

## 2018-10-19 LAB — LDL CHOLESTEROL, DIRECT: Direct LDL: 43 mg/dL

## 2018-10-19 LAB — TSH: TSH: 2.31 u[IU]/mL (ref 0.35–4.50)

## 2018-10-19 MED ORDER — DOXYCYCLINE HYCLATE 100 MG PO CAPS: 100.0000 mg | ORAL_CAPSULE | Freq: Every day | ORAL | 3 refills | Status: DC

## 2018-10-19 MED ORDER — SIMVASTATIN 40 MG PO TABS: 40.0000 mg | ORAL_TABLET | Freq: Every day | ORAL | 3 refills | Status: DC

## 2018-10-19 MED ORDER — CEPHALEXIN 500 MG PO CAPS: 500.0000 mg | ORAL_CAPSULE | Freq: Three times a day (TID) | ORAL | 0 refills | Status: AC

## 2018-10-19 MED ORDER — SILDENAFIL CITRATE 50 MG PO TABS: 50.0000 mg | ORAL_TABLET | Freq: Every day | ORAL | 11 refills | Status: DC | PRN

## 2018-10-19 NOTE — Progress Notes (Signed)
   Subjective:    Patient ID: Matthew House, male    DOB: [birth year], 78 y.o.   MRN: [id number]  HPI  Duplicate   Review of Systems     Objective:   Physical Exam        Assessment & Plan:  Duplicate

## 2018-10-19 NOTE — Patient Instructions (Signed)
Health Maintenance Due  Topic Date Due  . INFLUENZA VACCINE  08/04/2018    Depression screen [facility] 2/9 10/17/2017 10/11/2016 10/06/2015  Decreased Interest 0 0 0  Down, Depressed, Hopeless 0 0 0  PHQ - 2 Score 0 0 0  Altered sleeping - 0 -  Tired, decreased energy - 0 -  Change in appetite - 0 -  Feeling bad or failure about yourself  - 0 -  Trouble concentrating - 0 -  Moving slowly or fidgety/restless - 0 -  Suicidal thoughts - 0 -  PHQ-9 Score - 0 -  Difficult doing work/chores - Not difficult at all -

## 2018-10-19 NOTE — Progress Notes (Signed)
Subjective:    Patient ID: Matthew House, male    DOB: 02/04/1940, 78 y.o.   MRN: [id number]  HPI Here for a well exam. He has a few issues to discuss. His prostatectomy went very well and he follows up with Urology regularly. He has having some ED issues now and he wants to try something for that. Also for 2 weeks he has had some mild soreness in the right side of the throat and he has a lymph node in the right neck area that swells periodically. No fever or cough.    Review of Systems  Constitutional: Negative.   HENT: Positive for sore throat.   Eyes: Negative.   Respiratory: Negative.   Cardiovascular: Negative.   Gastrointestinal: Negative.   Genitourinary: Negative.   Musculoskeletal: Negative.   Skin: Negative.   Neurological: Negative.   Hematological: Positive for adenopathy.  Psychiatric/Behavioral: Negative.        Objective:   Physical Exam Constitutional:      General: He is not in acute distress.    Appearance: He is well-developed. He is not diaphoretic.  HENT:     Head: Normocephalic and atraumatic.     Right Ear: External ear normal.     Left Ear: External ear normal.     Nose: Nose normal.     Mouth/Throat:     Pharynx: No oropharyngeal exudate.     Comments: There is mild erythema in the right posterior OP  Eyes:     General: No scleral icterus.       Right eye: No discharge.        Left eye: No discharge.     Conjunctiva/sclera: Conjunctivae normal.     Pupils: Pupils are equal, round, and reactive to light.  Neck:     Musculoskeletal: Neck supple.     Thyroid: No thyromegaly.     Vascular: No JVD.     Trachea: No tracheal deviation.     Comments: Single small enlarged non-tender right submandibular node  Cardiovascular:     Rate and Rhythm: Normal rate and regular rhythm.     Heart sounds: Normal heart sounds. No murmur. No friction rub. No gallop.   Pulmonary:     Effort: Pulmonary effort is normal. No respiratory distress.     Breath  sounds: Normal breath sounds. No wheezing or rales.  Chest:     Chest wall: No tenderness.  Abdominal:     General: Bowel sounds are normal. There is no distension.     Palpations: Abdomen is soft. There is no mass.     Tenderness: There is no abdominal tenderness. There is no guarding or rebound.  Genitourinary:    Penis: No tenderness.   Musculoskeletal: Normal range of motion.        General: No tenderness.  Skin:    General: Skin is warm and dry.     Coloration: Skin is not pale.     Findings: No erythema or rash.  Neurological:     Mental Status: He is alert and oriented to person, place, and time.     Cranial Nerves: No cranial nerve deficit.     Motor: No abnormal muscle tone.     Coordination: Coordination normal.     Deep Tendon Reflexes: Reflexes are normal and symmetric. Reflexes normal.  Psychiatric:        Behavior: Behavior normal.        Thought Content: Thought content normal.  Judgment: Judgment normal.           Assessment & Plan:  Well exam. We discussed diet and exercise. Get fasting labs. He has a slight pharyngitis so we will treat with Keflex. Try some Viagra for the ED.  Alysia Penna, MD

## 2018-10-26 ENCOUNTER — Other Ambulatory Visit: Payer: Self-pay

## 2018-10-26 ENCOUNTER — Ambulatory Visit (INDEPENDENT_AMBULATORY_CARE_PROVIDER_SITE_OTHER): Payer: PPO | Admitting: *Deleted

## 2018-10-26 DIAGNOSIS — E538 Deficiency of other specified B group vitamins: Secondary | ICD-10-CM | POA: Diagnosis not present

## 2018-10-26 MED ORDER — CYANOCOBALAMIN 1000 MCG/ML IJ SOLN: 1000.0000 ug | Freq: Once | INTRAMUSCULAR | 0 refills | Status: AC

## 2018-10-26 MED ORDER — CYANOCOBALAMIN 1000 MCG/ML IJ SOLN
1000.0000 ug | Freq: Once | INTRAMUSCULAR | Status: AC
  Administered 2018-10-26: 1000 ug via INTRAMUSCULAR

## 2018-10-26 NOTE — Progress Notes (Signed)
Per orders of Dr. Burchette, injection of Cyanocobalamin 1000mcg given by Taneshia Lorence A. Patient tolerated injection well.  

## 2018-11-02 ENCOUNTER — Telehealth: Payer: Self-pay | Admitting: Family Medicine

## 2018-11-02 NOTE — Telephone Encounter (Signed)
Spoke to pt and he stated that he just told the pharmacy to d/c medication. Pt stated that he will get his B12 shot at next OV.

## 2018-11-02 NOTE — Telephone Encounter (Signed)
Called pt no answer. There is nothing in pt chart to verify this.

## 2018-11-02 NOTE — Telephone Encounter (Signed)
The patient called in wanting to know why Burchette would be calling in an Rx to the pharmacy for B12 injections. He doesn't do his own injections he comes into the office. He just wanted to know why and should he go to pick up the medication that was called in for him or was it suppose to be a different Rosana Berger?

## 2018-11-12 ENCOUNTER — Other Ambulatory Visit: Payer: Self-pay

## 2018-11-12 ENCOUNTER — Encounter: Payer: Self-pay | Admitting: Family Medicine

## 2018-11-12 ENCOUNTER — Ambulatory Visit (INDEPENDENT_AMBULATORY_CARE_PROVIDER_SITE_OTHER): Payer: PPO

## 2018-11-12 ENCOUNTER — Telehealth (INDEPENDENT_AMBULATORY_CARE_PROVIDER_SITE_OTHER): Payer: PPO | Admitting: Family Medicine

## 2018-11-12 ENCOUNTER — Telehealth: Payer: Self-pay | Admitting: Family Medicine

## 2018-11-12 DIAGNOSIS — E538 Deficiency of other specified B group vitamins: Secondary | ICD-10-CM | POA: Diagnosis not present

## 2018-11-12 DIAGNOSIS — J029 Acute pharyngitis, unspecified: Secondary | ICD-10-CM | POA: Diagnosis not present

## 2018-11-12 MED ORDER — AMOXICILLIN-POT CLAVULANATE 875-125 MG PO TABS: 1.0000 | ORAL_TABLET | Freq: Two times a day (BID) | ORAL | 0 refills | Status: DC

## 2018-11-12 MED ORDER — CYANOCOBALAMIN 1000 MCG/ML IJ SOLN
1000.0000 ug | Freq: Once | INTRAMUSCULAR | Status: AC
  Administered 2018-11-12: 1000 ug via INTRAMUSCULAR

## 2018-11-12 NOTE — Telephone Encounter (Signed)
We do not show that in his chart, but just to be careful please cancel the Augmentin. Call in Levaquin 500 mg daily for 10 days

## 2018-11-12 NOTE — Patient Instructions (Signed)
There are no preventive care reminders to display for this patient.  Depression screen [facility] 2/9 10/17/2017 10/11/2016 10/06/2015  Decreased Interest 0 0 0  Down, Depressed, Hopeless 0 0 0  PHQ - 2 Score 0 0 0  Altered sleeping - 0 -  Tired, decreased energy - 0 -  Change in appetite - 0 -  Feeling bad or failure about yourself  - 0 -  Trouble concentrating - 0 -  Moving slowly or fidgety/restless - 0 -  Suicidal thoughts - 0 -  PHQ-9 Score - 0 -  Difficult doing work/chores - Not difficult at all -

## 2018-11-12 NOTE — Telephone Encounter (Signed)
Please advise   I found this in the chart looks like it was deleted  Should we send something else in?   Status: Deleted Type: Allergy  Severity: High Updated by: Belva Agee, CPhT  Reactions: Not Documented  Comment: unspecified  Deletion information  Reason: Entry miscategorized as an allergy

## 2018-11-12 NOTE — Telephone Encounter (Signed)
amoxicillin-clavulanate (AUGMENTIN) 875-125 MG tablet  This script was just sent to [facility] and they are calling stating pt has an allergic reaction to penicillin. Please resend different to   [place], [place] [phone] (Phone) [phone] (Fax)

## 2018-11-12 NOTE — Progress Notes (Signed)
Virtual Visit via Telephone Note  I connected with the patient on 11/12/18 at  3:15 PM EST by telephone and verified that I am speaking with the correct person using two identifiers. We attempted to connect virtually but we had technical difficulties with the audio and video.     I discussed the limitations, risks, security and privacy concerns of performing an evaluation and management service by telephone and the availability of in person appointments. I also discussed with the patient that there may be a patient responsible charge related to this service. The patient expressed understanding and agreed to proceed.  Location patient: home Location provider: work or home office Participants present for the call: patient, provider Patient did not have a visit in the prior 7 days to address this/these issue(s).   History of Present Illness: Here for recurrent symptoms. We saw him on 10-19-18 when he had some PND, a right sided ST. And a reactive right AC node. He took a course of Keflex and all this resolved. He felt fine until 4 days ago when he began to have some sweats and the PND and ST returned. No cough or SOB or headache. No body aches or NVD. He is taking Mucinex D and Tylenol.    Observations/Objective: Patient sounds cheerful and well on the phone. I do not appreciate any SOB. Speech and thought processing are grossly intact. Patient reported vitals:  Assessment and Plan: Recurrent ST, possibly due to a sinusitis. Treat with Augmentin.  Alysia Penna, MD   Follow Up Instructions:     [phone] 5-10 [phone] 11-20 [id number] 21-30 I did not refer this patient for an OV in the next 24 hours for this/these issue(s).  I discussed the assessment and treatment plan with the patient. The patient was provided an opportunity to ask questions and all were answered. The patient agreed with the plan and demonstrated an understanding of the instructions.   The patient was advised to call back or  seek an in-person evaluation if the symptoms worsen or if the condition fails to improve as anticipated.  I provided 16 minutes of non-face-to-face time during this encounter.   Alysia Penna, MD

## 2018-11-12 NOTE — Progress Notes (Signed)
Per orders of Dr. Sarajane Jews, injection of B12 given by Franco Collet. Patient tolerated injection well.

## 2018-11-14 NOTE — Telephone Encounter (Signed)
Spoke to pharmacy and they advised that they spoke to someone who stated that the allergy was not a true allergy. I called the pt to check on his status. Pt stated he is fine and stated he doesn't know who added this to his allergy list at the pharmacy. He stated that he was allergic to penicillin 50 years ago and think he has been taking it over the years. Pt did state that he is having bouts of diarrhea but he is okay. Routing to Dr. Sarajane Jews for advise

## 2018-11-15 NOTE — Telephone Encounter (Signed)
Noted! Called pt no answer.

## 2018-11-15 NOTE — Telephone Encounter (Signed)
Message routed to PCP.

## 2018-11-15 NOTE — Telephone Encounter (Signed)
Tell him to go ahead and take the Augmentin as we had planned

## 2018-11-16 NOTE — Telephone Encounter (Signed)
Pt stated that his shoulders and neck was stiff. Pt stated that he was sleeping and his heart started racing he experienced some dizziness and skin felt "prickly" and was flushed. Pt stated that he feels a little better and has gone to work since then. Pt stated that his sore throat seems to be cleared.

## 2018-11-16 NOTE — Telephone Encounter (Signed)
It sounds like his blood sugar was too low. I'm glad he feels better. Tell him to finish out the Augmentin and to take it on a full stomach.

## 2018-11-16 NOTE — Telephone Encounter (Signed)
See message below °

## 2018-11-16 NOTE — Telephone Encounter (Signed)
Noted  

## 2018-11-16 NOTE — Telephone Encounter (Signed)
Pt stated after he took his medication today he thinks he may have had an allergic reaction. He had shaking and a headache. Stated after 5 hours or so he had a large glass of Sweet tea and that seemed to help and he was better than he was. Called to speak with Tillie Rung. No answer on FC line. Requesting callback.

## 2018-11-16 NOTE — Telephone Encounter (Signed)
Called to give pt update. Pt stated that she is doing good and his throat is feeling pt stated he is " slowly coming back". Pt stated he woke up with a headache pt stated that now he is having more of an with his sinuses now. Pt stated that overall he is okay considering his age.  Pt stated he is having a lot of fatigue and wants to know if this normal. Pt then declined me asking Dr.Fry and stated that he is still working and going out he will be okay.

## 2018-11-16 NOTE — Telephone Encounter (Signed)
Pt notified of update and was advised to call back if sx persist or worsens.

## 2018-11-23 ENCOUNTER — Other Ambulatory Visit: Payer: Self-pay

## 2018-11-26 ENCOUNTER — Ambulatory Visit (INDEPENDENT_AMBULATORY_CARE_PROVIDER_SITE_OTHER): Payer: PPO | Admitting: *Deleted

## 2018-11-26 ENCOUNTER — Other Ambulatory Visit: Payer: Self-pay

## 2018-11-26 DIAGNOSIS — E538 Deficiency of other specified B group vitamins: Secondary | ICD-10-CM | POA: Diagnosis not present

## 2018-11-26 MED ORDER — CYANOCOBALAMIN 1000 MCG/ML IJ SOLN
1000.0000 ug | Freq: Once | INTRAMUSCULAR | Status: AC
  Administered 2018-11-26: 1000 ug via INTRAMUSCULAR

## 2018-11-26 NOTE — Progress Notes (Signed)
Patient here for B-12 injection.  B-12 injection administered with no reactions.

## 2018-12-07 ENCOUNTER — Other Ambulatory Visit: Payer: Self-pay

## 2018-12-10 ENCOUNTER — Other Ambulatory Visit: Payer: Self-pay

## 2018-12-10 ENCOUNTER — Ambulatory Visit (INDEPENDENT_AMBULATORY_CARE_PROVIDER_SITE_OTHER): Payer: PPO

## 2018-12-10 DIAGNOSIS — E538 Deficiency of other specified B group vitamins: Secondary | ICD-10-CM | POA: Diagnosis not present

## 2018-12-10 MED ORDER — CYANOCOBALAMIN 1000 MCG/ML IJ SOLN
1000.0000 ug | Freq: Once | INTRAMUSCULAR | Status: AC
  Administered 2018-12-10: 1000 ug via INTRAMUSCULAR

## 2018-12-10 NOTE — Progress Notes (Signed)
Per orders of Dr. Fry, injection of B12 given by Mallery Harshman. Patient tolerated injection well.  

## 2018-12-17 DIAGNOSIS — C61 Malignant neoplasm of prostate: Secondary | ICD-10-CM | POA: Diagnosis not present

## 2018-12-21 ENCOUNTER — Other Ambulatory Visit: Payer: Self-pay | Admitting: Family Medicine

## 2018-12-21 NOTE — Telephone Encounter (Signed)
Please advise. Should the patient continue this medication? 

## 2018-12-24 ENCOUNTER — Ambulatory Visit: Payer: PPO

## 2018-12-24 DIAGNOSIS — C61 Malignant neoplasm of prostate: Secondary | ICD-10-CM | POA: Diagnosis not present

## 2018-12-24 DIAGNOSIS — R3915 Urgency of urination: Secondary | ICD-10-CM | POA: Diagnosis not present

## 2018-12-24 DIAGNOSIS — N393 Stress incontinence (female) (male): Secondary | ICD-10-CM | POA: Diagnosis not present

## 2018-12-24 DIAGNOSIS — N5231 Erectile dysfunction following radical prostatectomy: Secondary | ICD-10-CM | POA: Diagnosis not present

## 2018-12-25 ENCOUNTER — Other Ambulatory Visit: Payer: Self-pay

## 2018-12-26 ENCOUNTER — Ambulatory Visit: Payer: PPO | Admitting: *Deleted

## 2018-12-26 MED ORDER — CYANOCOBALAMIN 1000 MCG/ML IJ SOLN: 1000.0000 ug | Freq: Once | INTRAMUSCULAR | Status: DC

## 2018-12-26 NOTE — Progress Notes (Deleted)
Appointment canceled. After speaking with Dr. Sarajane Jews, patient is due to have B 12 injections monthly.

## 2019-01-09 ENCOUNTER — Ambulatory Visit (INDEPENDENT_AMBULATORY_CARE_PROVIDER_SITE_OTHER): Payer: PPO

## 2019-01-09 ENCOUNTER — Other Ambulatory Visit: Payer: Self-pay

## 2019-01-09 DIAGNOSIS — E538 Deficiency of other specified B group vitamins: Secondary | ICD-10-CM | POA: Diagnosis not present

## 2019-01-09 MED ORDER — CYANOCOBALAMIN 1000 MCG/ML IJ SOLN
1000.0000 ug | Freq: Once | INTRAMUSCULAR | Status: AC
  Administered 2019-01-09: 1000 ug via INTRAMUSCULAR

## 2019-01-09 NOTE — Progress Notes (Signed)
Per orders of Dr. Fry, injection of B12 given by Jahmire Ruffins. Patient tolerated injection well.  

## 2019-01-11 DIAGNOSIS — M1711 Unilateral primary osteoarthritis, right knee: Secondary | ICD-10-CM | POA: Diagnosis not present

## 2019-01-16 NOTE — Progress Notes (Signed)
Can you please help with closing this encounter, patient was taking off nurse schedule after speaking with pcp and confirming that patient did not need shot on this day. Thanks

## 2019-01-23 ENCOUNTER — Ambulatory Visit: Payer: PPO

## 2019-02-11 ENCOUNTER — Ambulatory Visit (INDEPENDENT_AMBULATORY_CARE_PROVIDER_SITE_OTHER): Payer: PPO

## 2019-02-11 ENCOUNTER — Other Ambulatory Visit: Payer: Self-pay

## 2019-02-11 DIAGNOSIS — E538 Deficiency of other specified B group vitamins: Secondary | ICD-10-CM | POA: Diagnosis not present

## 2019-02-11 MED ORDER — CYANOCOBALAMIN 1000 MCG/ML IJ SOLN
1000.0000 ug | Freq: Once | INTRAMUSCULAR | Status: AC
  Administered 2019-02-11: 09:00:00 1000 ug via INTRAMUSCULAR

## 2019-02-11 NOTE — Progress Notes (Signed)
Per orders of Dr. Fry, injection of Cyanocobalamin 1,000 mcg/mL given by Aljean Horiuchi N Andree Heeg. Patient tolerated injection well.  

## 2019-03-08 ENCOUNTER — Other Ambulatory Visit: Payer: Self-pay

## 2019-03-11 ENCOUNTER — Other Ambulatory Visit: Payer: Self-pay

## 2019-03-11 ENCOUNTER — Ambulatory Visit (INDEPENDENT_AMBULATORY_CARE_PROVIDER_SITE_OTHER): Payer: PPO | Admitting: *Deleted

## 2019-03-11 DIAGNOSIS — E538 Deficiency of other specified B group vitamins: Secondary | ICD-10-CM | POA: Diagnosis not present

## 2019-03-11 MED ORDER — CYANOCOBALAMIN 1000 MCG/ML IJ SOLN
1000.0000 ug | Freq: Once | INTRAMUSCULAR | Status: AC
  Administered 2019-03-11: 1000 ug via INTRAMUSCULAR

## 2019-03-11 NOTE — Progress Notes (Signed)
Per orders of Dr. Fry, injection of B 12 given by Kirsi Hugh S Kartel Wolbert. Patient tolerated injection well.  

## 2019-03-20 ENCOUNTER — Telehealth: Payer: Self-pay | Admitting: Family Medicine

## 2019-03-20 DIAGNOSIS — G629 Polyneuropathy, unspecified: Secondary | ICD-10-CM

## 2019-03-20 DIAGNOSIS — E785 Hyperlipidemia, unspecified: Secondary | ICD-10-CM

## 2019-03-20 NOTE — Progress Notes (Signed)
  Chronic Care Management   Outreach Note  03/20/2019 Name: Matthew House MRN: [id number] DOB: 10-16-1940  Referred by: [name], MD Reason for referral : No chief complaint on file.   An unsuccessful telephone outreach was attempted today. The patient was referred to the pharmacist for assistance with care management and care coordination.   Follow Up Plan:   Raynicia Dukes [employer] Scheduler

## 2019-03-20 NOTE — Progress Notes (Signed)
  Chronic Care Management   Note  03/20/2019 Name: SHANKAR STULL MRN: [id number] DOB: 1940/03/21  NARVELL TOLLIS is a 79 y.o. year old male who is a primary care patient of [name], MD. I reached out to [name] by phone today in response to a referral sent by Mr. SAMI BLUNK PCP, [name], MD.   Mr. Dershem was given information about Chronic Care Management services today including:  1. CCM service includes personalized support from designated clinical staff supervised by his physician, including individualized plan of care and coordination with other care providers 2. 24/7 contact phone numbers for assistance for urgent and routine care needs. 3. Service will only be billed when office clinical staff spend 20 minutes or more in a month to coordinate care. 4. Only one practitioner may furnish and bill the service in a calendar month. 5. The patient may stop CCM services at any time (effective at the end of the month) by phone call to the office staff.   Patient agreed to services and verbal consent obtained.   Follow up plan:   Raynicia Dukes [employer] Scheduler

## 2019-04-09 NOTE — Telephone Encounter (Signed)
Done

## 2019-04-09 NOTE — Addendum Note (Signed)
Addended by: Alysia Penna A on: 04/09/2019 05:10 PM   Modules accepted: Orders

## 2019-04-10 ENCOUNTER — Other Ambulatory Visit: Payer: Self-pay

## 2019-04-11 ENCOUNTER — Ambulatory Visit: Payer: PPO

## 2019-04-11 ENCOUNTER — Ambulatory Visit (INDEPENDENT_AMBULATORY_CARE_PROVIDER_SITE_OTHER): Payer: PPO | Admitting: *Deleted

## 2019-04-11 DIAGNOSIS — E538 Deficiency of other specified B group vitamins: Secondary | ICD-10-CM

## 2019-04-11 MED ORDER — CYANOCOBALAMIN 1000 MCG/ML IJ SOLN
1000.0000 ug | Freq: Once | INTRAMUSCULAR | Status: AC
  Administered 2019-04-11: 1000 ug via INTRAMUSCULAR

## 2019-04-11 NOTE — Progress Notes (Signed)
Patient in office for B-12 injection. B-12 administered in left deltoid with no reaction.

## 2019-04-15 ENCOUNTER — Telehealth: Payer: Self-pay | Admitting: Family Medicine

## 2019-04-15 NOTE — Telephone Encounter (Signed)
That's fine. Please call in supplies for him to inject at home

## 2019-04-15 NOTE — Telephone Encounter (Signed)
Please advise. The patient would like to do B12 injections at home.

## 2019-04-15 NOTE — Telephone Encounter (Signed)
Pt want a call back to talk about doing his on inj his call back # is [phone].

## 2019-04-16 MED ORDER — CYANOCOBALAMIN 1000 MCG/ML IJ SOLN: INTRAMUSCULAR | 5 refills | Status: DC

## 2019-04-16 NOTE — Telephone Encounter (Signed)
Yes he can try every 3 weeks. Check a level in 6 months

## 2019-04-16 NOTE — Telephone Encounter (Signed)
The shots are monthly. The next one is due 05-11-19

## 2019-04-16 NOTE — Telephone Encounter (Signed)
Patient is aware 

## 2019-04-16 NOTE — Telephone Encounter (Signed)
Rx sent in. Spoke with the patient. He stated that when getting the B12 once a month he feels extremely fatigued. He would like to know if he can get B12 injections 3 weeks instead of every 4 weeks.

## 2019-04-19 ENCOUNTER — Other Ambulatory Visit: Payer: Self-pay | Admitting: Family Medicine

## 2019-05-01 ENCOUNTER — Other Ambulatory Visit: Payer: Self-pay

## 2019-05-02 ENCOUNTER — Ambulatory Visit (INDEPENDENT_AMBULATORY_CARE_PROVIDER_SITE_OTHER): Payer: PPO

## 2019-05-02 DIAGNOSIS — E538 Deficiency of other specified B group vitamins: Secondary | ICD-10-CM | POA: Diagnosis not present

## 2019-05-02 MED ORDER — CYANOCOBALAMIN 1000 MCG/ML IJ SOLN
1000.0000 ug | Freq: Once | INTRAMUSCULAR | Status: AC
  Administered 2019-05-02: 1000 ug via INTRAMUSCULAR

## 2019-05-02 NOTE — Patient Instructions (Signed)
Health Maintenance Due  Topic Date Due  . URINE MICROALBUMIN  Never done  . COVID-19 Vaccine (1) Never done    Depression screen [facility] 2/9 10/17/2017 10/11/2016 10/06/2015  Decreased Interest 0 0 0  Down, Depressed, Hopeless 0 0 0  PHQ - 2 Score 0 0 0  Altered sleeping - 0 -  Tired, decreased energy - 0 -  Change in appetite - 0 -  Feeling bad or failure about yourself  - 0 -  Trouble concentrating - 0 -  Moving slowly or fidgety/restless - 0 -  Suicidal thoughts - 0 -  PHQ-9 Score - 0 -  Difficult doing work/chores - Not difficult at all -

## 2019-05-02 NOTE — Progress Notes (Signed)
Per orders of Barbara , injection of B12 given in left deltoid by Franco Collet. Patient tolerated injection well.

## 2019-05-13 ENCOUNTER — Other Ambulatory Visit: Payer: Self-pay | Admitting: Family Medicine

## 2019-05-13 ENCOUNTER — Ambulatory Visit: Payer: PPO

## 2019-05-16 ENCOUNTER — Telehealth: Payer: Self-pay | Admitting: Family Medicine

## 2019-05-16 DIAGNOSIS — Z209 Contact with and (suspected) exposure to unspecified communicable disease: Secondary | ICD-10-CM

## 2019-05-16 NOTE — Telephone Encounter (Signed)
Pt had Covid last year and has had his vaccines he would like to know how long after having Covid can he have an antibody test? Thanks

## 2019-05-16 NOTE — Telephone Encounter (Signed)
He can have the test any time now. I will put in the order

## 2019-05-16 NOTE — Addendum Note (Signed)
Addended by: Alysia Penna A on: 05/16/2019 12:47 PM   Modules accepted: Orders

## 2019-05-16 NOTE — Telephone Encounter (Signed)
I ordered the test

## 2019-05-17 NOTE — Telephone Encounter (Signed)
Spoke with patient. Appointment has been scheduled. Nothing further needed. 

## 2019-05-22 ENCOUNTER — Other Ambulatory Visit: Payer: Self-pay

## 2019-05-22 ENCOUNTER — Telehealth: Payer: Self-pay | Admitting: Family Medicine

## 2019-05-22 NOTE — Telephone Encounter (Signed)
Noted  

## 2019-05-22 NOTE — Telephone Encounter (Signed)
Pt called back and said, disregard he doesn't need a call back. Thanks

## 2019-05-22 NOTE — Telephone Encounter (Signed)
Pt would like to speak to you regarding his testerone level. Pt has a few concerns. Pt did request to speak to you and didn't want to give me any further information. Thanks

## 2019-05-23 ENCOUNTER — Other Ambulatory Visit: Payer: PPO

## 2019-05-23 ENCOUNTER — Ambulatory Visit: Payer: PPO

## 2019-05-23 DIAGNOSIS — Z209 Contact with and (suspected) exposure to unspecified communicable disease: Secondary | ICD-10-CM

## 2019-05-23 DIAGNOSIS — E538 Deficiency of other specified B group vitamins: Secondary | ICD-10-CM

## 2019-05-23 LAB — SARS-COV-2 IGG: SARS-COV-2 IgG: 0.03

## 2019-05-23 MED ORDER — CYANOCOBALAMIN 1000 MCG/ML IJ SOLN
1000.0000 ug | Freq: Once | INTRAMUSCULAR | Status: AC
  Administered 2019-05-23: 1000 ug via INTRAMUSCULAR

## 2019-06-10 DIAGNOSIS — C61 Malignant neoplasm of prostate: Secondary | ICD-10-CM | POA: Diagnosis not present

## 2019-06-12 ENCOUNTER — Other Ambulatory Visit: Payer: Self-pay

## 2019-06-13 ENCOUNTER — Ambulatory Visit (INDEPENDENT_AMBULATORY_CARE_PROVIDER_SITE_OTHER): Payer: PPO

## 2019-06-13 DIAGNOSIS — E538 Deficiency of other specified B group vitamins: Secondary | ICD-10-CM

## 2019-06-13 MED ORDER — CYANOCOBALAMIN 1000 MCG/ML IJ SOLN
1000.0000 ug | Freq: Once | INTRAMUSCULAR | Status: AC
  Administered 2019-06-13: 1000 ug via INTRAMUSCULAR

## 2019-06-13 NOTE — Patient Instructions (Signed)
Health Maintenance Due  Topic Date Due  . Hepatitis C Screening  Never done  . URINE MICROALBUMIN  Never done  . COVID-19 Vaccine (1) Never done    Depression screen [facility] 2/9 10/17/2017 10/11/2016 10/06/2015  Decreased Interest 0 0 0  Down, Depressed, Hopeless 0 0 0  PHQ - 2 Score 0 0 0  Altered sleeping - 0 -  Tired, decreased energy - 0 -  Change in appetite - 0 -  Feeling bad or failure about yourself  - 0 -  Trouble concentrating - 0 -  Moving slowly or fidgety/restless - 0 -  Suicidal thoughts - 0 -  PHQ-9 Score - 0 -  Difficult doing work/chores - Not difficult at all -

## 2019-06-13 NOTE — Progress Notes (Signed)
Per orders ofDr. Sarajane Jews , injection of B12 given in Right deltoid by Franco Collet. Patient tolerated injection well.

## 2019-06-15 ENCOUNTER — Other Ambulatory Visit: Payer: Self-pay | Admitting: Family Medicine

## 2019-06-17 DIAGNOSIS — N393 Stress incontinence (female) (male): Secondary | ICD-10-CM | POA: Diagnosis not present

## 2019-06-17 DIAGNOSIS — C61 Malignant neoplasm of prostate: Secondary | ICD-10-CM | POA: Diagnosis not present

## 2019-06-17 DIAGNOSIS — N5231 Erectile dysfunction following radical prostatectomy: Secondary | ICD-10-CM | POA: Diagnosis not present

## 2019-06-17 DIAGNOSIS — R3915 Urgency of urination: Secondary | ICD-10-CM | POA: Diagnosis not present

## 2019-06-19 DIAGNOSIS — L738 Other specified follicular disorders: Secondary | ICD-10-CM | POA: Diagnosis not present

## 2019-06-19 DIAGNOSIS — D485 Neoplasm of uncertain behavior of skin: Secondary | ICD-10-CM | POA: Diagnosis not present

## 2019-06-19 DIAGNOSIS — D225 Melanocytic nevi of trunk: Secondary | ICD-10-CM | POA: Diagnosis not present

## 2019-06-19 DIAGNOSIS — Z85828 Personal history of other malignant neoplasm of skin: Secondary | ICD-10-CM | POA: Diagnosis not present

## 2019-06-19 DIAGNOSIS — L821 Other seborrheic keratosis: Secondary | ICD-10-CM | POA: Diagnosis not present

## 2019-06-19 DIAGNOSIS — L905 Scar conditions and fibrosis of skin: Secondary | ICD-10-CM | POA: Diagnosis not present

## 2019-06-19 DIAGNOSIS — D1801 Hemangioma of skin and subcutaneous tissue: Secondary | ICD-10-CM | POA: Diagnosis not present

## 2019-07-04 ENCOUNTER — Ambulatory Visit (INDEPENDENT_AMBULATORY_CARE_PROVIDER_SITE_OTHER): Payer: PPO

## 2019-07-04 ENCOUNTER — Other Ambulatory Visit: Payer: Self-pay

## 2019-07-04 DIAGNOSIS — E538 Deficiency of other specified B group vitamins: Secondary | ICD-10-CM

## 2019-07-04 MED ORDER — CYANOCOBALAMIN 1000 MCG/ML IJ SOLN
1000.0000 ug | Freq: Once | INTRAMUSCULAR | Status: AC
  Administered 2019-07-04: 1000 ug via INTRAMUSCULAR

## 2019-07-04 NOTE — Progress Notes (Signed)
Per orders of Dr.Fry , injection of B12 given in Left deltoid by Franco Collet. Patient tolerated injection well.

## 2019-07-04 NOTE — Patient Instructions (Signed)
Health Maintenance Due  Topic Date Due  . Hepatitis C Screening  Never done  . URINE MICROALBUMIN  Never done  . COVID-19 Vaccine (1) Never done    Depression screen [facility] 2/9 10/17/2017 10/11/2016 10/06/2015  Decreased Interest 0 0 0  Down, Depressed, Hopeless 0 0 0  PHQ - 2 Score 0 0 0  Altered sleeping - 0 -  Tired, decreased energy - 0 -  Change in appetite - 0 -  Feeling bad or failure about yourself  - 0 -  Trouble concentrating - 0 -  Moving slowly or fidgety/restless - 0 -  Suicidal thoughts - 0 -  PHQ-9 Score - 0 -  Difficult doing work/chores - Not difficult at all -

## 2019-07-10 ENCOUNTER — Other Ambulatory Visit: Payer: Self-pay | Admitting: Family Medicine

## 2019-07-12 DIAGNOSIS — M1711 Unilateral primary osteoarthritis, right knee: Secondary | ICD-10-CM | POA: Diagnosis not present

## 2019-07-26 ENCOUNTER — Ambulatory Visit (INDEPENDENT_AMBULATORY_CARE_PROVIDER_SITE_OTHER): Payer: PPO | Admitting: *Deleted

## 2019-07-26 ENCOUNTER — Other Ambulatory Visit: Payer: Self-pay

## 2019-07-26 ENCOUNTER — Telehealth: Payer: Self-pay | Admitting: Family Medicine

## 2019-07-26 DIAGNOSIS — E538 Deficiency of other specified B group vitamins: Secondary | ICD-10-CM | POA: Diagnosis not present

## 2019-07-26 MED ORDER — CYANOCOBALAMIN 1000 MCG/ML IJ SOLN
1000.0000 ug | Freq: Once | INTRAMUSCULAR | Status: AC
  Administered 2019-07-26: 1000 ug via INTRAMUSCULAR

## 2019-07-26 NOTE — Progress Notes (Signed)
Per orders of Dr. Fry, injection of B 12 given by Payton Moder S Cordelle Dahmen. Patient tolerated injection well.  

## 2019-07-26 NOTE — Telephone Encounter (Signed)
Patient wants to add a new medication to his chart.  Celecoxib 100 mg capsule

## 2019-08-07 DIAGNOSIS — T310 Burns involving less than 10% of body surface: Secondary | ICD-10-CM | POA: Diagnosis not present

## 2019-08-07 DIAGNOSIS — M25542 Pain in joints of left hand: Secondary | ICD-10-CM | POA: Diagnosis not present

## 2019-08-07 DIAGNOSIS — M25541 Pain in joints of right hand: Secondary | ICD-10-CM | POA: Diagnosis not present

## 2019-08-12 ENCOUNTER — Telehealth: Payer: Self-pay | Admitting: *Deleted
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
## 2019-08-19 ENCOUNTER — Other Ambulatory Visit: Payer: Self-pay | Admitting: Family Medicine

## 2019-08-21 DIAGNOSIS — M9908 Segmental and somatic dysfunction of rib cage: Secondary | ICD-10-CM | POA: Diagnosis not present

## 2019-08-21 DIAGNOSIS — M9901 Segmental and somatic dysfunction of cervical region: Secondary | ICD-10-CM | POA: Diagnosis not present

## 2019-08-21 DIAGNOSIS — M9902 Segmental and somatic dysfunction of thoracic region: Secondary | ICD-10-CM | POA: Diagnosis not present

## 2019-08-21 DIAGNOSIS — M546 Pain in thoracic spine: Secondary | ICD-10-CM | POA: Diagnosis not present

## 2019-08-26 ENCOUNTER — Other Ambulatory Visit: Payer: Self-pay

## 2019-08-26 ENCOUNTER — Ambulatory Visit (INDEPENDENT_AMBULATORY_CARE_PROVIDER_SITE_OTHER): Payer: PPO | Admitting: *Deleted

## 2019-08-26 DIAGNOSIS — E538 Deficiency of other specified B group vitamins: Secondary | ICD-10-CM | POA: Diagnosis not present

## 2019-08-26 MED ORDER — CYANOCOBALAMIN 1000 MCG/ML IJ SOLN
1000.0000 ug | Freq: Once | INTRAMUSCULAR | Status: AC
  Administered 2019-08-26: 1000 ug via INTRAMUSCULAR

## 2019-08-26 NOTE — Progress Notes (Signed)
Patient in for B-12 injection. Injection administered with no reaction.

## 2019-08-28 DIAGNOSIS — D485 Neoplasm of uncertain behavior of skin: Secondary | ICD-10-CM | POA: Diagnosis not present

## 2019-08-28 DIAGNOSIS — L718 Other rosacea: Secondary | ICD-10-CM | POA: Diagnosis not present

## 2019-08-28 DIAGNOSIS — C44619 Basal cell carcinoma of skin of left upper limb, including shoulder: Secondary | ICD-10-CM | POA: Diagnosis not present

## 2019-09-14 ENCOUNTER — Other Ambulatory Visit: Payer: Self-pay | Admitting: Family Medicine

## 2019-09-26 ENCOUNTER — Ambulatory Visit (INDEPENDENT_AMBULATORY_CARE_PROVIDER_SITE_OTHER): Payer: PPO | Admitting: *Deleted

## 2019-09-26 ENCOUNTER — Other Ambulatory Visit: Payer: Self-pay

## 2019-09-26 DIAGNOSIS — E538 Deficiency of other specified B group vitamins: Secondary | ICD-10-CM | POA: Diagnosis not present

## 2019-09-26 MED ORDER — CYANOCOBALAMIN 1000 MCG/ML IJ SOLN
1000.0000 ug | Freq: Once | INTRAMUSCULAR | Status: AC
  Administered 2019-09-26: 1000 ug via INTRAMUSCULAR

## 2019-09-26 NOTE — Progress Notes (Signed)
Patient in for b-12 injection. Injection administered with no immediate reaction.

## 2019-10-03 ENCOUNTER — Telehealth: Payer: Self-pay

## 2019-10-03 NOTE — Telephone Encounter (Signed)
Patient called to discuss his email to Dr. Sarajane Jews about his CPE appointment on 10/24/19 at [facility]. The patient requested a non office visit due to his work schedule 5 days a week, he wants blood work done and leave. Advised patient Dr. Sarajane Jews says he can do a virtual visit that day and get his labs later, but this will NOT count as his yearly physical. Patient verbalized understanding. Email will be scanned into the chart.

## 2019-10-07 ENCOUNTER — Other Ambulatory Visit: Payer: Self-pay | Admitting: Family Medicine

## 2019-10-07 DIAGNOSIS — L719 Rosacea, unspecified: Secondary | ICD-10-CM

## 2019-10-07 DIAGNOSIS — E7800 Pure hypercholesterolemia, unspecified: Secondary | ICD-10-CM

## 2019-10-07 DIAGNOSIS — N5089 Other specified disorders of the male genital organs: Secondary | ICD-10-CM

## 2019-10-14 ENCOUNTER — Other Ambulatory Visit: Payer: Self-pay | Admitting: Family Medicine

## 2019-10-14 DIAGNOSIS — M1711 Unilateral primary osteoarthritis, right knee: Secondary | ICD-10-CM | POA: Diagnosis not present

## 2019-10-14 DIAGNOSIS — M25541 Pain in joints of right hand: Secondary | ICD-10-CM | POA: Diagnosis not present

## 2019-10-14 DIAGNOSIS — M1811 Unilateral primary osteoarthritis of first carpometacarpal joint, right hand: Secondary | ICD-10-CM | POA: Diagnosis not present

## 2019-10-18 ENCOUNTER — Other Ambulatory Visit: Payer: Self-pay | Admitting: Family Medicine

## 2019-10-23 ENCOUNTER — Ambulatory Visit (INDEPENDENT_AMBULATORY_CARE_PROVIDER_SITE_OTHER): Payer: PPO | Admitting: *Deleted

## 2019-10-23 ENCOUNTER — Other Ambulatory Visit: Payer: Self-pay

## 2019-10-23 DIAGNOSIS — E538 Deficiency of other specified B group vitamins: Secondary | ICD-10-CM | POA: Diagnosis not present

## 2019-10-23 MED ORDER — CYANOCOBALAMIN 1000 MCG/ML IJ SOLN
1000.0000 ug | Freq: Once | INTRAMUSCULAR | Status: AC
  Administered 2019-10-23: 1000 ug via INTRAMUSCULAR

## 2019-10-23 NOTE — Progress Notes (Signed)
Patient in for B-12 injection. Injection administered with no immediate reaction.  

## 2019-10-24 ENCOUNTER — Telehealth: Payer: PPO | Admitting: Family Medicine

## 2019-10-24 ENCOUNTER — Ambulatory Visit (INDEPENDENT_AMBULATORY_CARE_PROVIDER_SITE_OTHER): Payer: PPO | Admitting: Family Medicine

## 2019-10-24 ENCOUNTER — Encounter: Payer: Self-pay | Admitting: Family Medicine

## 2019-10-24 VITALS — BP 140/80 | HR 76 | Temp 98.2°F | Ht 70.75 in | Wt 241.4 lb

## 2019-10-24 DIAGNOSIS — R739 Hyperglycemia, unspecified: Secondary | ICD-10-CM

## 2019-10-24 DIAGNOSIS — G629 Polyneuropathy, unspecified: Secondary | ICD-10-CM | POA: Diagnosis not present

## 2019-10-24 DIAGNOSIS — E785 Hyperlipidemia, unspecified: Secondary | ICD-10-CM | POA: Diagnosis not present

## 2019-10-24 DIAGNOSIS — C61 Malignant neoplasm of prostate: Secondary | ICD-10-CM

## 2019-10-24 DIAGNOSIS — K219 Gastro-esophageal reflux disease without esophagitis: Secondary | ICD-10-CM

## 2019-10-24 DIAGNOSIS — E538 Deficiency of other specified B group vitamins: Secondary | ICD-10-CM | POA: Diagnosis not present

## 2019-10-24 DIAGNOSIS — E7800 Pure hypercholesterolemia, unspecified: Secondary | ICD-10-CM

## 2019-10-24 DIAGNOSIS — E559 Vitamin D deficiency, unspecified: Secondary | ICD-10-CM | POA: Diagnosis not present

## 2019-10-24 DIAGNOSIS — L719 Rosacea, unspecified: Secondary | ICD-10-CM | POA: Diagnosis not present

## 2019-10-24 DIAGNOSIS — N5089 Other specified disorders of the male genital organs: Secondary | ICD-10-CM | POA: Diagnosis not present

## 2019-10-24 MED ORDER — SIMVASTATIN 40 MG PO TABS: 40.0000 mg | ORAL_TABLET | Freq: Every day | ORAL | 3 refills | Status: DC

## 2019-10-24 MED ORDER — DOXYCYCLINE HYCLATE 100 MG PO CAPS: 100.0000 mg | ORAL_CAPSULE | Freq: Every day | ORAL | 3 refills | Status: DC

## 2019-10-24 MED ORDER — VITAMIN D3 125 MCG (5000 UT) PO TABS
1.0000 | ORAL_TABLET | Freq: Every day | ORAL | 3 refills | Status: DC
Start: 2019-10-24 — End: 2021-08-17

## 2019-10-24 MED ORDER — FAMOTIDINE 40 MG PO TABS
ORAL_TABLET | ORAL | 3 refills | Status: DC
Start: 2019-10-24 — End: 2020-10-26

## 2019-10-24 NOTE — Addendum Note (Signed)
Addended by: Mliss Sax on: 10/24/2019 09:29 AM   Modules accepted: Orders

## 2019-10-24 NOTE — Progress Notes (Signed)
   Subjective:    Patient ID: Matthew House, male    DOB: 06/07/1940, 79 y.o.   MRN: [id number]  HPI Here for med refills and labs. He feels fine. His GERD is stable.    Review of Systems  Constitutional: Negative.   Respiratory: Negative.   Cardiovascular: Negative.   Gastrointestinal: Negative.   Genitourinary: Negative.        Objective:   Physical Exam Constitutional:      Appearance: Normal appearance.  Cardiovascular:     Rate and Rhythm: Normal rate and regular rhythm.     Pulses: Normal pulses.     Heart sounds: Normal heart sounds.  Pulmonary:     Effort: Pulmonary effort is normal.     Breath sounds: Normal breath sounds.  Neurological:     Mental Status: He is alert.           Assessment & Plan:  His GERD is stable. Medications were refilled. He will get fasting labs drawn today, including lipids and a PSA. He will return on 11-04-19 for a physical. Alysia Penna, MD

## 2019-10-25 ENCOUNTER — Ambulatory Visit: Payer: PPO

## 2019-10-25 LAB — BASIC METABOLIC PANEL
BUN: 17 mg/dL (ref 7–25)
CO2: 29 mmol/L (ref 20–32)
Calcium: 9.6 mg/dL (ref 8.6–10.3)
Chloride: 104 mmol/L (ref 98–110)
Creat: 0.79 mg/dL (ref 0.70–1.18)
Glucose, Bld: 97 mg/dL (ref 65–99)
Potassium: 4.4 mmol/L (ref 3.5–5.3)
Sodium: 139 mmol/L (ref 135–146)

## 2019-10-25 LAB — LIPID PANEL
Cholesterol: 90 mg/dL (ref <200)
HDL: 25 mg/dL — ABNORMAL LOW (ref >=40)
LDL Cholesterol (Calc): 35 mg/dL (calc)
Non-HDL Cholesterol (Calc): 65 mg/dL (calc) (ref <130)
Total CHOL/HDL Ratio: 3.6 (calc) (ref <5.0)
Triglycerides: 242 mg/dL — ABNORMAL HIGH (ref <150)

## 2019-10-25 LAB — CBC WITH DIFFERENTIAL/PLATELET
Absolute Monocytes: 567 cells/uL (ref 200–950)
Basophils Absolute: 31 cells/uL (ref 0–200)
Basophils Relative: 0.5 %
Eosinophils Absolute: 183 cells/uL (ref 15–500)
Eosinophils Relative: 3 %
HCT: 36.6 % — ABNORMAL LOW (ref 38.5–50.0)
Hemoglobin: 12.2 g/dL — ABNORMAL LOW (ref 13.2–17.1)
Lymphs Abs: 1537 cells/uL (ref 850–3900)
MCH: 29.3 pg (ref 27.0–33.0)
MCHC: 33.3 g/dL (ref 32.0–36.0)
MCV: 88 fL (ref 80.0–100.0)
MPV: 10.1 fL (ref 7.5–12.5)
Monocytes Relative: 9.3 %
Neutro Abs: 3782 cells/uL (ref 1500–7800)
Neutrophils Relative %: 62 %
Platelets: 139 10*3/uL — ABNORMAL LOW (ref 140–400)
RBC: 4.16 10*6/uL — ABNORMAL LOW (ref 4.20–5.80)
RDW: 13.7 % (ref 11.0–15.0)
Total Lymphocyte: 25.2 %
WBC: 6.1 10*3/uL (ref 3.8–10.8)

## 2019-10-25 LAB — HEPATIC FUNCTION PANEL
AG Ratio: 1.9 (calc) (ref 1.0–2.5)
ALT: 35 U/L (ref 9–46)
AST: 27 U/L (ref 10–35)
Albumin: 4.7 g/dL (ref 3.6–5.1)
Alkaline phosphatase (APISO): 70 U/L (ref 35–144)
Bilirubin, Direct: 0.2 mg/dL (ref 0.0–0.2)
Globulin: 2.5 g/dL (calc) (ref 1.9–3.7)
Indirect Bilirubin: 0.9 mg/dL (calc) (ref 0.2–1.2)
Total Bilirubin: 1.1 mg/dL (ref 0.2–1.2)
Total Protein: 7.2 g/dL (ref 6.1–8.1)

## 2019-10-25 LAB — HEMOGLOBIN A1C
Hgb A1c MFr Bld: 5.3 % of total Hgb (ref <5.7)
Mean Plasma Glucose: 105 (calc)
eAG (mmol/L): 5.8 (calc)

## 2019-10-25 LAB — VITAMIN B12: Vitamin B-12: >2000 pg/mL — ABNORMAL HIGH (ref 200–1100)

## 2019-10-25 LAB — PSA: PSA: <0.04 ng/mL (ref <=4.0)

## 2019-10-25 LAB — VITAMIN D 25 HYDROXY (VIT D DEFICIENCY, FRACTURES): Vit D, 25-Hydroxy: 40 ng/mL (ref 30–100)

## 2019-10-25 LAB — TSH: TSH: 2.76 mIU/L (ref 0.40–4.50)

## 2019-10-28 DIAGNOSIS — H2513 Age-related nuclear cataract, bilateral: Secondary | ICD-10-CM | POA: Diagnosis not present

## 2019-11-04 ENCOUNTER — Other Ambulatory Visit: Payer: Self-pay

## 2019-11-04 ENCOUNTER — Encounter: Payer: Self-pay | Admitting: Family Medicine

## 2019-11-04 ENCOUNTER — Ambulatory Visit (INDEPENDENT_AMBULATORY_CARE_PROVIDER_SITE_OTHER): Payer: PPO | Admitting: Family Medicine

## 2019-11-04 VITALS — BP 144/72 | HR 80 | Temp 98.0°F | Ht 70.75 in | Wt 239.0 lb

## 2019-11-04 DIAGNOSIS — Z0000 Encounter for general adult medical examination without abnormal findings: Secondary | ICD-10-CM | POA: Diagnosis not present

## 2019-11-04 MED ORDER — KETOCONAZOLE 2 % EX CREA: 1.0000 "application " | TOPICAL_CREAM | Freq: Two times a day (BID) | CUTANEOUS | 1 refills | Status: DC | PRN

## 2019-11-04 NOTE — Progress Notes (Signed)
Subjective:    Patient ID: Matthew House, male    DOB: 09-Aug-1940, 79 y.o.   MRN: [id number]  HPI Here for a well exam. His only concern is a burning rash in the right groin area for a few months. OTC powders and creams do not help. He will see Dr. Tresa Moore for a urologic follow up next month. He had fasting labs last week, and we reviewed these results today.    Review of Systems  Constitutional: Negative.   HENT: Negative.   Eyes: Negative.   Respiratory: Negative.   Cardiovascular: Negative.   Gastrointestinal: Negative.   Genitourinary: Negative.   Musculoskeletal: Negative.   Skin: Positive for rash.  Neurological: Negative.   Psychiatric/Behavioral: Negative.        Objective:   Physical Exam Constitutional:      General: He is not in acute distress.    Appearance: He is well-developed. He is not diaphoretic.  HENT:     Head: Normocephalic and atraumatic.     Right Ear: External ear normal.     Left Ear: External ear normal.     Nose: Nose normal.     Mouth/Throat:     Pharynx: No oropharyngeal exudate.  Eyes:     General: No scleral icterus.       Right eye: No discharge.        Left eye: No discharge.     Conjunctiva/sclera: Conjunctivae normal.     Pupils: Pupils are equal, round, and reactive to light.  Neck:     Thyroid: No thyromegaly.     Vascular: No JVD.     Trachea: No tracheal deviation.  Cardiovascular:     Rate and Rhythm: Normal rate and regular rhythm.     Heart sounds: Normal heart sounds. No murmur heard.  No friction rub. No gallop.   Pulmonary:     Effort: Pulmonary effort is normal. No respiratory distress.     Breath sounds: Normal breath sounds. No wheezing or rales.  Chest:     Chest wall: No tenderness.  Abdominal:     General: Bowel sounds are normal. There is no distension.     Palpations: Abdomen is soft. There is no mass.     Tenderness: There is no abdominal tenderness. There is no guarding or rebound.  Genitourinary:     Penis: No tenderness.   Musculoskeletal:        General: No tenderness. Normal range of motion.     Cervical back: Neck supple.  Lymphadenopathy:     Cervical: No cervical adenopathy.  Skin:    General: Skin is warm and dry.     Coloration: Skin is not pale.     Comments: The right groin has a macular erythematous rash   Neurological:     Mental Status: He is alert and oriented to person, place, and time.     Cranial Nerves: No cranial nerve deficit.     Motor: No abnormal muscle tone.     Coordination: Coordination normal.     Deep Tendon Reflexes: Reflexes are normal and symmetric. Reflexes normal.  Psychiatric:        Behavior: Behavior normal.        Thought Content: Thought content normal.        Judgment: Judgment normal.           Assessment & Plan:  Well exam. We discussed diet and exercise. Treat the tinea cruris with Ketoconazole cream.  Alysia Penna, MD

## 2019-11-14 ENCOUNTER — Other Ambulatory Visit: Payer: Self-pay

## 2019-11-14 ENCOUNTER — Ambulatory Visit (INDEPENDENT_AMBULATORY_CARE_PROVIDER_SITE_OTHER): Payer: PPO | Admitting: *Deleted

## 2019-11-14 DIAGNOSIS — E538 Deficiency of other specified B group vitamins: Secondary | ICD-10-CM | POA: Diagnosis not present

## 2019-11-14 MED ORDER — CYANOCOBALAMIN 1000 MCG/ML IJ SOLN
1000.0000 ug | Freq: Once | INTRAMUSCULAR | Status: AC
  Administered 2019-11-14: 1000 ug via INTRAMUSCULAR

## 2019-11-14 NOTE — Progress Notes (Addendum)
Pt capt me today to receive Vt b 12 injection. Pt was given injection in Left Deltoid successfully w/o any concerns.    I approve this injection. Alysia Penna, MD

## 2019-12-05 ENCOUNTER — Ambulatory Visit: Payer: PPO

## 2019-12-05 DIAGNOSIS — C61 Malignant neoplasm of prostate: Secondary | ICD-10-CM | POA: Diagnosis not present

## 2019-12-05 LAB — PSA: PSA: 0.015

## 2019-12-09 ENCOUNTER — Ambulatory Visit (INDEPENDENT_AMBULATORY_CARE_PROVIDER_SITE_OTHER): Payer: PPO

## 2019-12-09 ENCOUNTER — Other Ambulatory Visit: Payer: Self-pay

## 2019-12-09 DIAGNOSIS — E538 Deficiency of other specified B group vitamins: Secondary | ICD-10-CM | POA: Diagnosis not present

## 2019-12-09 MED ORDER — CYANOCOBALAMIN 1000 MCG/ML IJ SOLN
1000.0000 ug | Freq: Once | INTRAMUSCULAR | Status: AC
  Administered 2019-12-09: 1000 ug via INTRAMUSCULAR

## 2019-12-09 NOTE — Progress Notes (Signed)
Pt came in for his B12 injection. Injection given in the right deltoid, pt tolerated injection well.  

## 2019-12-12 DIAGNOSIS — C61 Malignant neoplasm of prostate: Secondary | ICD-10-CM | POA: Diagnosis not present

## 2019-12-12 DIAGNOSIS — N393 Stress incontinence (female) (male): Secondary | ICD-10-CM | POA: Diagnosis not present

## 2019-12-12 DIAGNOSIS — N5231 Erectile dysfunction following radical prostatectomy: Secondary | ICD-10-CM | POA: Diagnosis not present

## 2019-12-24 DIAGNOSIS — D1801 Hemangioma of skin and subcutaneous tissue: Secondary | ICD-10-CM | POA: Diagnosis not present

## 2019-12-24 DIAGNOSIS — D225 Melanocytic nevi of trunk: Secondary | ICD-10-CM | POA: Diagnosis not present

## 2019-12-24 DIAGNOSIS — L814 Other melanin hyperpigmentation: Secondary | ICD-10-CM | POA: Diagnosis not present

## 2019-12-24 DIAGNOSIS — L821 Other seborrheic keratosis: Secondary | ICD-10-CM | POA: Diagnosis not present

## 2019-12-24 DIAGNOSIS — L570 Actinic keratosis: Secondary | ICD-10-CM | POA: Diagnosis not present

## 2019-12-24 DIAGNOSIS — Z85828 Personal history of other malignant neoplasm of skin: Secondary | ICD-10-CM | POA: Diagnosis not present

## 2019-12-31 ENCOUNTER — Other Ambulatory Visit: Payer: Self-pay

## 2020-01-01 ENCOUNTER — Ambulatory Visit (INDEPENDENT_AMBULATORY_CARE_PROVIDER_SITE_OTHER): Payer: PPO

## 2020-01-01 ENCOUNTER — Other Ambulatory Visit: Payer: Self-pay

## 2020-01-01 DIAGNOSIS — E538 Deficiency of other specified B group vitamins: Secondary | ICD-10-CM

## 2020-01-01 MED ORDER — CYANOCOBALAMIN 1000 MCG/ML IJ SOLN
1000.0000 ug | Freq: Once | INTRAMUSCULAR | Status: AC
  Administered 2020-01-01: 10:00:00 1000 ug via INTRAMUSCULAR

## 2020-01-01 NOTE — Progress Notes (Signed)
Administered b12 on the right arm.

## 2020-01-23 ENCOUNTER — Ambulatory Visit (INDEPENDENT_AMBULATORY_CARE_PROVIDER_SITE_OTHER): Payer: PPO

## 2020-01-23 ENCOUNTER — Other Ambulatory Visit: Payer: Self-pay

## 2020-01-23 DIAGNOSIS — E538 Deficiency of other specified B group vitamins: Secondary | ICD-10-CM | POA: Diagnosis not present

## 2020-01-23 MED ORDER — CYANOCOBALAMIN 1000 MCG/ML IJ SOLN
1000.0000 ug | Freq: Once | INTRAMUSCULAR | Status: AC
  Administered 2020-01-23: 1000 ug via INTRAMUSCULAR

## 2020-01-23 NOTE — Progress Notes (Signed)
B12 injection given. Patient tolerated well.

## 2020-01-24 ENCOUNTER — Ambulatory Visit: Payer: PPO

## 2020-01-27 ENCOUNTER — Ambulatory Visit: Payer: PPO | Admitting: Family Medicine

## 2020-01-27 ENCOUNTER — Other Ambulatory Visit: Payer: Self-pay

## 2020-01-28 ENCOUNTER — Ambulatory Visit (INDEPENDENT_AMBULATORY_CARE_PROVIDER_SITE_OTHER): Payer: PPO | Admitting: Family Medicine

## 2020-01-28 ENCOUNTER — Encounter: Payer: Self-pay | Admitting: Family Medicine

## 2020-01-28 VITALS — BP 148/79 | HR 74 | Ht 71.0 in | Wt 239.0 lb

## 2020-01-28 DIAGNOSIS — K219 Gastro-esophageal reflux disease without esophagitis: Secondary | ICD-10-CM | POA: Diagnosis not present

## 2020-01-29 ENCOUNTER — Ambulatory Visit: Payer: PPO

## 2020-01-29 ENCOUNTER — Encounter: Payer: Self-pay | Admitting: Family Medicine

## 2020-01-29 NOTE — Progress Notes (Signed)
   Subjective:    Patient ID: Matthew House, male    DOB: 03/14/1940, 80 y.o.   MRN: [id number]  HPI Here asking for advice about GERD. He has been seeing Geradine Girt DDS for a dental abscess, and in the past few weeks he has taken 2 courses of Clindamycin. He is scheduled for an extraction on 02-13-20.  This has caused diarrhea, but it has also greatly exacerbated his GERD. He normally takes Pepcid 40 mg BID with good results. However lately he has had more heartburn, and the reflux causes him to cough a lot. His pharmacist suggested he add Gaviscon OTC to his regimen, and he asks if this would be a good idea. He has also been taking Activia, a probiotic.    Review of Systems  Constitutional: Negative.   Respiratory: Positive for cough. Negative for chest tightness, shortness of breath and wheezing.   Cardiovascular: Negative.   Gastrointestinal: Positive for diarrhea. Negative for abdominal distention and abdominal pain.       Objective:   Physical Exam Constitutional:      Appearance: Normal appearance.  Cardiovascular:     Rate and Rhythm: Normal rate and regular rhythm.     Pulses: Normal pulses.     Heart sounds: Normal heart sounds.  Pulmonary:     Effort: Pulmonary effort is normal.     Breath sounds: Normal breath sounds.  Neurological:     Mental Status: He is alert.           Assessment & Plan:  His GERD has been worsened by recent antibiotic use. I agreed that Gaviscon would be a good thing to try. This is safe and likely to be effective. Recheck prn.  Alysia Penna, MD

## 2020-01-31 ENCOUNTER — Ambulatory Visit: Payer: PPO

## 2020-02-04 DIAGNOSIS — M1711 Unilateral primary osteoarthritis, right knee: Secondary | ICD-10-CM | POA: Diagnosis not present

## 2020-02-04 DIAGNOSIS — M9906 Segmental and somatic dysfunction of lower extremity: Secondary | ICD-10-CM | POA: Diagnosis not present

## 2020-02-04 DIAGNOSIS — M25561 Pain in right knee: Secondary | ICD-10-CM | POA: Diagnosis not present

## 2020-02-04 DIAGNOSIS — M25551 Pain in right hip: Secondary | ICD-10-CM | POA: Diagnosis not present

## 2020-02-13 ENCOUNTER — Ambulatory Visit: Payer: PPO | Admitting: Family Medicine

## 2020-02-14 ENCOUNTER — Ambulatory Visit (INDEPENDENT_AMBULATORY_CARE_PROVIDER_SITE_OTHER): Payer: PPO | Admitting: *Deleted

## 2020-02-14 ENCOUNTER — Telehealth: Payer: Self-pay | Admitting: Family Medicine

## 2020-02-14 ENCOUNTER — Other Ambulatory Visit: Payer: Self-pay

## 2020-02-14 DIAGNOSIS — E538 Deficiency of other specified B group vitamins: Secondary | ICD-10-CM

## 2020-02-14 MED ORDER — CYANOCOBALAMIN 1000 MCG/ML IJ SOLN: 1000.0000 ug | Freq: Once | INTRAMUSCULAR | Status: DC

## 2020-02-14 MED ORDER — CYANOCOBALAMIN 1000 MCG/ML IJ SOLN
1000.0000 ug | Freq: Once | INTRAMUSCULAR | Status: AC
  Administered 2020-02-14: 1000 ug via INTRAMUSCULAR

## 2020-02-14 NOTE — Progress Notes (Signed)
Per orders of Cory Nafziger NP, injection of B12 given by Tove Wideman. Patient tolerated injection well. 

## 2020-02-14 NOTE — Telephone Encounter (Signed)
Please disregard--Rachel placed orders.

## 2020-02-14 NOTE — Telephone Encounter (Signed)
Patient states that in 6 mths from his last physical he was supposed to get his B12 checked--there are no lab orders for April for B12 labs.

## 2020-02-21 ENCOUNTER — Ambulatory Visit: Payer: PPO

## 2020-03-03 ENCOUNTER — Other Ambulatory Visit (INDEPENDENT_AMBULATORY_CARE_PROVIDER_SITE_OTHER): Payer: PPO

## 2020-03-03 ENCOUNTER — Other Ambulatory Visit: Payer: Self-pay

## 2020-03-03 DIAGNOSIS — E538 Deficiency of other specified B group vitamins: Secondary | ICD-10-CM | POA: Diagnosis not present

## 2020-03-03 LAB — VITAMIN B12: Vitamin B-12: 635 pg/mL (ref 211–911)

## 2020-03-06 ENCOUNTER — Other Ambulatory Visit: Payer: Self-pay

## 2020-03-06 ENCOUNTER — Ambulatory Visit (INDEPENDENT_AMBULATORY_CARE_PROVIDER_SITE_OTHER): Payer: PPO

## 2020-03-06 DIAGNOSIS — E538 Deficiency of other specified B group vitamins: Secondary | ICD-10-CM | POA: Diagnosis not present

## 2020-03-06 MED ORDER — CYANOCOBALAMIN 1000 MCG/ML IJ SOLN
1000.0000 ug | Freq: Once | INTRAMUSCULAR | Status: AC
  Administered 2020-03-06: 1000 ug via INTRAMUSCULAR

## 2020-03-06 NOTE — Progress Notes (Signed)
Per orders of Dr. Fry, injection of Cyanocobalamin 1000 mcg/mL given by Laiden Milles N Rosa Wyly. Patient tolerated injection well.  

## 2020-04-01 ENCOUNTER — Ambulatory Visit (INDEPENDENT_AMBULATORY_CARE_PROVIDER_SITE_OTHER): Payer: PPO

## 2020-04-01 ENCOUNTER — Other Ambulatory Visit: Payer: Self-pay

## 2020-04-01 DIAGNOSIS — E538 Deficiency of other specified B group vitamins: Secondary | ICD-10-CM | POA: Diagnosis not present

## 2020-04-01 MED ORDER — CYANOCOBALAMIN 1000 MCG/ML IJ SOLN
1000.0000 ug | Freq: Once | INTRAMUSCULAR | Status: AC
  Administered 2020-04-01: 1000 ug via INTRAMUSCULAR

## 2020-04-01 NOTE — Progress Notes (Signed)
Per orders of Dr. Fry, injection of Cyanocobalamin 1000 mcg/mL given by Terrin Imparato N Cline Draheim. Patient tolerated injection well.  

## 2020-04-03 ENCOUNTER — Ambulatory Visit: Payer: PPO

## 2020-04-27 ENCOUNTER — Other Ambulatory Visit: Payer: Self-pay

## 2020-04-27 ENCOUNTER — Ambulatory Visit (INDEPENDENT_AMBULATORY_CARE_PROVIDER_SITE_OTHER): Payer: PPO

## 2020-04-27 DIAGNOSIS — E538 Deficiency of other specified B group vitamins: Secondary | ICD-10-CM | POA: Diagnosis not present

## 2020-04-27 MED ORDER — CYANOCOBALAMIN 1000 MCG/ML IJ SOLN
1000.0000 ug | Freq: Once | INTRAMUSCULAR | Status: AC
  Administered 2020-04-27: 1000 ug via INTRAMUSCULAR

## 2020-04-27 NOTE — Progress Notes (Signed)
Per orders of Dr. Fry, injection of Cyanocobalamin 1000 mcg/mL given by Debra Calabretta N Jalina Blowers. Patient tolerated injection well.  

## 2020-05-04 DIAGNOSIS — N5231 Erectile dysfunction following radical prostatectomy: Secondary | ICD-10-CM | POA: Diagnosis not present

## 2020-05-04 DIAGNOSIS — N393 Stress incontinence (female) (male): Secondary | ICD-10-CM | POA: Diagnosis not present

## 2020-05-04 DIAGNOSIS — C61 Malignant neoplasm of prostate: Secondary | ICD-10-CM | POA: Diagnosis not present

## 2020-05-20 DIAGNOSIS — S39011A Strain of muscle, fascia and tendon of abdomen, initial encounter: Secondary | ICD-10-CM | POA: Diagnosis not present

## 2020-05-20 DIAGNOSIS — R1031 Right lower quadrant pain: Secondary | ICD-10-CM | POA: Diagnosis not present

## 2020-05-20 DIAGNOSIS — M542 Cervicalgia: Secondary | ICD-10-CM | POA: Diagnosis not present

## 2020-05-20 DIAGNOSIS — M9901 Segmental and somatic dysfunction of cervical region: Secondary | ICD-10-CM | POA: Diagnosis not present

## 2020-05-20 DIAGNOSIS — M9902 Segmental and somatic dysfunction of thoracic region: Secondary | ICD-10-CM | POA: Diagnosis not present

## 2020-05-25 NOTE — Patient Instructions (Signed)
Health Maintenance Due  Topic Date Due  . URINE MICROALBUMIN  Never done  . COVID-19 Vaccine (1) Never done  . Hepatitis C Screening  Never done    Depression screen [facility] 2/9 10/24/2019 10/17/2017 10/11/2016  Decreased Interest 0 0 0  Down, Depressed, Hopeless 0 0 0  PHQ - 2 Score 0 0 0  Altered sleeping 0 - 0  Tired, decreased energy 0 - 0  Change in appetite 0 - 0  Feeling bad or failure about yourself  0 - 0  Trouble concentrating 0 - 0  Moving slowly or fidgety/restless 0 - 0  Suicidal thoughts 0 - 0  PHQ-9 Score 0 - 0  Difficult doing work/chores - - Not difficult at all

## 2020-05-26 ENCOUNTER — Ambulatory Visit (INDEPENDENT_AMBULATORY_CARE_PROVIDER_SITE_OTHER): Payer: PPO

## 2020-05-26 ENCOUNTER — Other Ambulatory Visit: Payer: Self-pay

## 2020-05-26 DIAGNOSIS — E538 Deficiency of other specified B group vitamins: Secondary | ICD-10-CM

## 2020-05-26 MED ORDER — CYANOCOBALAMIN 1000 MCG/ML IJ SOLN
1000.0000 ug | Freq: Once | INTRAMUSCULAR | Status: AC
  Administered 2020-05-26: 1000 ug via INTRAMUSCULAR

## 2020-05-26 NOTE — Progress Notes (Signed)
Per orders of Dr. Fry, injection of Cyanocobalamin 1000 mcg given by Dynisha Due L Alexiya Franqui. °Patient tolerated injection well.  °

## 2020-05-27 ENCOUNTER — Ambulatory Visit: Payer: PPO

## 2020-06-16 ENCOUNTER — Ambulatory Visit (INDEPENDENT_AMBULATORY_CARE_PROVIDER_SITE_OTHER): Payer: PPO

## 2020-06-16 ENCOUNTER — Other Ambulatory Visit: Payer: Self-pay

## 2020-06-16 DIAGNOSIS — E538 Deficiency of other specified B group vitamins: Secondary | ICD-10-CM | POA: Diagnosis not present

## 2020-06-16 MED ORDER — CYANOCOBALAMIN 1000 MCG/ML IJ SOLN
1000.0000 ug | Freq: Once | INTRAMUSCULAR | Status: AC
  Administered 2020-06-16: 1000 ug via INTRAMUSCULAR

## 2020-06-16 NOTE — Progress Notes (Signed)
Per orders of Dr. Fry, injection of Cyanocobalamin 1,000 mcg/mL given by Lyza Houseworth N Yenty Bloch. Patient tolerated injection well.  

## 2020-07-10 ENCOUNTER — Other Ambulatory Visit: Payer: Self-pay

## 2020-07-10 ENCOUNTER — Ambulatory Visit (INDEPENDENT_AMBULATORY_CARE_PROVIDER_SITE_OTHER): Payer: PPO

## 2020-07-10 DIAGNOSIS — E538 Deficiency of other specified B group vitamins: Secondary | ICD-10-CM | POA: Diagnosis not present

## 2020-07-10 MED ORDER — CYANOCOBALAMIN 1000 MCG/ML IJ SOLN
1000.0000 ug | Freq: Once | INTRAMUSCULAR | Status: AC
  Administered 2020-07-10: 1000 ug via INTRAMUSCULAR

## 2020-07-10 NOTE — Progress Notes (Signed)
Per orders of Dr. Fry, injection of Cyanocobalamin 1000 mcg given by Barbi Kumagai L Malachai Schalk. °Patient tolerated injection well.  °

## 2020-07-31 ENCOUNTER — Ambulatory Visit (INDEPENDENT_AMBULATORY_CARE_PROVIDER_SITE_OTHER): Payer: PPO

## 2020-07-31 DIAGNOSIS — E538 Deficiency of other specified B group vitamins: Secondary | ICD-10-CM | POA: Diagnosis not present

## 2020-07-31 MED ORDER — CYANOCOBALAMIN 1000 MCG/ML IJ SOLN
1000.0000 ug | Freq: Once | INTRAMUSCULAR | Status: AC
  Administered 2020-07-31: 1000 ug via INTRAMUSCULAR

## 2020-07-31 NOTE — Progress Notes (Signed)
Per orders of [name], MD, injection of B12 given in left  deltoid by Manuela Halbur D Ronrico Dupin. Patient tolerated injection well.  Lab Results  Component Value Date   [id number] 03/03/2020

## 2020-08-04 DIAGNOSIS — L718 Other rosacea: Secondary | ICD-10-CM | POA: Diagnosis not present

## 2020-08-04 DIAGNOSIS — Z85828 Personal history of other malignant neoplasm of skin: Secondary | ICD-10-CM | POA: Diagnosis not present

## 2020-08-20 ENCOUNTER — Ambulatory Visit (INDEPENDENT_AMBULATORY_CARE_PROVIDER_SITE_OTHER): Payer: PPO

## 2020-08-20 ENCOUNTER — Other Ambulatory Visit: Payer: Self-pay

## 2020-08-20 DIAGNOSIS — E538 Deficiency of other specified B group vitamins: Secondary | ICD-10-CM

## 2020-08-20 MED ORDER — CYANOCOBALAMIN 1000 MCG/ML IJ SOLN
1000.0000 ug | Freq: Once | INTRAMUSCULAR | Status: AC
  Administered 2020-08-20: 1000 ug via INTRAMUSCULAR

## 2020-08-20 NOTE — Progress Notes (Signed)
Per orders of Dr. Sarajane Jews, injection of B12 given by Rebecca Eaton. Patient tolerated injection well.  Please review in Dr. Barbie Banner absence.

## 2020-08-26 DIAGNOSIS — K432 Incisional hernia without obstruction or gangrene: Secondary | ICD-10-CM | POA: Diagnosis not present

## 2020-08-26 DIAGNOSIS — R232 Flushing: Secondary | ICD-10-CM | POA: Diagnosis not present

## 2020-08-26 DIAGNOSIS — M1711 Unilateral primary osteoarthritis, right knee: Secondary | ICD-10-CM | POA: Diagnosis not present

## 2020-09-10 ENCOUNTER — Other Ambulatory Visit: Payer: Self-pay

## 2020-09-10 ENCOUNTER — Ambulatory Visit (INDEPENDENT_AMBULATORY_CARE_PROVIDER_SITE_OTHER): Payer: PPO

## 2020-09-10 DIAGNOSIS — E538 Deficiency of other specified B group vitamins: Secondary | ICD-10-CM | POA: Diagnosis not present

## 2020-09-10 MED ORDER — CYANOCOBALAMIN 1000 MCG/ML IJ SOLN
1000.0000 ug | Freq: Once | INTRAMUSCULAR | Status: AC
  Administered 2020-09-10: 1000 ug via INTRAMUSCULAR

## 2020-09-10 NOTE — Progress Notes (Signed)
Per orders of Dr. Fry, injection of B12 given by Beyonca Wisz. Patient tolerated injection well.  

## 2020-09-22 ENCOUNTER — Encounter: Payer: Self-pay | Admitting: Gastroenterology

## 2020-09-30 DIAGNOSIS — M1711 Unilateral primary osteoarthritis, right knee: Secondary | ICD-10-CM | POA: Diagnosis not present

## 2020-10-01 ENCOUNTER — Ambulatory Visit (INDEPENDENT_AMBULATORY_CARE_PROVIDER_SITE_OTHER): Payer: PPO

## 2020-10-01 ENCOUNTER — Other Ambulatory Visit: Payer: Self-pay

## 2020-10-01 ENCOUNTER — Ambulatory Visit: Payer: PPO

## 2020-10-01 DIAGNOSIS — E538 Deficiency of other specified B group vitamins: Secondary | ICD-10-CM

## 2020-10-01 MED ORDER — CYANOCOBALAMIN 1000 MCG/ML IJ SOLN
1000.0000 ug | Freq: Once | INTRAMUSCULAR | Status: AC
  Administered 2020-10-01: 1000 ug via INTRAMUSCULAR

## 2020-10-01 NOTE — Progress Notes (Signed)
Per orders from Dr Sarajane Jews, pt was given a B12 injection by Madaline Guthrie, pt tolerated well.

## 2020-10-07 DIAGNOSIS — M1711 Unilateral primary osteoarthritis, right knee: Secondary | ICD-10-CM | POA: Diagnosis not present

## 2020-10-14 DIAGNOSIS — M1711 Unilateral primary osteoarthritis, right knee: Secondary | ICD-10-CM | POA: Diagnosis not present

## 2020-10-14 DIAGNOSIS — M25561 Pain in right knee: Secondary | ICD-10-CM | POA: Diagnosis not present

## 2020-10-16 DIAGNOSIS — M25461 Effusion, right knee: Secondary | ICD-10-CM | POA: Diagnosis not present

## 2020-10-16 DIAGNOSIS — M1711 Unilateral primary osteoarthritis, right knee: Secondary | ICD-10-CM | POA: Diagnosis not present

## 2020-10-22 ENCOUNTER — Other Ambulatory Visit: Payer: Self-pay

## 2020-10-22 ENCOUNTER — Ambulatory Visit (INDEPENDENT_AMBULATORY_CARE_PROVIDER_SITE_OTHER): Payer: PPO | Admitting: *Deleted

## 2020-10-22 DIAGNOSIS — E538 Deficiency of other specified B group vitamins: Secondary | ICD-10-CM

## 2020-10-22 MED ORDER — CYANOCOBALAMIN 1000 MCG/ML IJ SOLN
1000.0000 ug | Freq: Once | INTRAMUSCULAR | Status: AC
  Administered 2020-10-22: 1000 ug via INTRAMUSCULAR

## 2020-10-22 NOTE — Progress Notes (Signed)
Per orders of Dr. Fry, injection of Cyanocobalamin 1000mcg given by Courtnay Petrilla A. Patient tolerated injection well.  

## 2020-10-25 ENCOUNTER — Other Ambulatory Visit: Payer: Self-pay | Admitting: Family Medicine

## 2020-10-25 DIAGNOSIS — N5089 Other specified disorders of the male genital organs: Secondary | ICD-10-CM

## 2020-10-25 DIAGNOSIS — L719 Rosacea, unspecified: Secondary | ICD-10-CM

## 2020-10-25 DIAGNOSIS — E7800 Pure hypercholesterolemia, unspecified: Secondary | ICD-10-CM

## 2020-10-30 DIAGNOSIS — H25813 Combined forms of age-related cataract, bilateral: Secondary | ICD-10-CM | POA: Diagnosis not present

## 2020-11-04 ENCOUNTER — Encounter: Payer: PPO | Admitting: Family Medicine

## 2020-11-12 ENCOUNTER — Ambulatory Visit (INDEPENDENT_AMBULATORY_CARE_PROVIDER_SITE_OTHER): Payer: PPO

## 2020-11-12 DIAGNOSIS — E538 Deficiency of other specified B group vitamins: Secondary | ICD-10-CM

## 2020-11-12 MED ORDER — CYANOCOBALAMIN 1000 MCG/ML IJ SOLN
1000.0000 ug | Freq: Once | INTRAMUSCULAR | Status: AC
  Administered 2020-11-12: 1000 ug via INTRAMUSCULAR

## 2020-11-12 NOTE — Progress Notes (Signed)
Per orders of Dr. Fry, injection of B12 given by Baldomero Mirarchi. Patient tolerated injection well.  

## 2020-11-21 ENCOUNTER — Other Ambulatory Visit: Payer: Self-pay | Admitting: Family Medicine

## 2020-11-21 DIAGNOSIS — E7800 Pure hypercholesterolemia, unspecified: Secondary | ICD-10-CM

## 2020-11-21 DIAGNOSIS — L719 Rosacea, unspecified: Secondary | ICD-10-CM

## 2020-11-21 DIAGNOSIS — N5089 Other specified disorders of the male genital organs: Secondary | ICD-10-CM

## 2020-12-03 ENCOUNTER — Ambulatory Visit (INDEPENDENT_AMBULATORY_CARE_PROVIDER_SITE_OTHER): Payer: PPO | Admitting: Family Medicine

## 2020-12-03 ENCOUNTER — Ambulatory Visit: Payer: PPO

## 2020-12-03 ENCOUNTER — Encounter: Payer: Self-pay | Admitting: Family Medicine

## 2020-12-03 VITALS — BP 120/70 | HR 82 | Temp 97.9°F | Ht 71.0 in | Wt 235.0 lb

## 2020-12-03 DIAGNOSIS — E538 Deficiency of other specified B group vitamins: Secondary | ICD-10-CM | POA: Diagnosis not present

## 2020-12-03 DIAGNOSIS — Z0000 Encounter for general adult medical examination without abnormal findings: Secondary | ICD-10-CM | POA: Diagnosis not present

## 2020-12-03 DIAGNOSIS — N5089 Other specified disorders of the male genital organs: Secondary | ICD-10-CM

## 2020-12-03 DIAGNOSIS — E7800 Pure hypercholesterolemia, unspecified: Secondary | ICD-10-CM | POA: Diagnosis not present

## 2020-12-03 DIAGNOSIS — L719 Rosacea, unspecified: Secondary | ICD-10-CM | POA: Diagnosis not present

## 2020-12-03 LAB — CBC WITH DIFFERENTIAL/PLATELET
Basophils Absolute: 0 10*3/uL (ref 0.0–0.1)
Basophils Relative: 0.6 % (ref 0.0–3.0)
Eosinophils Absolute: 0.2 10*3/uL (ref 0.0–0.7)
Eosinophils Relative: 3 % (ref 0.0–5.0)
HCT: 36.1 % — ABNORMAL LOW (ref 39.0–52.0)
Hemoglobin: 12.3 g/dL — ABNORMAL LOW (ref 13.0–17.0)
Lymphocytes Relative: 26.1 % (ref 12.0–46.0)
Lymphs Abs: 1.7 10*3/uL (ref 0.7–4.0)
MCHC: 34.1 g/dL (ref 30.0–36.0)
MCV: 85.1 fl (ref 78.0–100.0)
Monocytes Absolute: 0.6 10*3/uL (ref 0.1–1.0)
Monocytes Relative: 9 % (ref 3.0–12.0)
Neutro Abs: 3.9 10*3/uL (ref 1.4–7.7)
Neutrophils Relative %: 61.3 % (ref 43.0–77.0)
Platelets: 166 10*3/uL (ref 150.0–400.0)
RBC: 4.24 Mil/uL (ref 4.22–5.81)
RDW: 15 % (ref 11.5–15.5)
WBC: 6.4 10*3/uL (ref 4.0–10.5)

## 2020-12-03 LAB — LIPID PANEL
Cholesterol: 94 mg/dL (ref 0–200)
HDL: 27.5 mg/dL — ABNORMAL LOW (ref >39.00)
NonHDL: 66.73
Total CHOL/HDL Ratio: 3
Triglycerides: 233 mg/dL — ABNORMAL HIGH (ref 0.0–149.0)
VLDL: 46.6 mg/dL — ABNORMAL HIGH (ref 0.0–40.0)

## 2020-12-03 LAB — LDL CHOLESTEROL, DIRECT: Direct LDL: 44 mg/dL

## 2020-12-03 LAB — BASIC METABOLIC PANEL
BUN: 13 mg/dL (ref 6–23)
CO2: 30 mEq/L (ref 19–32)
Calcium: 9.8 mg/dL (ref 8.4–10.5)
Chloride: 104 mEq/L (ref 96–112)
Creatinine, Ser: 0.81 mg/dL (ref 0.40–1.50)
GFR: 83.89 mL/min (ref >60.00)
Glucose, Bld: 98 mg/dL (ref 70–99)
Potassium: 3.8 mEq/L (ref 3.5–5.1)
Sodium: 140 mEq/L (ref 135–145)

## 2020-12-03 LAB — HEPATIC FUNCTION PANEL
ALT: 35 U/L (ref 0–53)
AST: 25 U/L (ref 0–37)
Albumin: 4.8 g/dL (ref 3.5–5.2)
Alkaline Phosphatase: 59 U/L (ref 39–117)
Bilirubin, Direct: 0.2 mg/dL (ref 0.0–0.3)
Total Bilirubin: 1.1 mg/dL (ref 0.2–1.2)
Total Protein: 7.7 g/dL (ref 6.0–8.3)

## 2020-12-03 LAB — HEMOGLOBIN A1C: Hgb A1c MFr Bld: 5.4 % (ref 4.6–6.5)

## 2020-12-03 LAB — VITAMIN B12: Vitamin B-12: 654 pg/mL (ref 211–911)

## 2020-12-03 LAB — TSH: TSH: 2.54 u[IU]/mL (ref 0.35–5.50)

## 2020-12-03 MED ORDER — FAMOTIDINE 40 MG PO TABS: 40.0000 mg | ORAL_TABLET | Freq: Two times a day (BID) | ORAL | 11 refills | Status: DC

## 2020-12-03 MED ORDER — DOXYCYCLINE HYCLATE 100 MG PO CAPS
100.0000 mg | ORAL_CAPSULE | Freq: Every morning | ORAL | 11 refills | Status: DC
Start: 2020-12-03 — End: 2021-08-10

## 2020-12-03 MED ORDER — SIMVASTATIN 40 MG PO TABS
40.0000 mg | ORAL_TABLET | Freq: Every day | ORAL | 11 refills | Status: DC
Start: 2020-12-03 — End: 2021-08-17

## 2020-12-03 MED ORDER — CYANOCOBALAMIN 1000 MCG/ML IJ SOLN: 1000.0000 ug | INTRAMUSCULAR | 0 refills | Status: AC

## 2020-12-03 NOTE — Progress Notes (Signed)
   Subjective:    Patient ID: Matthew House, male    DOB: [birth year], 80 y.o.   MRN: [id number]  HPI Here for a well exam. He feels great. Since he started taking B12 shots every month the burning in his feet has resolved. He sees Dr. Tresa Moore for prostate checks and so far his PSA remains at zero.     Review of Systems  Constitutional: Negative.   HENT: Negative.    Eyes: Negative.   Respiratory: Negative.    Cardiovascular: Negative.   Gastrointestinal: Negative.   Genitourinary: Negative.   Musculoskeletal: Negative.   Skin: Negative.   Neurological: Negative.   Psychiatric/Behavioral: Negative.        Objective:   Physical Exam Constitutional:      General: He is not in acute distress.    Appearance: Normal appearance. He is well-developed. He is not diaphoretic.  HENT:     Head: Normocephalic and atraumatic.     Right Ear: External ear normal.     Left Ear: External ear normal.     Nose: Nose normal.     Mouth/Throat:     Pharynx: No oropharyngeal exudate.  Eyes:     General: No scleral icterus.       Right eye: No discharge.        Left eye: No discharge.     Conjunctiva/sclera: Conjunctivae normal.     Pupils: Pupils are equal, round, and reactive to light.  Neck:     Thyroid: No thyromegaly.     Vascular: No JVD.     Trachea: No tracheal deviation.  Cardiovascular:     Rate and Rhythm: Normal rate and regular rhythm.     Heart sounds: Normal heart sounds. No murmur heard.   No friction rub. No gallop.  Pulmonary:     Effort: Pulmonary effort is normal. No respiratory distress.     Breath sounds: Normal breath sounds. No wheezing or rales.  Chest:     Chest wall: No tenderness.  Abdominal:     General: Bowel sounds are normal. There is no distension.     Palpations: Abdomen is soft. There is no mass.     Tenderness: There is no abdominal tenderness. There is no guarding or rebound.  Genitourinary:    Penis: No tenderness.   Musculoskeletal:         General: No tenderness. Normal range of motion.     Cervical back: Neck supple.  Lymphadenopathy:     Cervical: No cervical adenopathy.  Skin:    General: Skin is warm and dry.     Coloration: Skin is not pale.     Findings: No erythema or rash.  Neurological:     Mental Status: He is alert and oriented to person, place, and time.     Cranial Nerves: No cranial nerve deficit.     Motor: No abnormal muscle tone.     Coordination: Coordination normal.     Deep Tendon Reflexes: Reflexes are normal and symmetric. Reflexes normal.  Psychiatric:        Behavior: Behavior normal.        Thought Content: Thought content normal.        Judgment: Judgment normal.          Assessment & Plan:  Well exam. We discussed diet and exercise. Get fasting labs. Alysia Penna, MD

## 2020-12-08 DIAGNOSIS — C61 Malignant neoplasm of prostate: Secondary | ICD-10-CM | POA: Diagnosis not present

## 2020-12-09 ENCOUNTER — Telehealth: Payer: Self-pay | Admitting: *Deleted

## 2020-12-09 ENCOUNTER — Ambulatory Visit (INDEPENDENT_AMBULATORY_CARE_PROVIDER_SITE_OTHER): Payer: PPO

## 2020-12-09 ENCOUNTER — Telehealth: Payer: Self-pay | Admitting: Family Medicine

## 2020-12-09 DIAGNOSIS — E538 Deficiency of other specified B group vitamins: Secondary | ICD-10-CM | POA: Diagnosis not present

## 2020-12-09 MED ORDER — CYANOCOBALAMIN 1000 MCG/ML IJ SOLN
1000.0000 ug | Freq: Once | INTRAMUSCULAR | Status: AC
  Administered 2020-12-09: 1000 ug via INTRAMUSCULAR

## 2020-12-09 NOTE — Telephone Encounter (Signed)
Patient came into the office today for a nurse visit.  Patient is complaining of right knee pain.  Patient will be going out of town 12/24/20.  Patient has been taking tylenol with some relief.  Patient would like to know if he could  get an xray or a possible cortisone shot for his knee before he goes out of town?  Patient is aware that he may need to schedule an office visit.

## 2020-12-09 NOTE — Progress Notes (Signed)
Pt here for monthly B12 injection per Dr Sarajane Jews  B12 1035mcg given IM, and pt tolerated injection well.  Next B12 injection scheduled for 12/30/2020 at 9 am

## 2020-12-09 NOTE — Telephone Encounter (Signed)
Patient called to follow up on voicemail left by Matthew House, I let patient know that Matthew House was unavailable and it would probably be Izora Gala that returns his call as she is Dr.Fry's CMA. Patient stated that he was recommended to go to Dr.Rigby's office for injections and that he would have him put the order in for an xray on his knee to be done at this office. Patient stated he does not need a call back at this time      Please advise

## 2020-12-09 NOTE — Telephone Encounter (Signed)
Left detailed message on machine for patient with Dr Barbie Banner recommendation. Patient should call back if a referral is needed.

## 2020-12-09 NOTE — Telephone Encounter (Signed)
Patient came in today for his B12 and asked if he could speak to someone about his lab results from his last physical. He says that he has three questions to ask.  Informed patient that a message could be sent and that someone could give him a call.   Please advise.

## 2020-12-09 NOTE — Telephone Encounter (Signed)
Matthew House spoke with patient.    He stated that he had concerns about his HCL level and VLDL level so he spoke with his pharmacist.    Pharmacist advised patient to take a low dose iron tablet, and those level should rise.    He stated he will start iron tablet and speak with Dr. Sarajane Jews about it on his next office visit .

## 2020-12-09 NOTE — Telephone Encounter (Signed)
We do not give knee injections here, but his sports medicine provider (Dr.Michael Paulla Fore) does. I suggest he try to see Dr. Paulla Fore asap

## 2020-12-15 DIAGNOSIS — K432 Incisional hernia without obstruction or gangrene: Secondary | ICD-10-CM | POA: Diagnosis not present

## 2020-12-15 DIAGNOSIS — N393 Stress incontinence (female) (male): Secondary | ICD-10-CM | POA: Diagnosis not present

## 2020-12-15 DIAGNOSIS — N5231 Erectile dysfunction following radical prostatectomy: Secondary | ICD-10-CM | POA: Diagnosis not present

## 2020-12-15 DIAGNOSIS — C61 Malignant neoplasm of prostate: Secondary | ICD-10-CM | POA: Diagnosis not present

## 2020-12-17 NOTE — Telephone Encounter (Signed)
Tried to call pt regarding message no option of leaving a message for pt. Pt message noted

## 2020-12-23 NOTE — Progress Notes (Signed)
Erraneous encounter

## 2020-12-30 ENCOUNTER — Ambulatory Visit: Payer: PPO

## 2020-12-31 ENCOUNTER — Telehealth: Payer: Self-pay

## 2020-12-31 ENCOUNTER — Ambulatory Visit (INDEPENDENT_AMBULATORY_CARE_PROVIDER_SITE_OTHER): Payer: PPO

## 2020-12-31 DIAGNOSIS — E538 Deficiency of other specified B group vitamins: Secondary | ICD-10-CM

## 2020-12-31 MED ORDER — CYANOCOBALAMIN 1000 MCG/ML IJ SOLN
1000.0000 ug | INTRAMUSCULAR | Status: DC
Start: 2020-12-31 — End: 2021-04-15
  Administered 2020-12-31 – 2021-03-18 (×4): 1000 ug via INTRAMUSCULAR

## 2020-12-31 NOTE — Progress Notes (Signed)
Pt here for monthly B12 injection per Dr Sarajane Jews.  B12 1073mcg given IM right deltoid and pt tolerated injection well.  Next B12 injection scheduled for 01/21/21.

## 2020-12-31 NOTE — Telephone Encounter (Signed)
Last OV for preventative health 12/03/20. B12 lab 654 at that time. Verbal order rec'd from Dr Sarajane Jews for pt to continue monthly B12 injections.

## 2021-01-06 DIAGNOSIS — D692 Other nonthrombocytopenic purpura: Secondary | ICD-10-CM | POA: Diagnosis not present

## 2021-01-06 DIAGNOSIS — L821 Other seborrheic keratosis: Secondary | ICD-10-CM | POA: Diagnosis not present

## 2021-01-06 DIAGNOSIS — D225 Melanocytic nevi of trunk: Secondary | ICD-10-CM | POA: Diagnosis not present

## 2021-01-06 DIAGNOSIS — D1801 Hemangioma of skin and subcutaneous tissue: Secondary | ICD-10-CM | POA: Diagnosis not present

## 2021-01-21 ENCOUNTER — Ambulatory Visit (INDEPENDENT_AMBULATORY_CARE_PROVIDER_SITE_OTHER): Payer: PPO

## 2021-01-21 DIAGNOSIS — E538 Deficiency of other specified B group vitamins: Secondary | ICD-10-CM | POA: Diagnosis not present

## 2021-01-21 NOTE — Progress Notes (Signed)
Pt here for monthly B12 injection per Dr Sarajane Jews.  B12 1015mcg given IM left deltoid and pt tolerated injection well.  Next B12 injection scheduled for 02/18/21.

## 2021-01-21 NOTE — Telephone Encounter (Signed)
Pt states that every 3 week B12 injections "work better for me". Verbal order received from PCP that pt may have B12 injections q3weeks.

## 2021-02-18 ENCOUNTER — Ambulatory Visit (INDEPENDENT_AMBULATORY_CARE_PROVIDER_SITE_OTHER): Payer: PPO

## 2021-02-18 DIAGNOSIS — E538 Deficiency of other specified B group vitamins: Secondary | ICD-10-CM

## 2021-02-18 NOTE — Progress Notes (Signed)
Pt here for monthly B12 injection per Dr Sarajane Jews  B12 1028mcg given IM left deltoid and pt tolerated injection well.

## 2021-03-17 DIAGNOSIS — M9901 Segmental and somatic dysfunction of cervical region: Secondary | ICD-10-CM | POA: Diagnosis not present

## 2021-03-17 DIAGNOSIS — M9905 Segmental and somatic dysfunction of pelvic region: Secondary | ICD-10-CM | POA: Diagnosis not present

## 2021-03-17 DIAGNOSIS — M79642 Pain in left hand: Secondary | ICD-10-CM | POA: Diagnosis not present

## 2021-03-17 DIAGNOSIS — M533 Sacrococcygeal disorders, not elsewhere classified: Secondary | ICD-10-CM | POA: Diagnosis not present

## 2021-03-17 DIAGNOSIS — M542 Cervicalgia: Secondary | ICD-10-CM | POA: Diagnosis not present

## 2021-03-17 DIAGNOSIS — M9904 Segmental and somatic dysfunction of sacral region: Secondary | ICD-10-CM | POA: Diagnosis not present

## 2021-03-17 DIAGNOSIS — M9907 Segmental and somatic dysfunction of upper extremity: Secondary | ICD-10-CM | POA: Diagnosis not present

## 2021-03-17 DIAGNOSIS — W19XXXA Unspecified fall, initial encounter: Secondary | ICD-10-CM | POA: Diagnosis not present

## 2021-03-17 DIAGNOSIS — M9902 Segmental and somatic dysfunction of thoracic region: Secondary | ICD-10-CM | POA: Diagnosis not present

## 2021-03-18 ENCOUNTER — Ambulatory Visit (INDEPENDENT_AMBULATORY_CARE_PROVIDER_SITE_OTHER): Payer: PPO

## 2021-03-18 DIAGNOSIS — E538 Deficiency of other specified B group vitamins: Secondary | ICD-10-CM | POA: Diagnosis not present

## 2021-03-18 NOTE — Progress Notes (Signed)
Pt here for monthly B12 injection per Dr Sarajane Jews. ? ?B12 101mg given IM right deltoid and pt tolerated injection well. ? ?Next B12 injection scheduled for 04/15/21. ? ?

## 2021-03-19 ENCOUNTER — Ambulatory Visit: Payer: PPO

## 2021-03-31 ENCOUNTER — Telehealth: Payer: Self-pay | Admitting: Family Medicine

## 2021-03-31 DIAGNOSIS — M9905 Segmental and somatic dysfunction of pelvic region: Secondary | ICD-10-CM | POA: Diagnosis not present

## 2021-03-31 DIAGNOSIS — M79642 Pain in left hand: Secondary | ICD-10-CM | POA: Diagnosis not present

## 2021-03-31 DIAGNOSIS — M9907 Segmental and somatic dysfunction of upper extremity: Secondary | ICD-10-CM | POA: Diagnosis not present

## 2021-03-31 DIAGNOSIS — M9903 Segmental and somatic dysfunction of lumbar region: Secondary | ICD-10-CM | POA: Diagnosis not present

## 2021-03-31 DIAGNOSIS — M533 Sacrococcygeal disorders, not elsewhere classified: Secondary | ICD-10-CM | POA: Diagnosis not present

## 2021-03-31 DIAGNOSIS — W19XXXD Unspecified fall, subsequent encounter: Secondary | ICD-10-CM | POA: Diagnosis not present

## 2021-03-31 DIAGNOSIS — M542 Cervicalgia: Secondary | ICD-10-CM | POA: Diagnosis not present

## 2021-03-31 DIAGNOSIS — D649 Anemia, unspecified: Secondary | ICD-10-CM

## 2021-03-31 DIAGNOSIS — M9904 Segmental and somatic dysfunction of sacral region: Secondary | ICD-10-CM | POA: Diagnosis not present

## 2021-03-31 DIAGNOSIS — M9901 Segmental and somatic dysfunction of cervical region: Secondary | ICD-10-CM | POA: Diagnosis not present

## 2021-03-31 DIAGNOSIS — M9902 Segmental and somatic dysfunction of thoracic region: Secondary | ICD-10-CM | POA: Diagnosis not present

## 2021-03-31 DIAGNOSIS — M9908 Segmental and somatic dysfunction of rib cage: Secondary | ICD-10-CM | POA: Diagnosis not present

## 2021-03-31 NOTE — Telephone Encounter (Signed)
Patient came in because he would like a repeat of the labs done in December. Patient states that some of the labs were not normal and he would like a check to see where he is at now with those. He states he has been a bit lethargic due to the hemoglobin being low and would like that checked especially. Patient will need a callback to schedule for lab appointment.  ? ? ? ? ? ? ? ?Please advise  ? ? ? ?

## 2021-04-01 NOTE — Telephone Encounter (Signed)
Pt is scheduled for lab appointment on 04/02/2021 ?

## 2021-04-01 NOTE — Telephone Encounter (Signed)
I put in lab orders to recheck his anemia.  ?

## 2021-04-01 NOTE — Addendum Note (Signed)
Addended by: Alysia Penna A on: 04/01/2021 07:44 AM ? ? Modules accepted: Orders ? ?

## 2021-04-02 ENCOUNTER — Other Ambulatory Visit (INDEPENDENT_AMBULATORY_CARE_PROVIDER_SITE_OTHER): Payer: PPO

## 2021-04-02 DIAGNOSIS — D649 Anemia, unspecified: Secondary | ICD-10-CM | POA: Diagnosis not present

## 2021-04-02 LAB — CBC WITH DIFFERENTIAL/PLATELET
Basophils Absolute: 0 10*3/uL (ref 0.0–0.1)
Basophils Relative: 0.4 % (ref 0.0–3.0)
Eosinophils Absolute: 0.2 10*3/uL (ref 0.0–0.7)
Eosinophils Relative: 2.4 % (ref 0.0–5.0)
HCT: 34.7 % — ABNORMAL LOW (ref 39.0–52.0)
Hemoglobin: 12.1 g/dL — ABNORMAL LOW (ref 13.0–17.0)
Lymphocytes Relative: 27.5 % (ref 12.0–46.0)
Lymphs Abs: 2 10*3/uL (ref 0.7–4.0)
MCHC: 34.9 g/dL (ref 30.0–36.0)
MCV: 86.2 fl (ref 78.0–100.0)
Monocytes Absolute: 0.6 10*3/uL (ref 0.1–1.0)
Monocytes Relative: 7.8 % (ref 3.0–12.0)
Neutro Abs: 4.4 10*3/uL (ref 1.4–7.7)
Neutrophils Relative %: 61.9 % (ref 43.0–77.0)
Platelets: 163 10*3/uL (ref 150.0–400.0)
RBC: 4.03 Mil/uL — ABNORMAL LOW (ref 4.22–5.81)
RDW: 14.4 % (ref 11.5–15.5)
WBC: 7.1 10*3/uL (ref 4.0–10.5)

## 2021-04-02 LAB — IBC + FERRITIN
Ferritin: 104.9 ng/mL (ref 22.0–322.0)
Iron: 126 ug/dL (ref 42–165)
Saturation Ratios: 37.2 % (ref 20.0–50.0)
TIBC: 338.8 ug/dL (ref 250.0–450.0)
Transferrin: 242 mg/dL (ref 212.0–360.0)

## 2021-04-02 LAB — IRON: Iron: 126 ug/dL (ref 42–165)

## 2021-04-02 LAB — VITAMIN B12: Vitamin B-12: 748 pg/mL (ref 211–911)

## 2021-04-06 ENCOUNTER — Telehealth: Payer: Self-pay | Admitting: Family Medicine

## 2021-04-06 NOTE — Telephone Encounter (Signed)
Patient wants a call about his blood work. ?

## 2021-04-06 NOTE — Telephone Encounter (Signed)
See results note. 

## 2021-04-15 ENCOUNTER — Ambulatory Visit (INDEPENDENT_AMBULATORY_CARE_PROVIDER_SITE_OTHER): Payer: PPO

## 2021-04-15 DIAGNOSIS — E538 Deficiency of other specified B group vitamins: Secondary | ICD-10-CM | POA: Diagnosis not present

## 2021-04-15 MED ORDER — CYANOCOBALAMIN 1000 MCG/ML IJ SOLN
1000.0000 ug | Freq: Once | INTRAMUSCULAR | Status: AC
  Administered 2021-04-15: 1000 ug via INTRAMUSCULAR

## 2021-04-15 NOTE — Progress Notes (Signed)
Pt here for B12 injection (every 3 weeks) per Dr Sarajane Jews. ? ?B12 1014mg given IM left deltoid and pt tolerated injection well. ? ?Next B12 injection scheduled for 05/12/21. ? ?

## 2021-04-21 DIAGNOSIS — M1711 Unilateral primary osteoarthritis, right knee: Secondary | ICD-10-CM | POA: Diagnosis not present

## 2021-04-27 ENCOUNTER — Telehealth: Payer: Self-pay

## 2021-04-27 NOTE — Telephone Encounter (Signed)
--  Caller states that they are having black tarry stool x ?4 wks. ? ?04/26/2021 2:39:22 PM Go to ED Now Thad Ranger, RN, Langley Gauss ? ?Referrals ?[facility] - ED ?[facility] - [facility] ? ?04/27/21 1005: LVM instructions for pt to return call. Need more details. Can speak to [place] or [place]. ? ?

## 2021-05-11 ENCOUNTER — Ambulatory Visit (INDEPENDENT_AMBULATORY_CARE_PROVIDER_SITE_OTHER): Payer: PPO

## 2021-05-11 DIAGNOSIS — E538 Deficiency of other specified B group vitamins: Secondary | ICD-10-CM

## 2021-05-11 MED ORDER — CYANOCOBALAMIN 1000 MCG/ML IJ SOLN
1000.0000 ug | Freq: Once | INTRAMUSCULAR | Status: AC
  Administered 2021-05-11: 1000 ug via INTRAMUSCULAR

## 2021-05-11 NOTE — Progress Notes (Signed)
Pt here for monthly B12 injection per Dr Sarajane Jews ? ?B12 1022mg given IM, and pt tolerated injection well. ? ?Next B12 injection scheduled for  ?

## 2021-05-12 ENCOUNTER — Ambulatory Visit: Payer: PPO

## 2021-05-12 DIAGNOSIS — M9906 Segmental and somatic dysfunction of lower extremity: Secondary | ICD-10-CM | POA: Diagnosis not present

## 2021-05-12 DIAGNOSIS — M9902 Segmental and somatic dysfunction of thoracic region: Secondary | ICD-10-CM | POA: Diagnosis not present

## 2021-05-12 DIAGNOSIS — M9908 Segmental and somatic dysfunction of rib cage: Secondary | ICD-10-CM | POA: Diagnosis not present

## 2021-05-12 DIAGNOSIS — M546 Pain in thoracic spine: Secondary | ICD-10-CM | POA: Diagnosis not present

## 2021-05-12 DIAGNOSIS — M1711 Unilateral primary osteoarthritis, right knee: Secondary | ICD-10-CM | POA: Diagnosis not present

## 2021-05-12 DIAGNOSIS — M9901 Segmental and somatic dysfunction of cervical region: Secondary | ICD-10-CM | POA: Diagnosis not present

## 2021-05-13 ENCOUNTER — Ambulatory Visit: Payer: PPO | Admitting: Nurse Practitioner

## 2021-06-11 ENCOUNTER — Ambulatory Visit (INDEPENDENT_AMBULATORY_CARE_PROVIDER_SITE_OTHER): Payer: PPO

## 2021-06-11 DIAGNOSIS — E538 Deficiency of other specified B group vitamins: Secondary | ICD-10-CM | POA: Diagnosis not present

## 2021-06-11 MED ORDER — CYANOCOBALAMIN 1000 MCG/ML IJ SOLN
1000.0000 ug | Freq: Once | INTRAMUSCULAR | Status: AC
  Administered 2021-06-11: 1000 ug via INTRAMUSCULAR

## 2021-06-11 NOTE — Progress Notes (Signed)
Pt here for every 3 week B12 injection per Dr Sarajane Jews.  B12 1021mg given IM left deltoid and pt tolerated injection well.  Next B12 injection scheduled for 07/12/21.

## 2021-06-30 DIAGNOSIS — L814 Other melanin hyperpigmentation: Secondary | ICD-10-CM | POA: Diagnosis not present

## 2021-06-30 DIAGNOSIS — D1801 Hemangioma of skin and subcutaneous tissue: Secondary | ICD-10-CM | POA: Diagnosis not present

## 2021-06-30 DIAGNOSIS — Z08 Encounter for follow-up examination after completed treatment for malignant neoplasm: Secondary | ICD-10-CM | POA: Diagnosis not present

## 2021-06-30 DIAGNOSIS — Z85828 Personal history of other malignant neoplasm of skin: Secondary | ICD-10-CM | POA: Diagnosis not present

## 2021-06-30 DIAGNOSIS — L821 Other seborrheic keratosis: Secondary | ICD-10-CM | POA: Diagnosis not present

## 2021-07-12 ENCOUNTER — Ambulatory Visit (INDEPENDENT_AMBULATORY_CARE_PROVIDER_SITE_OTHER): Payer: PPO | Admitting: *Deleted

## 2021-07-12 DIAGNOSIS — E538 Deficiency of other specified B group vitamins: Secondary | ICD-10-CM | POA: Diagnosis not present

## 2021-07-12 MED ORDER — CYANOCOBALAMIN 1000 MCG/ML IJ SOLN
1000.0000 ug | Freq: Once | INTRAMUSCULAR | Status: AC
  Administered 2021-07-12: 1000 ug via INTRAMUSCULAR

## 2021-07-12 NOTE — Progress Notes (Signed)
Per orders of Dr. Fry, injection of Cyanocobalamin 1000mcg given by Abeeha Twist A. Patient tolerated injection well.  

## 2021-08-09 ENCOUNTER — Telehealth: Payer: Self-pay | Admitting: Family Medicine

## 2021-08-09 NOTE — Telephone Encounter (Signed)
Patient stopped by to see if he could stop taking the doxycycline (VIBRAMYCIN) 100 MG capsule . Patient states that it is upsetting his stomach.      Please advise

## 2021-08-09 NOTE — Telephone Encounter (Signed)
Patient wanted it added that it is off and on Diarrhea

## 2021-08-09 NOTE — Telephone Encounter (Signed)
Please advise 

## 2021-08-10 ENCOUNTER — Other Ambulatory Visit: Payer: Self-pay

## 2021-08-10 ENCOUNTER — Ambulatory Visit (INDEPENDENT_AMBULATORY_CARE_PROVIDER_SITE_OTHER): Payer: PPO

## 2021-08-10 DIAGNOSIS — N5089 Other specified disorders of the male genital organs: Secondary | ICD-10-CM

## 2021-08-10 DIAGNOSIS — E538 Deficiency of other specified B group vitamins: Secondary | ICD-10-CM

## 2021-08-10 DIAGNOSIS — L719 Rosacea, unspecified: Secondary | ICD-10-CM

## 2021-08-10 MED ORDER — CYANOCOBALAMIN 1000 MCG/ML IJ SOLN
1000.0000 ug | Freq: Once | INTRAMUSCULAR | Status: AC
  Administered 2021-08-10: 1000 ug via INTRAMUSCULAR

## 2021-08-10 NOTE — Progress Notes (Signed)
Pt here for monthly B12 injection per Dr Fry.  B12 1000mcg given IM, and pt tolerated injection well.  Next B12 injection scheduled for next month.  

## 2021-08-10 NOTE — Telephone Encounter (Signed)
Spoke with patient message given.   Medication discontinued.

## 2021-08-10 NOTE — Telephone Encounter (Signed)
Yes please DC this medication

## 2021-08-12 ENCOUNTER — Ambulatory Visit: Payer: PPO

## 2021-08-17 ENCOUNTER — Encounter: Payer: Self-pay | Admitting: Family Medicine

## 2021-08-17 ENCOUNTER — Ambulatory Visit (INDEPENDENT_AMBULATORY_CARE_PROVIDER_SITE_OTHER): Payer: PPO | Admitting: Family Medicine

## 2021-08-17 VITALS — BP 120/70 | HR 74 | Temp 97.8°F | Wt 232.0 lb

## 2021-08-17 DIAGNOSIS — R197 Diarrhea, unspecified: Secondary | ICD-10-CM

## 2021-08-17 MED ORDER — DIPHENOXYLATE-ATROPINE 2.5-0.025 MG PO TABS: 1.0000 | ORAL_TABLET | Freq: Three times a day (TID) | ORAL | 2 refills | Status: DC

## 2021-08-17 NOTE — Progress Notes (Signed)
   Subjective:    Patient ID: Matthew House, male    DOB: [birth year], 81 y.o.   MRN: [id number]  HPI Here for 6 months of frequent diarrhea. There is no pain or fever. His weight is stable. No dark or bloody stools. He has tried taking Lactaid and eliminating dairy from his diet, but this has not helped. He had a normal colonoscopy in 2011.    Review of Systems  Constitutional: Negative.   Respiratory: Negative.    Cardiovascular: Negative.   Gastrointestinal:  Positive for diarrhea. Negative for abdominal distention, abdominal pain, anal bleeding, blood in stool, constipation, nausea, rectal pain and vomiting.       Objective:   Physical Exam Constitutional:      Appearance: Normal appearance.  Cardiovascular:     Rate and Rhythm: Normal rate and regular rhythm.     Pulses: Normal pulses.     Heart sounds: Normal heart sounds.  Pulmonary:     Effort: Pulmonary effort is normal.     Breath sounds: Normal breath sounds.  Abdominal:     General: Abdomen is flat. Bowel sounds are normal. There is no distension.     Palpations: Abdomen is soft. There is no mass.     Tenderness: There is no abdominal tenderness. There is no guarding or rebound.     Hernia: No hernia is present.  Neurological:     Mental Status: He is alert.           Assessment & Plan:  Diarrhea, he will try taking Lomotil one tablet TID. Report back in 2 weeks.  Alysia Penna, MD

## 2021-09-07 ENCOUNTER — Ambulatory Visit (INDEPENDENT_AMBULATORY_CARE_PROVIDER_SITE_OTHER): Payer: PPO

## 2021-09-07 DIAGNOSIS — E538 Deficiency of other specified B group vitamins: Secondary | ICD-10-CM

## 2021-09-07 MED ORDER — CYANOCOBALAMIN 1000 MCG/ML IJ SOLN
1000.0000 ug | Freq: Once | INTRAMUSCULAR | Status: AC
  Administered 2021-09-07: 1000 ug via INTRAMUSCULAR

## 2021-09-07 NOTE — Progress Notes (Signed)
Pt here for monthly B12 injection per Dr Fry.  B12 1000mcg given IM, and pt tolerated injection well.  Next B12 injection scheduled for next month.  

## 2021-09-10 ENCOUNTER — Ambulatory Visit: Payer: PPO

## 2021-10-07 ENCOUNTER — Ambulatory Visit (INDEPENDENT_AMBULATORY_CARE_PROVIDER_SITE_OTHER): Payer: PPO | Admitting: *Deleted

## 2021-10-07 DIAGNOSIS — E538 Deficiency of other specified B group vitamins: Secondary | ICD-10-CM | POA: Diagnosis not present

## 2021-10-07 MED ORDER — CYANOCOBALAMIN 1000 MCG/ML IJ SOLN
1000.0000 ug | Freq: Once | INTRAMUSCULAR | Status: AC
  Administered 2021-10-07: 1000 ug via INTRAMUSCULAR

## 2021-10-07 NOTE — Progress Notes (Signed)
Per orders of Dr. Fry, injection of B12 given by Maximillion Gill. Patient tolerated injection well. 

## 2021-10-26 ENCOUNTER — Telehealth: Payer: Self-pay

## 2021-10-26 DIAGNOSIS — M9905 Segmental and somatic dysfunction of pelvic region: Secondary | ICD-10-CM | POA: Diagnosis not present

## 2021-10-26 DIAGNOSIS — M25561 Pain in right knee: Secondary | ICD-10-CM | POA: Diagnosis not present

## 2021-10-26 DIAGNOSIS — M545 Low back pain, unspecified: Secondary | ICD-10-CM | POA: Diagnosis not present

## 2021-10-26 DIAGNOSIS — M9904 Segmental and somatic dysfunction of sacral region: Secondary | ICD-10-CM | POA: Diagnosis not present

## 2021-10-26 DIAGNOSIS — M9906 Segmental and somatic dysfunction of lower extremity: Secondary | ICD-10-CM | POA: Diagnosis not present

## 2021-10-26 DIAGNOSIS — M9903 Segmental and somatic dysfunction of lumbar region: Secondary | ICD-10-CM | POA: Diagnosis not present

## 2021-10-26 NOTE — Telephone Encounter (Signed)
Caller states he has been having night sweats the last few days, started a new medication a couple of months ago for an overactive digestive system wondering if it is from that.  10/26/2021 10:55:42 AM See PCP within 24 Hours Bringas, RN, Danica  Comments User: Harriette Bouillon, RN Date/Time Eilene Ghazi Time): 10/26/2021 10:52:08 AM has a hernia as well  Referrals REFERRED TO PCP OFFICE  Pt has appt with PCP on 10/27/21

## 2021-10-27 ENCOUNTER — Encounter: Payer: Self-pay | Admitting: Family Medicine

## 2021-10-27 ENCOUNTER — Ambulatory Visit (INDEPENDENT_AMBULATORY_CARE_PROVIDER_SITE_OTHER): Payer: PPO | Admitting: Family Medicine

## 2021-10-27 VITALS — BP 136/70 | HR 72 | Temp 98.3°F | Wt 230.0 lb

## 2021-10-27 DIAGNOSIS — R509 Fever, unspecified: Secondary | ICD-10-CM | POA: Diagnosis not present

## 2021-10-27 DIAGNOSIS — E538 Deficiency of other specified B group vitamins: Secondary | ICD-10-CM

## 2021-10-27 DIAGNOSIS — H1031 Unspecified acute conjunctivitis, right eye: Secondary | ICD-10-CM

## 2021-10-27 DIAGNOSIS — C61 Malignant neoplasm of prostate: Secondary | ICD-10-CM

## 2021-10-27 LAB — PSA: PSA: 0 ng/mL — ABNORMAL LOW (ref 0.10–4.00)

## 2021-10-27 LAB — CBC WITH DIFFERENTIAL/PLATELET
Basophils Absolute: 0 10*3/uL (ref 0.0–0.1)
Basophils Relative: 0.5 % (ref 0.0–3.0)
Eosinophils Absolute: 0.1 10*3/uL (ref 0.0–0.7)
Eosinophils Relative: 1.5 % (ref 0.0–5.0)
HCT: 36.9 % — ABNORMAL LOW (ref 39.0–52.0)
Hemoglobin: 12.7 g/dL — ABNORMAL LOW (ref 13.0–17.0)
Lymphocytes Relative: 25 % (ref 12.0–46.0)
Lymphs Abs: 1.9 10*3/uL (ref 0.7–4.0)
MCHC: 34.4 g/dL (ref 30.0–36.0)
MCV: 85.9 fl (ref 78.0–100.0)
Monocytes Absolute: 0.8 10*3/uL (ref 0.1–1.0)
Monocytes Relative: 10.7 % (ref 3.0–12.0)
Neutro Abs: 4.6 10*3/uL (ref 1.4–7.7)
Neutrophils Relative %: 62.3 % (ref 43.0–77.0)
Platelets: 176 10*3/uL (ref 150.0–400.0)
RBC: 4.29 Mil/uL (ref 4.22–5.81)
RDW: 14.4 % (ref 11.5–15.5)
WBC: 7.5 10*3/uL (ref 4.0–10.5)

## 2021-10-27 LAB — BASIC METABOLIC PANEL
BUN: 14 mg/dL (ref 6–23)
CO2: 29 mEq/L (ref 19–32)
Calcium: 9.7 mg/dL (ref 8.4–10.5)
Chloride: 99 mEq/L (ref 96–112)
Creatinine, Ser: 0.8 mg/dL (ref 0.40–1.50)
GFR: 83.68 mL/min (ref >60.00)
Glucose, Bld: 98 mg/dL (ref 70–99)
Potassium: 4 mEq/L (ref 3.5–5.1)
Sodium: 134 mEq/L — ABNORMAL LOW (ref 135–145)

## 2021-10-27 LAB — URINALYSIS
Bilirubin Urine: NEGATIVE
Hgb urine dipstick: NEGATIVE
Ketones, ur: NEGATIVE
Leukocytes,Ua: NEGATIVE
Nitrite: NEGATIVE
Specific Gravity, Urine: <=1.005 — AB (ref 1.000–1.030)
Total Protein, Urine: NEGATIVE
Urine Glucose: NEGATIVE
Urobilinogen, UA: 0.2 (ref 0.0–1.0)
pH: 6 (ref 5.0–8.0)

## 2021-10-27 LAB — HEPATIC FUNCTION PANEL
ALT: 24 U/L (ref 0–53)
AST: 21 U/L (ref 0–37)
Albumin: 4.9 g/dL (ref 3.5–5.2)
Alkaline Phosphatase: 68 U/L (ref 39–117)
Bilirubin, Direct: 0.2 mg/dL (ref 0.0–0.3)
Total Bilirubin: 1.1 mg/dL (ref 0.2–1.2)
Total Protein: 7.5 g/dL (ref 6.0–8.3)

## 2021-10-27 LAB — VITAMIN B12: Vitamin B-12: 604 pg/mL (ref 211–911)

## 2021-10-27 LAB — TSH: TSH: 1.81 u[IU]/mL (ref 0.35–5.50)

## 2021-10-27 MED ORDER — TOBRAMYCIN-DEXAMETHASONE 0.3-0.1 % OP SUSP: 2.0000 [drp] | OPHTHALMIC | 0 refills | Status: DC

## 2021-10-27 NOTE — Progress Notes (Signed)
   Subjective:    Patient ID: Matthew House, male    DOB: 1940/12/17, 81 y.o.   MRN: [id number]  HPI Here for several issues. First about 2 weeks ago he began to experience "hot flashes" in the middle of the night. He does not sweat, but he wakes up from sleep and he feels very hot. Then 3 days ago he developed some sore bumps on the lips, both inside and outside. And then 2 days ago he developed redness in the right eye with burning and swollen eye lids. No vision changes. He denies ST, cough, headaches, NVD, or any other symptoms. Also he asks if we can take over checking his PSA level. He had been seeing his urologist and all he was doing was checking the PSA yearly. His prostatectomy was in March 2020.    Review of Systems  Constitutional:  Positive for fever.  HENT:  Positive for mouth sores. Negative for congestion, ear pain, postnasal drip, sinus pressure and sore throat.   Eyes:  Positive for redness.  Respiratory: Negative.    Cardiovascular: Negative.   Gastrointestinal: Negative.        Objective:   Physical Exam Constitutional:      Appearance: Normal appearance. He is not ill-appearing.  HENT:     Right Ear: Tympanic membrane, ear canal and external ear normal.     Left Ear: Tympanic membrane, ear canal and external ear normal.     Nose: Nose normal.     Mouth/Throat:     Pharynx: Oropharynx is clear.  Eyes:     Comments: Right conjunctiva is red, cornea is clear, no photophobia. The left eye is clear   Pulmonary:     Effort: Pulmonary effort is normal.     Breath sounds: Normal breath sounds.  Neurological:     Mental Status: He is alert.           Assessment & Plan:  He has been having nighttime fevers, and now he has an early conjunctivitis and possibly a fever blister outbreak. He seems to be fighting some sort of a viral illness. He can drink fluids and take Tylenol. Treat the conjunctivitis with Tobradex drops. We will check labs to rule out other  potential etiologies including any thyroid dysfunction. Check a PSA.  Alysia Penna, MD

## 2021-11-03 DIAGNOSIS — H25813 Combined forms of age-related cataract, bilateral: Secondary | ICD-10-CM | POA: Diagnosis not present

## 2021-11-08 ENCOUNTER — Ambulatory Visit (INDEPENDENT_AMBULATORY_CARE_PROVIDER_SITE_OTHER): Payer: PPO

## 2021-11-08 ENCOUNTER — Telehealth: Payer: Self-pay

## 2021-11-08 DIAGNOSIS — E538 Deficiency of other specified B group vitamins: Secondary | ICD-10-CM

## 2021-11-08 MED ORDER — CYANOCOBALAMIN 1000 MCG/ML IJ SOLN
1000.0000 ug | INTRAMUSCULAR | Status: AC
  Administered 2021-11-08 – 2022-08-03 (×2): 1000 ug via INTRAMUSCULAR

## 2021-11-08 NOTE — Telephone Encounter (Signed)
On 10/27/21 B12 604. Verbal order received from PCP for pt to continue B12 injections every 3 weeks (per pt request).

## 2021-11-08 NOTE — Progress Notes (Signed)
Pt here for monthly B12 injection per Dr Sarajane Jews.  B12 1040mg given IM left deltoid and pt tolerated injection well.  Next B12 injection scheduled for 12/06/21 during OV with Dr FSarajane Jews

## 2021-11-14 ENCOUNTER — Other Ambulatory Visit: Payer: Self-pay | Admitting: Family Medicine

## 2021-11-22 ENCOUNTER — Emergency Department (HOSPITAL_BASED_OUTPATIENT_CLINIC_OR_DEPARTMENT_OTHER): Payer: PPO

## 2021-11-22 ENCOUNTER — Emergency Department (HOSPITAL_BASED_OUTPATIENT_CLINIC_OR_DEPARTMENT_OTHER)
Admission: EM | Admit: 2021-11-22 | Discharge: 2021-11-22 | Disposition: A | Discharge status: Home / Self Care | Payer: PPO | Attending: Emergency Medicine | Admitting: Emergency Medicine

## 2021-11-22 ENCOUNTER — Other Ambulatory Visit: Payer: Self-pay

## 2021-11-22 ENCOUNTER — Encounter (HOSPITAL_BASED_OUTPATIENT_CLINIC_OR_DEPARTMENT_OTHER): Payer: Self-pay | Admitting: Emergency Medicine

## 2021-11-22 ENCOUNTER — Emergency Department (HOSPITAL_BASED_OUTPATIENT_CLINIC_OR_DEPARTMENT_OTHER): Payer: PPO | Admitting: Radiology

## 2021-11-22 DIAGNOSIS — K921 Melena: Secondary | ICD-10-CM | POA: Diagnosis not present

## 2021-11-22 DIAGNOSIS — R0609 Other forms of dyspnea: Secondary | ICD-10-CM | POA: Insufficient documentation

## 2021-11-22 DIAGNOSIS — R0602 Shortness of breath: Secondary | ICD-10-CM | POA: Diagnosis not present

## 2021-11-22 DIAGNOSIS — R0789 Other chest pain: Secondary | ICD-10-CM | POA: Diagnosis not present

## 2021-11-22 LAB — CBC WITH DIFFERENTIAL/PLATELET
Abs Immature Granulocytes: 0.03 10*3/uL (ref 0.00–0.07)
Basophils Absolute: 0 10*3/uL (ref 0.0–0.1)
Basophils Relative: 1 %
Eosinophils Absolute: 0.1 10*3/uL (ref 0.0–0.5)
Eosinophils Relative: 2 %
HCT: 34.6 % — ABNORMAL LOW (ref 39.0–52.0)
Hemoglobin: 12.1 g/dL — ABNORMAL LOW (ref 13.0–17.0)
Immature Granulocytes: 0 %
Lymphocytes Relative: 24 %
Lymphs Abs: 1.7 10*3/uL (ref 0.7–4.0)
MCH: 29.4 pg (ref 26.0–34.0)
MCHC: 35 g/dL (ref 30.0–36.0)
MCV: 84.2 fL (ref 80.0–100.0)
Monocytes Absolute: 0.6 10*3/uL (ref 0.1–1.0)
Monocytes Relative: 8 %
Neutro Abs: 4.4 10*3/uL (ref 1.7–7.7)
Neutrophils Relative %: 65 %
Platelets: 174 10*3/uL (ref 150–400)
RBC: 4.11 MIL/uL — ABNORMAL LOW (ref 4.22–5.81)
RDW: 14.3 % (ref 11.5–15.5)
WBC: 6.8 10*3/uL (ref 4.0–10.5)
nRBC: 0 % (ref 0.0–0.2)

## 2021-11-22 LAB — COMPREHENSIVE METABOLIC PANEL
ALT: 21 U/L (ref 0–44)
AST: 18 U/L (ref 15–41)
Albumin: 4.9 g/dL (ref 3.5–5.0)
Alkaline Phosphatase: 60 U/L (ref 38–126)
Anion gap: 12 (ref 5–15)
BUN: 14 mg/dL (ref 8–23)
CO2: 24 mmol/L (ref 22–32)
Calcium: 10.1 mg/dL (ref 8.9–10.3)
Chloride: 102 mmol/L (ref 98–111)
Creatinine, Ser: 0.83 mg/dL (ref 0.61–1.24)
GFR, Estimated: >60 mL/min (ref >60)
Glucose, Bld: 110 mg/dL — ABNORMAL HIGH (ref 70–99)
Potassium: 3.8 mmol/L (ref 3.5–5.1)
Sodium: 138 mmol/L (ref 135–145)
Total Bilirubin: 1.1 mg/dL (ref 0.3–1.2)
Total Protein: 7.6 g/dL (ref 6.5–8.1)

## 2021-11-22 LAB — PROTIME-INR
INR: 1.1 (ref 0.8–1.2)
Prothrombin Time: 13.9 seconds (ref 11.4–15.2)

## 2021-11-22 LAB — APTT: aPTT: 30 seconds (ref 24–36)

## 2021-11-22 LAB — TROPONIN I (HIGH SENSITIVITY)
Troponin I (High Sensitivity): 5 ng/L (ref <18)
Troponin I (High Sensitivity): 4 ng/L (ref <18)

## 2021-11-22 LAB — OCCULT BLOOD X 1 CARD TO LAB, STOOL: Fecal Occult Bld: NEGATIVE

## 2021-11-22 LAB — BRAIN NATRIURETIC PEPTIDE: B Natriuretic Peptide: 30.1 pg/mL (ref 0.0–100.0)

## 2021-11-22 MED ORDER — LABETALOL HCL 5 MG/ML IV SOLN
10.0000 mg | Freq: Once | INTRAVENOUS | Status: AC
  Administered 2021-11-22: 10 mg via INTRAVENOUS
  Filled 2021-11-22: qty 4

## 2021-11-22 MED ORDER — IOHEXOL 350 MG/ML SOLN
100.0000 mL | Freq: Once | INTRAVENOUS | Status: AC | PRN
  Administered 2021-11-22: 100 mL via INTRAVENOUS

## 2021-11-22 NOTE — Discharge Instructions (Signed)
Today we found that you you did not have any blood in your stool.  If you start feeling weak, pale, or or having worsening black stools please return to the ER.  I recommend that you have a colonoscopy please speak to your primary care doctor about this.  Additionally you are having shortness of breath on exertion, I recommend that you have a stress test or a calcium test to evaluate for blockages.  Please follow-up with your primary care with this.  If you start having worsening shortness of breath on exertion chest pain, nausea, vomiting please return to the ER.

## 2021-11-22 NOTE — ED Provider Notes (Signed)
[facility] EMERGENCY DEPT Provider Note   CSN: [id number] Arrival date & time: 11/22/21  5093     History  Chief Complaint  Patient presents with   Weakness   Shortness of Breath    Matthew House is a 81 y.o. male, hx of esophagitis, who presents to the ED 2/2 to SOB on exertion since 11/17 that has been progressively getting worse. Reports that he saw a doctor 2 weeks ago and was told to start iron d/t low iron. Reports soft black stool for the last 1 month---was occurring prior to starting iron. Lethargy x1 month. Reports black stool in May that resolved. Was supposed to get colonoscopy but canceled it.      Home Medications Prior to Admission medications   Medication Sig Start Date End Date Taking? Authorizing Provider  cetirizine (ZYRTEC) 10 MG tablet Take 10 mg by mouth daily.    [provider]  cyanocobalamin (,VITAMIN B-12,) 1000 MCG/ML injection Inject 1 mL (1,000 mcg total) into the muscle every 30 (thirty) days. 12/03/20   [name], MD  diphenoxylate-atropine (LOMOTIL) 2.5-0.025 MG tablet Take 1 tablet by mouth 3 (three) times daily. Patient taking differently: Take 1 tablet by mouth daily. 08/17/21   [name], MD  famotidine (PEPCID) 40 MG tablet TAKE ONE TABLET BY MOUTH TWICE DAILY 11/15/21   [name], MD  tobramycin-dexamethasone [facility]) ophthalmic solution Place 2 drops into the right eye every 4 (four) hours while awake. 10/27/21   [name], MD      Allergies    Shingrix [zoster vac recomb adjuvanted]    Review of Systems   Review of Systems  Constitutional:  Positive for fever. Negative for chills.  Respiratory:  Positive for shortness of breath.   Gastrointestinal:  Negative for abdominal pain.  Neurological:  Positive for weakness.    Physical Exam Updated Vital Signs BP (!) 148/68   Pulse 65   Temp 98.4 F (36.9 C) (Oral)   Resp 17   Ht '5\' 11"'$  (1.803 m)   Wt 104.3 kg   SpO2 100%   BMI 32.08  kg/m  Physical Exam Vitals and nursing note reviewed.  Constitutional:      General: He is not in acute distress.    Appearance: He is well-developed.  HENT:     Head: Normocephalic and atraumatic.  Eyes:     Conjunctiva/sclera: Conjunctivae normal.  Cardiovascular:     Rate and Rhythm: Normal rate and regular rhythm.     Heart sounds: No murmur heard. Pulmonary:     Effort: Pulmonary effort is normal. No respiratory distress.     Breath sounds: Normal breath sounds.  Abdominal:     Palpations: Abdomen is soft.     Tenderness: There is no abdominal tenderness.  Musculoskeletal:        General: No swelling. Normal range of motion.     Cervical back: Neck supple.     Right lower leg: No edema.     Left lower leg: No edema.  Skin:    General: Skin is warm and dry.     Capillary Refill: Capillary refill takes less than 2 seconds.  Neurological:     Mental Status: He is alert.  Psychiatric:        Mood and Affect: Mood normal.     ED Results / Procedures / Treatments   Labs (all labs ordered are listed, but only abnormal results are displayed) Labs Reviewed  CBC WITH  DIFFERENTIAL/PLATELET - Abnormal; Notable for the following components:      Result Value   RBC 4.11 (*)    Hemoglobin 12.1 (*)    HCT 34.6 (*)    All other components within normal limits  COMPREHENSIVE METABOLIC PANEL - Abnormal; Notable for the following components:   Glucose, Bld 110 (*)    All other components within normal limits  OCCULT BLOOD X 1 CARD TO LAB, STOOL  PROTIME-INR  APTT  BRAIN NATRIURETIC PEPTIDE  TROPONIN I (HIGH SENSITIVITY)  TROPONIN I (HIGH SENSITIVITY)    EKG EKG Interpretation  Date/Time:  Monday November 22 2021 09:44:35 EST Ventricular Rate:  79 PR Interval:  243 QRS Duration: 106 QT Interval:  365 QTC Calculation: 419 R Axis:   -30 Text Interpretation: Sinus rhythm Prolonged PR interval Left axis deviation Abnormal R-wave progression, early transition No  significant change since last tracing Confirmed by Gareth Morgan [phone]) on 11/22/2021 10:32:35 AM  Radiology CT Angio Chest PE W and/or Wo Contrast  Result Date: 11/22/2021 CLINICAL DATA:  Shortness of breath. EXAM: CT ANGIOGRAPHY CHEST WITH CONTRAST TECHNIQUE: Multidetector CT imaging of the chest was performed using the standard protocol during bolus administration of intravenous contrast. Multiplanar CT image reconstructions and MIPs were obtained to evaluate the vascular anatomy. RADIATION DOSE REDUCTION: This exam was performed according to the departmental dose-optimization program which includes automated exposure control, adjustment of the mA and/or kV according to patient size and/or use of iterative reconstruction technique. CONTRAST:  117m OMNIPAQUE IOHEXOL 350 MG/ML SOLN COMPARISON:  None Available. FINDINGS: Cardiovascular: Satisfactory opacification of the pulmonary arteries to the segmental level. No evidence of pulmonary embolism. Normal heart size. No pericardial effusion. Mediastinum/Nodes: No enlarged mediastinal, hilar, or axillary lymph nodes. Thyroid gland, trachea, and esophagus demonstrate no significant findings. Lungs/Pleura: Lungs are clear. No pleural effusion or pneumothorax. Upper Abdomen: No acute abnormality. Musculoskeletal: No chest wall abnormality. No acute or significant osseous findings. Review of the MIP images confirms the above findings. IMPRESSION: No definite evidence of pulmonary embolus. No acute abnormality seen in the chest. Electronically Signed   By: JMarijo ConceptionM.D.   On: 11/22/2021 13:13   DG Chest 2 View  Result Date: 11/22/2021 CLINICAL DATA:  Shortness of breath EXAM: CHEST - 2 VIEW COMPARISON:  Chest x-ray dated April 11, 2016 FINDINGS: The heart size and mediastinal contours are within normal limits. Nodular opacities of the bilateral lower lungs, compatible with nipple shadows. Both lungs are clear. The visualized skeletal structures are  unremarkable. IMPRESSION: No active cardiopulmonary disease. Electronically Signed   By: LYetta GlassmanM.D.   On: 11/22/2021 10:52    Procedures Procedures   Medications Ordered in ED Medications  labetalol (NORMODYNE) injection 10 mg (10 mg Intravenous Given 11/22/21 1100)  iohexol (OMNIPAQUE) 350 MG/ML injection 100 mL (100 mLs Intravenous Contrast Given 11/22/21 1251)    ED Course/ Medical Decision Making/ A&P                           Medical Decision Making Patient is an 81year old male, here for shortness of breath on exertion, and a black stools.  Hemoccult will be performed given concern for GI bleed.  And will obtain chest x-ray, basic blood work, and evaluate heart given age, risk factors.  Amount and/or Complexity of Data Reviewed Labs: ordered.    Details: Hemoglobin stable at 12, and negative Hemoccult. Radiology: ordered.    Details: Chest x-ray and  CTA unremarkable. ECG/medicine tests:  Decision-making details documented in ED Course. Discussion of management or test interpretation with external provider(s): Patient is moderate risk, heart score 5.  Discussed with patient, his dyspnea on exertion, he voiced understanding, and will follow-up with his primary care doctor.  Return precautions were emphasized.  The importance of following up and getting colonoscopy given dark stools, and obtaining stress test given risk factors, and dyspnea on exertion.  Patient voiced understanding.  Risk Prescription drug management.    Final Clinical Impression(s) / ED Diagnoses Final diagnoses:  Black stool  DOE (dyspnea on exertion)    Rx / DC Orders ED Discharge Orders     None         Eddy Liszewski, Si Gaul, PA 11/22/21 1557    Gareth Morgan, MD 11/23/21 0011

## 2021-11-22 NOTE — ED Triage Notes (Signed)
Pt arrives to ED with c/o SOB, weakness, dark stools, and dizziness, lightheadedness. The SOB occurs on exertion (walking fast, walking up stairs). He notes episodes on Friday and Saturday night. Hx of low iron.

## 2021-11-30 ENCOUNTER — Encounter: Payer: Self-pay | Admitting: Family Medicine

## 2021-11-30 ENCOUNTER — Ambulatory Visit (INDEPENDENT_AMBULATORY_CARE_PROVIDER_SITE_OTHER): Payer: PPO | Admitting: Family Medicine

## 2021-11-30 VITALS — BP 128/64 | HR 68 | Temp 97.9°F | Ht 71.0 in | Wt 230.0 lb

## 2021-11-30 DIAGNOSIS — R0602 Shortness of breath: Secondary | ICD-10-CM

## 2021-11-30 DIAGNOSIS — E785 Hyperlipidemia, unspecified: Secondary | ICD-10-CM

## 2021-11-30 DIAGNOSIS — C61 Malignant neoplasm of prostate: Secondary | ICD-10-CM

## 2021-11-30 NOTE — Progress Notes (Signed)
   Subjective:    Patient ID: Matthew House, male    DOB: December 12, 1940, 81 y.o.   MRN: [id number]  HPI Here to follow up an ED visit on 11-22-21 for SOB on exertion. Normally this is not a problem but the day before the ED visit he began to notice that he felt SOB with minimal exertion (like waking up a flight of steps), and by the next day this had gotten worse. He had no SOB at rest. No chest pain or palpitations. At the ED his exam and EKG were normal. A CXR and a chest CT angiogram were normal. Labs were unremarkable (BNP was 30, creatinine was 0.83, and Hgb was 12.1). As stool test for fecal blood was negative. He was sent home to follow up with [place]. He says the SOB has improved since then, but he is still not feeling the way he should. He has never had a cardiac stress test.    Review of Systems  Constitutional: Negative.   Respiratory:  Positive for shortness of breath. Negative for cough and wheezing.   Cardiovascular: Negative.   Gastrointestinal: Negative.   Genitourinary: Negative.   Neurological: Negative.        Objective:   Physical Exam Constitutional:      Appearance: Normal appearance. He is not ill-appearing.  Cardiovascular:     Rate and Rhythm: Normal rate and regular rhythm.     Pulses: Normal pulses.     Heart sounds: Normal heart sounds.  Pulmonary:     Effort: Pulmonary effort is normal.     Breath sounds: Normal breath sounds.  Neurological:     General: No focal deficit present.     Mental Status: He is alert and oriented to person, place, and time.           Assessment & Plan:  SOB on exertion. I am still concerned about the possibility of CAD. We will refer him to Cardiology for further evaluation. We spent a total of ( 35  ) minutes reviewing records and discussing these issues.  Alysia Penna, MD

## 2021-12-01 ENCOUNTER — Other Ambulatory Visit (INDEPENDENT_AMBULATORY_CARE_PROVIDER_SITE_OTHER): Payer: PPO

## 2021-12-01 ENCOUNTER — Telehealth: Payer: Self-pay | Admitting: Family Medicine

## 2021-12-01 ENCOUNTER — Telehealth: Payer: Self-pay

## 2021-12-01 DIAGNOSIS — E785 Hyperlipidemia, unspecified: Secondary | ICD-10-CM | POA: Diagnosis not present

## 2021-12-01 DIAGNOSIS — C61 Malignant neoplasm of prostate: Secondary | ICD-10-CM | POA: Diagnosis not present

## 2021-12-01 LAB — LIPID PANEL
Cholesterol: 156 mg/dL (ref 0–200)
HDL: 24 mg/dL — ABNORMAL LOW (ref >39.00)
NonHDL: 131.54
Total CHOL/HDL Ratio: 6
Triglycerides: 334 mg/dL — ABNORMAL HIGH (ref 0.0–149.0)
VLDL: 66.8 mg/dL — ABNORMAL HIGH (ref 0.0–40.0)

## 2021-12-01 LAB — PSA: PSA: 0 ng/mL — ABNORMAL LOW (ref 0.10–4.00)

## 2021-12-01 LAB — TSH: TSH: 4.54 u[IU]/mL (ref 0.35–5.50)

## 2021-12-01 LAB — LDL CHOLESTEROL, DIRECT: Direct LDL: 90 mg/dL

## 2021-12-01 NOTE — Telephone Encounter (Signed)
Patient wanted to know is he still needs to continue with the OTC iron pills or not. Patient states that during appointment with pcp he believes he was told that his iron levels were nearing or at where they needed to be, since starting the iron.       Please advise

## 2021-12-01 NOTE — Telephone Encounter (Signed)
        Patient  visited [facility] on 11/20   Telephone encounter attempt : 1st   A HIPAA compliant voice message was left requesting a return call.  Instructed patient to call back    [place], [facility]  [phone] [address], [place], [place] [address] Phone: [phone] Email: [email or web address]

## 2021-12-01 NOTE — Telephone Encounter (Signed)
Spoke with pt advised to continue taking his Iron medication as prescribed, pt verbalized understanding

## 2021-12-02 ENCOUNTER — Telehealth: Payer: Self-pay

## 2021-12-02 ENCOUNTER — Other Ambulatory Visit (HOSPITAL_COMMUNITY): Payer: Self-pay

## 2021-12-02 MED ORDER — DUROLANE 60 MG/3ML IX PRSY
PREFILLED_SYRINGE | INTRA_ARTICULAR | 0 refills | Status: AC
  Filled 2021-12-02: qty 3, 180d supply, fill #0

## 2021-12-02 NOTE — Telephone Encounter (Signed)
     Patient  visit on 11/20  at [place]  Have you been able to follow up with your primary care physician? Yes   The patient was or was not able to obtain any needed medicine or equipment. Yes   Are there diet recommendations that you are having difficulty following? Na   Patient expresses understanding of discharge instructions and education provided has no other needs at this time. Yes    [place], [facility], Care Management  [phone] [address], [place], [place] [address] Phone: [phone] Email: [email or web address]

## 2021-12-04 ENCOUNTER — Other Ambulatory Visit (HOSPITAL_COMMUNITY): Payer: Self-pay

## 2021-12-06 ENCOUNTER — Encounter: Payer: PPO | Admitting: Family Medicine

## 2021-12-06 ENCOUNTER — Other Ambulatory Visit (HOSPITAL_COMMUNITY): Payer: Self-pay

## 2021-12-07 ENCOUNTER — Ambulatory Visit (INDEPENDENT_AMBULATORY_CARE_PROVIDER_SITE_OTHER): Payer: PPO

## 2021-12-07 DIAGNOSIS — E538 Deficiency of other specified B group vitamins: Secondary | ICD-10-CM | POA: Diagnosis not present

## 2021-12-07 MED ORDER — CYANOCOBALAMIN 1000 MCG/ML IJ SOLN
1000.0000 ug | Freq: Once | INTRAMUSCULAR | Status: AC
  Administered 2021-12-07: 1000 ug via INTRAMUSCULAR

## 2021-12-07 NOTE — Progress Notes (Signed)
Per orders of Dr. Fry, injection of Cyanocobalamin Inj 1000 mcg given by Azarias Chiou on Left Deltoid.  Patient tolerated injection well.  

## 2021-12-09 ENCOUNTER — Encounter: Payer: Self-pay | Admitting: Cardiovascular Disease

## 2021-12-09 ENCOUNTER — Ambulatory Visit: Payer: PPO | Attending: Cardiovascular Disease | Admitting: Cardiovascular Disease

## 2021-12-09 VITALS — BP 138/68 | HR 67 | Ht 70.5 in | Wt 236.0 lb

## 2021-12-09 DIAGNOSIS — R0602 Shortness of breath: Secondary | ICD-10-CM

## 2021-12-09 DIAGNOSIS — E785 Hyperlipidemia, unspecified: Secondary | ICD-10-CM | POA: Diagnosis not present

## 2021-12-09 DIAGNOSIS — I440 Atrioventricular block, first degree: Secondary | ICD-10-CM

## 2021-12-09 NOTE — Progress Notes (Signed)
Cardiology Office Note:    Date:  12/09/2021   ID:  TOBYN OSGOOD, DOB 1940/12/22, MRN [id number]  PCP:  [name], MD   [facility] Cardiologist:  Sanda Klein, MD     Referring MD: [name], MD   No chief complaint on file. Matthew House is a 81 y.o. male who is being seen today for the evaluation of dyspnea at the request of [name], MD.   History of Present Illness:    Matthew House is a 81 y.o. male with a hx of mixed hyperlipidemia, GERD, BPH, but no history of coronary or vascular disease who was seen after a couple of episodes of exertional dyspnea.  In late November he was rushing up the stairs while speaking on his cell phone and was quite dyspneic.  This happened 2 evenings in a row.  He did not have any chest pain, dizziness or near syncope or palpitations.  He subsequent to the emergency room for evaluation but the results were quite benign.  Cardiac enzymes are normal, including BNP of 30.  His ECG did not show ischemic changes.  His chest x-ray was normal.  Hemoglobin was borderline low at 12.1, but unchanged from past assessments.  Following these events he has been able to return his his usual schedule of going to the gym 3 days a week where he does upper body workouts with weights.  He appears to be quite strong.  He does not do aerobic exercise or walking due to knee problems.  He has not had any problems with shortness of breath while at the gym.  When he returns to his second story apartment he stops on the landing between the 2 flights of stairs briefly take prevent getting out of breath.  Looking back, he thinks that part of his issues may have been emotional.  He was struggling with the death of a close church friend.  He does not have diabetes mellitus or hypertension and has never smoked.  He does have dyslipidemia.  In the past his triglycerides were as high as 700, but with dietary changes they are down to 334 on labs  performed just about a week ago.  He has a chronically low HDL cholesterol that is only 24.  He took statins for several years but has not taken these recently.  His LDL cholesterol is 90, off medication.  His family's history is unknown since he was adopted.  Past Medical History:  Diagnosis Date   BPH (benign prostatic hyperplasia)    Cancer (HCC)    prostate cancer basal cell nose and forhead   GERD (gastroesophageal reflux disease)    Hyperlipidemia    Rosacea    S/P tonsillectomy and adenoidectomy     Past Surgical History:  Procedure Laterality Date   COLONOSCOPY  09/28/2010   per Dr. Olevia Perches, no polyps, repeat in 10 yrs    INCISE AND DRAIN ABCESS  2002   right 3rd finger   KNEE CARTILAGE SURGERY Right    LYMPHADENECTOMY Bilateral 03/14/2018   Procedure: LYMPHADENECTOMY;  Surgeon: Alexis Frock, MD;  Location: [facility];  Service: Urology;  Laterality: Bilateral;   ROBOT ASSISTED LAPAROSCOPIC RADICAL PROSTATECTOMY N/A 03/14/2018   Procedure: XI ROBOTIC ASSISTED LAPAROSCOPIC RADICAL PROSTATECTOMY;  Surgeon: Alexis Frock, MD;  Location: [facility];  Service: Urology;  Laterality: N/A;  3 HRS   TONSILLECTOMY AND ADENOIDECTOMY  1952   VASECTOMY  1992    Current Medications:  Current Meds  Medication Sig   cetirizine (ZYRTEC) 10 MG tablet Take 10 mg by mouth daily.   cyanocobalamin (,VITAMIN B-12,) 1000 MCG/ML injection Inject 1 mL (1,000 mcg total) into the muscle every 30 (thirty) days.   famotidine (PEPCID) 40 MG tablet TAKE ONE TABLET BY MOUTH TWICE DAILY   Ferrous Sulfate (IRON PO) Take 1 tablet by mouth in the morning.   Sodium Hyaluronate (DUROLANE) 60 MG/3ML PRSY intra-articular injection HCP to inject 3 mL, intra-articular once in the right knee   Current Facility-Administered Medications for the 12/09/21 encounter (Office Visit) with Milam Allbaugh, Dani Gobble, MD  Medication   cyanocobalamin (VITAMIN B12) injection 1,000 mcg     Allergies:   Shingrix [zoster vac recomb adjuvanted]    Social History   Socioeconomic History   Marital status: Married    Spouse name: Not on file   Number of children: 2   Years of education: 14   Highest education level: Not on file  Occupational History   Occupation: retired Engineer, structural  Tobacco Use   Smoking status: Former    Types: Cigarettes    Quit date: 01/03/1978    Years since quitting: 43.9   Smokeless tobacco: Never  Vaping Use   Vaping Use: Never used  Substance and Sexual Activity   Alcohol use: No    Alcohol/week: 0.0 standard drinks of alcohol   Drug use: No   Sexual activity: Yes  Other Topics Concern   Not on file  Social History Narrative   Lives with wife in a 2 story home.  Has 2 children.  Retired from Event organiser.  Education: some college.    Social Determinants of Health   Financial Resource Strain: Not on file  Food Insecurity: Not on file  Transportation Needs: Not on file  Physical Activity: Not on file  Stress: Not on file  Social Connections: Not on file     Family History: The patient's family history is unknown (adopted)  ROS:   Please see the history of present illness.     All other systems reviewed and are negative.  EKGs/Labs/Other Studies Reviewed:    The following studies were reviewed today: Notes, labs, chest x-ray, ECG from the emergency room visit 11/22/2021  EKG:  EKG is  ordered today.  The ekg ordered today demonstrates sinus rhythm with first-degree AV block (PR interval 248 ms), borderline left axis deviation, no ischemic repolarization abnormalities.  Recent Labs: 11/22/2021: ALT 21; B Natriuretic Peptide 30.1; BUN 14; Creatinine, Ser 0.83; Hemoglobin 12.1; Platelets 174; Potassium 3.8; Sodium 138 12/01/2021: TSH 4.54  Recent Lipid Panel    Component Value Date/Time   CHOL 156 12/01/2021 0851   TRIG 334.0 (H) 12/01/2021 0851   TRIG 247 (HH) 10/12/2005 1111   HDL 24.00 (L) 12/01/2021 0851   CHOLHDL 6 12/01/2021 0851   VLDL 66.8 (H) 12/01/2021 0851    LDLCALC 35 10/24/2019 0929   LDLDIRECT 90.0 12/01/2021 0851     Risk Assessment/Calculations:                Physical Exam:    VS:  BP 138/68 (BP Location: Left Arm, Patient Position: Sitting, Cuff Size: Large)   Pulse 67   Ht 5' 10.5" (1.791 m)   Wt 107 kg   SpO2 99%   BMI 33.38 kg/m     Wt Readings from Last 3 Encounters:  12/09/21 107 kg  11/30/21 104.3 kg  11/22/21 104.3 kg     GEN: Mildly obese, but  also appears very strong and fit, well nourished, well developed in no acute distress HEENT: Normal NECK: No JVD; No carotid bruits LYMPHATICS: No lymphadenopathy CARDIAC: RRR, no murmurs, rubs, gallops RESPIRATORY:  Clear to auscultation without rales, wheezing or rhonchi  ABDOMEN: Soft, non-tender, non-distended MUSCULOSKELETAL:  No edema; No deformity  SKIN: Warm and dry NEUROLOGIC:  Alert and oriented x 3 PSYCHIATRIC:  Normal affect   ASSESSMENT:    1. Shortness of breath   2. First degree AV block   3. Dyslipidemia    PLAN:    In order of problems listed above:  Exertional dyspnea: This did seem to have a situational component.  Normal physical exam and low BNP are reassuring.  Will go ahead with an echocardiogram to exclude structural heart disease. First-degree AV block: No symptoms of syncope or near syncope were significant bradycardia to suggest higher grade AV block.  Describe the symptoms or circumstances in which she should call to be reevaluated to see if there has been progression of AV node disease. Mixed hyperlipidemia: I think in the absence of PAD/CAD and LDL cholesterol less than 100 is a satisfactory target, keeping in mind that he has a low HDL cholesterol and mild hypertriglyceridemia.  He prefers not to take statins and I think this is reasonable, unless we do identify vascular problems.           Medication Adjustments/Labs and Tests Ordered: Current medicines are reviewed at length with the patient today.  Concerns regarding  medicines are outlined above.  Orders Placed This Encounter  Procedures   EKG 12-Lead   ECHOCARDIOGRAM COMPLETE   No orders of the defined types were placed in this encounter.   Patient Instructions  Medication Instructions:  No Changes In Medications at this time.  *If you need a refill on your cardiac medications before your next appointment, please call your pharmacy*  Lab Work: None Ordered At This Time.  If you have labs (blood work) drawn today and your tests are completely normal, you will receive your results only by: [place] (if you have MyChart) OR A paper copy in the mail If you have any lab test that is abnormal or we need to change your treatment, we will call you to review the results.  Testing/Procedures: Your physician has requested that you have an echocardiogram. Echocardiography is a painless test that uses sound waves to create images of your heart. It provides your doctor with information about the size and shape of your heart and how well your heart's chambers and valves are working. You may receive an ultrasound enhancing agent through an IV if needed to better visualize your heart during the echo.This procedure takes approximately one hour. There are no restrictions for this procedure. This will take place at the [address], [address].   Follow-Up: At [facility], you and your health needs are our priority.  As part of our continuing mission to provide you with exceptional heart care, we have created designated Provider Care Teams.  These Care Teams include your primary Cardiologist (physician) and Advanced Practice Providers (APPs -  Physician Assistants and Nurse Practitioners) who all work together to provide you with the care you need, when you need it.  Your next appointment:   AS NEEDED   The format for your next appointment:   In Person  Provider:   Sanda Klein, MD           Signed, Sanda Klein, MD  12/09/2021  11:58 AM    [place]

## 2021-12-09 NOTE — Patient Instructions (Signed)
Medication Instructions:  No Changes In Medications at this time.  *If you need a refill on your cardiac medications before your next appointment, please call your pharmacy*  Lab Work: None Ordered At This Time.  If you have labs (blood work) drawn today and your tests are completely normal, you will receive your results only by: [place] (if you have MyChart) OR A paper copy in the mail If you have any lab test that is abnormal or we need to change your treatment, we will call you to review the results.  Testing/Procedures: Your physician has requested that you have an echocardiogram. Echocardiography is a painless test that uses sound waves to create images of your heart. It provides your doctor with information about the size and shape of your heart and how well your heart's chambers and valves are working. You may receive an ultrasound enhancing agent through an IV if needed to better visualize your heart during the echo.This procedure takes approximately one hour. There are no restrictions for this procedure. This will take place at the [address], [address].   Follow-Up: At [facility], you and your health needs are our priority.  As part of our continuing mission to provide you with exceptional heart care, we have created designated Provider Care Teams.  These Care Teams include your primary Cardiologist (physician) and Advanced Practice Providers (APPs -  Physician Assistants and Nurse Practitioners) who all work together to provide you with the care you need, when you need it.  Your next appointment:   AS NEEDED   The format for your next appointment:   In Person  Provider:   Sanda Klein, MD

## 2021-12-14 ENCOUNTER — Other Ambulatory Visit (HOSPITAL_COMMUNITY): Payer: Self-pay

## 2021-12-16 ENCOUNTER — Ambulatory Visit (INDEPENDENT_AMBULATORY_CARE_PROVIDER_SITE_OTHER): Payer: PPO

## 2021-12-16 DIAGNOSIS — R0602 Shortness of breath: Secondary | ICD-10-CM | POA: Diagnosis not present

## 2021-12-16 LAB — ECHOCARDIOGRAM COMPLETE
Area-P 1/2: 3.21 cm2
Calc EF: 55.4 %
P 1/2 time: 781 msec
S' Lateral: 2.95 cm
Single Plane A2C EF: 53.4 %
Single Plane A4C EF: 58.9 %

## 2021-12-20 ENCOUNTER — Telehealth: Payer: Self-pay | Admitting: Family Medicine

## 2021-12-20 NOTE — Telephone Encounter (Signed)
Received a fax from the office this morning for claim denial for them to resubmit with new dx code, states a new claim needs to be submitted.

## 2021-12-20 NOTE — Telephone Encounter (Signed)
Left detailed message for Amy regarding the message below, advised to call the office for more information

## 2021-12-22 ENCOUNTER — Other Ambulatory Visit (HOSPITAL_COMMUNITY): Payer: Self-pay

## 2021-12-30 ENCOUNTER — Ambulatory Visit (INDEPENDENT_AMBULATORY_CARE_PROVIDER_SITE_OTHER): Payer: PPO

## 2021-12-30 DIAGNOSIS — E538 Deficiency of other specified B group vitamins: Secondary | ICD-10-CM | POA: Diagnosis not present

## 2021-12-30 MED ORDER — CYANOCOBALAMIN 1000 MCG/ML IJ SOLN
1000.0000 ug | Freq: Once | INTRAMUSCULAR | Status: AC
  Administered 2021-12-30: 1000 ug via INTRAMUSCULAR

## 2021-12-30 NOTE — Progress Notes (Signed)
Per orders of Dr. Sarajane Jews, injection of Cyanocobalamin 1000 mcg/ml given by Encarnacion Slates on R deltoid.  Patient tolerated injection well.   Next Vitamin B12 injection is scheduled on 01/20/2022.

## 2022-01-07 ENCOUNTER — Ambulatory Visit: Payer: PPO

## 2022-01-07 ENCOUNTER — Other Ambulatory Visit (HOSPITAL_COMMUNITY): Payer: PPO

## 2022-01-11 ENCOUNTER — Other Ambulatory Visit: Payer: Self-pay | Admitting: Family Medicine

## 2022-01-20 ENCOUNTER — Ambulatory Visit: Payer: PPO

## 2022-01-21 ENCOUNTER — Ambulatory Visit (INDEPENDENT_AMBULATORY_CARE_PROVIDER_SITE_OTHER): Payer: Medicare HMO

## 2022-01-21 DIAGNOSIS — E538 Deficiency of other specified B group vitamins: Secondary | ICD-10-CM | POA: Diagnosis not present

## 2022-01-21 MED ORDER — CYANOCOBALAMIN 1000 MCG/ML IJ SOLN
1000.0000 ug | Freq: Once | INTRAMUSCULAR | Status: AC
  Administered 2022-01-21: 1000 ug via INTRAMUSCULAR

## 2022-01-21 NOTE — Progress Notes (Signed)
Per orders of Dr. Fry, injection of Cyanocobalamin 1000 mcg given by Teodor Prater L [place] Spees. °Patient tolerated injection well.  °

## 2022-01-25 DIAGNOSIS — D485 Neoplasm of uncertain behavior of skin: Secondary | ICD-10-CM | POA: Diagnosis not present

## 2022-01-25 DIAGNOSIS — L570 Actinic keratosis: Secondary | ICD-10-CM | POA: Diagnosis not present

## 2022-02-15 ENCOUNTER — Other Ambulatory Visit: Payer: Self-pay | Admitting: Family Medicine

## 2022-02-16 ENCOUNTER — Ambulatory Visit (INDEPENDENT_AMBULATORY_CARE_PROVIDER_SITE_OTHER): Payer: Medicare HMO

## 2022-02-16 DIAGNOSIS — E538 Deficiency of other specified B group vitamins: Secondary | ICD-10-CM

## 2022-02-16 MED ORDER — CYANOCOBALAMIN 1000 MCG/ML IJ SOLN
1000.0000 ug | Freq: Once | INTRAMUSCULAR | Status: AC
  Administered 2022-02-16: 1000 ug via INTRAMUSCULAR

## 2022-02-16 NOTE — Progress Notes (Signed)
Per orders of Dr. Sarajane Jews, injection of cyanocobalamin Inj. 1000 mcg given by Encarnacion Slates on Right Deltoid.  Patient tolerated injection well.

## 2022-03-02 ENCOUNTER — Other Ambulatory Visit: Payer: Self-pay | Admitting: Family Medicine

## 2022-03-03 ENCOUNTER — Encounter: Payer: Self-pay | Admitting: Family Medicine

## 2022-03-03 ENCOUNTER — Ambulatory Visit (INDEPENDENT_AMBULATORY_CARE_PROVIDER_SITE_OTHER): Payer: Medicare HMO | Admitting: Family Medicine

## 2022-03-03 VITALS — BP 128/70 | HR 70 | Temp 97.7°F | Wt 230.0 lb

## 2022-03-03 DIAGNOSIS — E039 Hypothyroidism, unspecified: Secondary | ICD-10-CM | POA: Diagnosis not present

## 2022-03-03 DIAGNOSIS — E538 Deficiency of other specified B group vitamins: Secondary | ICD-10-CM

## 2022-03-03 DIAGNOSIS — R739 Hyperglycemia, unspecified: Secondary | ICD-10-CM

## 2022-03-03 DIAGNOSIS — G629 Polyneuropathy, unspecified: Secondary | ICD-10-CM | POA: Diagnosis not present

## 2022-03-03 LAB — TSH: TSH: 1.66 u[IU]/mL (ref 0.35–5.50)

## 2022-03-03 LAB — T3, FREE: T3, Free: 2.8 pg/mL (ref 2.3–4.2)

## 2022-03-03 LAB — VITAMIN B12: Vitamin B-12: 701 pg/mL (ref 211–911)

## 2022-03-03 LAB — T4, FREE: Free T4: 0.6 ng/dL (ref 0.60–1.60)

## 2022-03-03 MED ORDER — GABAPENTIN 300 MG PO CAPS: 300.0000 mg | ORAL_CAPSULE | Freq: Every day | ORAL | 2 refills | Status: DC

## 2022-03-03 NOTE — Progress Notes (Signed)
   Subjective:    Patient ID: Matthew House, male    DOB: [birth year], 82 y.o.   MRN: [id number]  HPI Here to discuss the neuropathy in his feet. This started about 2 years ago and it has slowly gotten worse. He describes burning pains at times, but most often he has numbness. We have been treating him for B12 deficiency, and the last level we checked in October was 604. However on his own he has reduced the frequency of his shots from every 3 weeks to every 4 weeks since then.    Review of Systems  Constitutional: Negative.   Respiratory: Negative.    Cardiovascular: Negative.   Neurological:  Positive for numbness.       Objective:   Physical Exam Constitutional:      Appearance: Normal appearance.  Cardiovascular:     Rate and Rhythm: Normal rate and regular rhythm.     Pulses: Normal pulses.     Heart sounds: Normal heart sounds.  Pulmonary:     Effort: Pulmonary effort is normal.     Breath sounds: Normal breath sounds.  Neurological:     Mental Status: He is alert and oriented to person, place, and time. Mental status is at baseline.           Assessment & Plan:  Neuropathy. We will treat this with Gabapentin 300 mg at bedtime. We will also check labs today for B12, A1c, and a thyroid panel to look for possible sources of this.  Alysia Penna, MD

## 2022-03-04 LAB — HEMOGLOBIN A1C: Hgb A1c MFr Bld: 5.3 % (ref 4.6–6.5)

## 2022-03-08 DIAGNOSIS — G603 Idiopathic progressive neuropathy: Secondary | ICD-10-CM | POA: Diagnosis not present

## 2022-03-08 DIAGNOSIS — S3992XA Unspecified injury of lower back, initial encounter: Secondary | ICD-10-CM | POA: Diagnosis not present

## 2022-03-08 DIAGNOSIS — M1711 Unilateral primary osteoarthritis, right knee: Secondary | ICD-10-CM | POA: Diagnosis not present

## 2022-03-08 DIAGNOSIS — M9908 Segmental and somatic dysfunction of rib cage: Secondary | ICD-10-CM | POA: Diagnosis not present

## 2022-03-08 DIAGNOSIS — M9905 Segmental and somatic dysfunction of pelvic region: Secondary | ICD-10-CM | POA: Diagnosis not present

## 2022-03-08 DIAGNOSIS — M9906 Segmental and somatic dysfunction of lower extremity: Secondary | ICD-10-CM | POA: Diagnosis not present

## 2022-03-08 DIAGNOSIS — M9901 Segmental and somatic dysfunction of cervical region: Secondary | ICD-10-CM | POA: Diagnosis not present

## 2022-03-08 DIAGNOSIS — M9902 Segmental and somatic dysfunction of thoracic region: Secondary | ICD-10-CM | POA: Diagnosis not present

## 2022-03-14 ENCOUNTER — Ambulatory Visit (INDEPENDENT_AMBULATORY_CARE_PROVIDER_SITE_OTHER): Payer: Medicare HMO

## 2022-03-14 DIAGNOSIS — E538 Deficiency of other specified B group vitamins: Secondary | ICD-10-CM | POA: Diagnosis not present

## 2022-03-14 MED ORDER — CYANOCOBALAMIN 1000 MCG/ML IJ SOLN
1000.0000 ug | Freq: Once | INTRAMUSCULAR | Status: AC
  Administered 2022-03-14: 1000 ug via INTRAMUSCULAR

## 2022-03-14 NOTE — Progress Notes (Signed)
Pt here for monthly B12 injection per Dr Sarajane Jews  B12 1060mg given IM Left Deltoid, and pt tolerated injection well.  Next B12 injection scheduled for 04/13/22

## 2022-03-17 DIAGNOSIS — M1711 Unilateral primary osteoarthritis, right knee: Secondary | ICD-10-CM | POA: Diagnosis not present

## 2022-03-17 DIAGNOSIS — M25561 Pain in right knee: Secondary | ICD-10-CM | POA: Diagnosis not present

## 2022-03-24 ENCOUNTER — Telehealth: Payer: Self-pay | Admitting: Family Medicine

## 2022-03-24 NOTE — Telephone Encounter (Signed)
Pt needs 3 mth f/u.

## 2022-03-24 NOTE — Telephone Encounter (Signed)
What does this mean? If he wants to make an appt, then please make it

## 2022-03-29 ENCOUNTER — Ambulatory Visit: Payer: Medicare HMO | Admitting: Family Medicine

## 2022-04-13 ENCOUNTER — Ambulatory Visit (INDEPENDENT_AMBULATORY_CARE_PROVIDER_SITE_OTHER): Payer: Medicare HMO | Admitting: *Deleted

## 2022-04-13 DIAGNOSIS — E538 Deficiency of other specified B group vitamins: Secondary | ICD-10-CM | POA: Diagnosis not present

## 2022-04-13 MED ORDER — CYANOCOBALAMIN 1000 MCG/ML IJ SOLN
1000.0000 ug | Freq: Once | INTRAMUSCULAR | Status: AC
  Administered 2022-04-13: 1000 ug via INTRAMUSCULAR

## 2022-04-13 NOTE — Progress Notes (Signed)
Per orders of Dr. Fry, injection of Cyanocobalamin 1000mcg given by Angelissa Supan A. Patient tolerated injection well.  

## 2022-04-14 DIAGNOSIS — M1711 Unilateral primary osteoarthritis, right knee: Secondary | ICD-10-CM | POA: Diagnosis not present

## 2022-04-14 DIAGNOSIS — M25561 Pain in right knee: Secondary | ICD-10-CM | POA: Diagnosis not present

## 2022-05-03 ENCOUNTER — Encounter: Payer: Self-pay | Admitting: Internal Medicine

## 2022-05-03 ENCOUNTER — Ambulatory Visit (INDEPENDENT_AMBULATORY_CARE_PROVIDER_SITE_OTHER): Payer: Medicare HMO | Admitting: Internal Medicine

## 2022-05-03 ENCOUNTER — Telehealth: Payer: Self-pay | Admitting: Family Medicine

## 2022-05-03 VITALS — BP 144/58 | HR 72 | Temp 97.9°F | Ht 70.5 in | Wt 230.0 lb

## 2022-05-03 DIAGNOSIS — M79672 Pain in left foot: Secondary | ICD-10-CM

## 2022-05-03 DIAGNOSIS — M79671 Pain in right foot: Secondary | ICD-10-CM | POA: Diagnosis not present

## 2022-05-03 DIAGNOSIS — E538 Deficiency of other specified B group vitamins: Secondary | ICD-10-CM

## 2022-05-03 DIAGNOSIS — G629 Polyneuropathy, unspecified: Secondary | ICD-10-CM | POA: Diagnosis not present

## 2022-05-03 MED ORDER — CYANOCOBALAMIN 1000 MCG/ML IJ SOLN
1000.0000 ug | Freq: Once | INTRAMUSCULAR | Status: AC
  Administered 2022-05-03: 1000 ug via INTRAMUSCULAR

## 2022-05-03 NOTE — Telephone Encounter (Signed)
Please disregard - Pt was transferred to the Triage Nurse.

## 2022-05-03 NOTE — Progress Notes (Signed)
Chief Complaint  Patient presents with   Foot pain    Pt c/o of foot pain from neuropathy. Going for years. Pain worsen. Feet felt like on fire last night. Pt inform he tried HEMP brand cream, body magnesium cream for a month. Seems to help. Used compression socks.     HPI: Matthew House 82 y.o. come in fo [place]   pcp dr Matthew House  concern about foot  office power was off during most of visit and delayed documentation Had dx pn b12 defic  thyroid  Has discomfort and redness  . Has neuropathy  but noinjury   Gel shot  knees   using compression day.   And using topicals  Had an last pm  Magnesium hemp  topicals  Soak in epsoms salt pre  ROS: See pertinent positives and negatives per HPI.  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 No pertinent family history.  Social History   Socioeconomic History   Marital status: Married    Spouse name: Not on file   Number of children: 2   Years of education: 14   Highest education level: Not on file  Occupational History   Occupation: retired Emergency planning/management officer  Tobacco Use   Smoking status: Former    Types: Cigarettes    Quit date: 01/03/1978    Years since quitting: 44.3   Smokeless tobacco: Never  Vaping Use   Vaping Use: Never used  Substance and Sexual Activity   Alcohol use: No    Alcohol/week: 0.0 standard drinks of alcohol   Drug use: No   Sexual activity: Yes  Other Topics Concern   Not on file  Social History Narrative   Lives with wife in a 2 story home.  Has 2 children.  Retired from Patent examiner.  Education: some college.    Social Determinants of Health   Financial Resource Strain: Not on file  Food Insecurity: Not on file  Transportation Needs: Not on file  Physical Activity: Not on  file  Stress: Not on file  Social Connections: Not on file    Outpatient Medications Prior to Visit  Medication Sig Dispense Refill   cetirizine (ZYRTEC) 10 MG tablet Take 10 mg by mouth daily.     cyanocobalamin (,VITAMIN B-12,) 1000 MCG/ML injection Inject 1 mL (1,000 mcg total) into the muscle every 30 (thirty) days. 1 mL 0   famotidine (PEPCID) 40 MG tablet TAKE ONE TABLET BY MOUTH TWICE DAILY 60 tablet 1   Ferrous Sulfate (IRON PO) Take 1 tablet by mouth in the morning.     Sodium Hyaluronate (DUROLANE) 60 MG/3ML PRSY intra-articular injection HCP to inject 3 mL, intra-articular once in the right knee 3 mL 0   famotidine (QC FAMOTIDINE ACID REDUCER) 20 MG tablet TAKE TWO TABLETS BY MOUTH TWICE DAILY (Patient not taking: Reported on 05/03/2022) 180 tablet 0   gabapentin (NEURONTIN) 300 MG capsule Take 1 capsule (300 mg total) by mouth at bedtime. (Patient not taking: Reported on 05/03/2022) 30 capsule 2   Facility-Administered Medications Prior to Visit  Medication Dose Route Frequency Provider Last Rate Last Admin   cyanocobalamin (VITAMIN B12) injection 1,000 mcg  1,000 mcg Intramuscular Q30 days Nelwyn Salisbury,  MD   1,000 mcg at 11/08/21 0918     EXAM:  BP (!) 144/58 (BP Location: Right Arm, Patient Position: Sitting, Cuff Size: Large)   Pulse 72   Temp 97.9 F (36.6 C) (Oral)   Ht 5' 10.5" (1.791 m)   Wt 230 lb (104.3 kg)   SpO2 98%   BMI 32.54 kg/m   Body mass index is 32.54 kg/m.  GENERAL: vitals reviewed and listed above, alert, oriented, appears well hydrated and in no acute distress HEENT: atraumatic, conjunctiva  clear, no obvious abnormalities on inspection of external nose and ears NECK: no obvious masses on inspection palpation  MS: moves all extremities without noticeable focal  abnormality Feet no lesions  erythem of toes but no lesions ulcers  pulses intact  Ambulatory gait without assistance  PSYCH: pleasant and cooperative, no obvious depression or  anxiety Lab Results  Component Value Date   WBC 6.8 11/22/2021   HGB 12.1 (L) 11/22/2021   HCT 34.6 (L) 11/22/2021   PLT 174 11/22/2021   GLUCOSE 110 (H) 11/22/2021   CHOL 156 12/01/2021   TRIG 334.0 (H) 12/01/2021   HDL 24.00 (L) 12/01/2021   LDLDIRECT 90.0 12/01/2021   LDLCALC 35 10/24/2019   ALT 21 11/22/2021   AST 18 11/22/2021   NA 138 11/22/2021   K 3.8 11/22/2021   CL 102 11/22/2021   CREATININE 0.83 11/22/2021   BUN 14 11/22/2021   CO2 24 11/22/2021   TSH 1.66 03/03/2022   PSA 0.00 (L) 12/01/2021   INR 1.1 11/22/2021   HGBA1C 5.1 05/05/2022   BP Readings from Last 3 Encounters:  05/03/22 (!) 144/58  03/03/22 128/70  12/09/21 138/68    ASSESSMENT AND PLAN:  Discussed the following assessment and plan:  Pain in both feet  B12 deficiency - Plan: cyanocobalamin (VITAMIN B12) injection 1,000 mcg  Neuropathy Hx of neuropathy and findings could be related to heat baths   Avoid excess heat or cold  creams ok  and follow  to come back tomorrow for A1c check . Fu if  persistent or progressive findings  -Patient advised to return or notify health care team  if  new concerns arise.  There are no Patient Instructions on file for this visit.   Matthew House. Matthew House M.D.

## 2022-05-04 ENCOUNTER — Ambulatory Visit (INDEPENDENT_AMBULATORY_CARE_PROVIDER_SITE_OTHER): Payer: Medicare HMO

## 2022-05-04 DIAGNOSIS — R739 Hyperglycemia, unspecified: Secondary | ICD-10-CM

## 2022-05-05 ENCOUNTER — Ambulatory Visit
Admission: RE | Admit: 2022-05-05 | Discharge: 2022-05-05 | Disposition: A | Discharge status: Home / Self Care | Payer: Medicare HMO | Source: Ambulatory Visit | Attending: Sports Medicine | Admitting: Sports Medicine

## 2022-05-05 ENCOUNTER — Other Ambulatory Visit: Payer: Self-pay | Admitting: Sports Medicine

## 2022-05-05 DIAGNOSIS — M25561 Pain in right knee: Secondary | ICD-10-CM

## 2022-05-05 DIAGNOSIS — G603 Idiopathic progressive neuropathy: Secondary | ICD-10-CM | POA: Diagnosis not present

## 2022-05-05 LAB — POCT GLYCOSYLATED HEMOGLOBIN (HGB A1C): Hemoglobin A1C: 5.1 % (ref 4.0–5.6)

## 2022-05-05 NOTE — Progress Notes (Signed)
Per Dr. Fabian Sharp, patient came to nurse visit for POC A1c check on 05/04/2022.   Pt updated on his foot issues. He stated that he did not soak his feet in hot water nor use the epsom salt. He had used the magnesium hemp topical then put on thin socks. Doing that, relieve his pain.

## 2022-05-06 ENCOUNTER — Ambulatory Visit: Payer: Medicare HMO

## 2022-05-08 ENCOUNTER — Encounter: Payer: Self-pay | Admitting: Internal Medicine

## 2022-05-12 ENCOUNTER — Other Ambulatory Visit: Payer: Self-pay | Admitting: Family Medicine

## 2022-05-28 ENCOUNTER — Other Ambulatory Visit (HOSPITAL_COMMUNITY): Payer: Self-pay

## 2022-06-02 ENCOUNTER — Ambulatory Visit (INDEPENDENT_AMBULATORY_CARE_PROVIDER_SITE_OTHER): Payer: Medicare HMO

## 2022-06-02 ENCOUNTER — Ambulatory Visit: Payer: Medicare HMO

## 2022-06-02 DIAGNOSIS — E538 Deficiency of other specified B group vitamins: Secondary | ICD-10-CM | POA: Diagnosis not present

## 2022-06-02 MED ORDER — CYANOCOBALAMIN 1000 MCG/ML IJ SOLN
1000.0000 ug | Freq: Once | INTRAMUSCULAR | Status: AC
Start: 2022-06-02 — End: 2022-06-02
  Administered 2022-06-02: 1000 ug via INTRAMUSCULAR

## 2022-06-02 NOTE — Progress Notes (Addendum)
Per orders of Dr. Ardyth Harps, injection of Cyanocobalamin 100 mcg given by Jenavie Stanczak L Pinki Rottman. Patient tolerated injection well.  During Nursing visit patient also requested blood pressure check. Blood Pressure reading was 130/60 at time of visit.

## 2022-06-15 ENCOUNTER — Telehealth: Payer: Self-pay | Admitting: Family Medicine

## 2022-06-15 MED ORDER — DIPHENOXYLATE-ATROPINE 2.5-0.025 MG PO TABS: 2.0000 | ORAL_TABLET | Freq: Four times a day (QID) | ORAL | 5 refills | Status: DC | PRN

## 2022-06-15 NOTE — Telephone Encounter (Signed)
Done

## 2022-06-15 NOTE — Telephone Encounter (Signed)
Patient is taking Diphenoxylate for constipation.  He showed me a picture of the bottle on his cell phone.  Patient states that Dr. Clent Ridges gave it to him in October.  It was for 3 times a day, however he backed it down to once a day so it has lasted him awhile.  He is requesting a refill called in, however I don't see it on his medication list.  If patient could receive a call back.  He is out of this medication.   Pharmacy- [facility]

## 2022-06-16 DIAGNOSIS — L718 Other rosacea: Secondary | ICD-10-CM | POA: Diagnosis not present

## 2022-06-16 DIAGNOSIS — Z85828 Personal history of other malignant neoplasm of skin: Secondary | ICD-10-CM | POA: Diagnosis not present

## 2022-06-16 DIAGNOSIS — L814 Other melanin hyperpigmentation: Secondary | ICD-10-CM | POA: Diagnosis not present

## 2022-06-16 DIAGNOSIS — L821 Other seborrheic keratosis: Secondary | ICD-10-CM | POA: Diagnosis not present

## 2022-06-16 DIAGNOSIS — F424 Excoriation (skin-picking) disorder: Secondary | ICD-10-CM | POA: Diagnosis not present

## 2022-06-16 DIAGNOSIS — D225 Melanocytic nevi of trunk: Secondary | ICD-10-CM | POA: Diagnosis not present

## 2022-06-16 DIAGNOSIS — Z08 Encounter for follow-up examination after completed treatment for malignant neoplasm: Secondary | ICD-10-CM | POA: Diagnosis not present

## 2022-06-16 DIAGNOSIS — L578 Other skin changes due to chronic exposure to nonionizing radiation: Secondary | ICD-10-CM | POA: Diagnosis not present

## 2022-06-28 DIAGNOSIS — H16223 Keratoconjunctivitis sicca, not specified as Sjogren's, bilateral: Secondary | ICD-10-CM | POA: Diagnosis not present

## 2022-06-28 DIAGNOSIS — H57813 Brow ptosis, bilateral: Secondary | ICD-10-CM | POA: Diagnosis not present

## 2022-06-28 DIAGNOSIS — H524 Presbyopia: Secondary | ICD-10-CM | POA: Diagnosis not present

## 2022-06-28 DIAGNOSIS — Z0100 Encounter for examination of eyes and vision without abnormal findings: Secondary | ICD-10-CM | POA: Diagnosis not present

## 2022-06-28 DIAGNOSIS — H43823 Vitreomacular adhesion, bilateral: Secondary | ICD-10-CM | POA: Diagnosis not present

## 2022-06-28 DIAGNOSIS — H25813 Combined forms of age-related cataract, bilateral: Secondary | ICD-10-CM | POA: Diagnosis not present

## 2022-06-29 DIAGNOSIS — M9901 Segmental and somatic dysfunction of cervical region: Secondary | ICD-10-CM | POA: Diagnosis not present

## 2022-06-29 DIAGNOSIS — S134XXA Sprain of ligaments of cervical spine, initial encounter: Secondary | ICD-10-CM | POA: Diagnosis not present

## 2022-06-29 DIAGNOSIS — M50323 Other cervical disc degeneration at C6-C7 level: Secondary | ICD-10-CM | POA: Diagnosis not present

## 2022-06-29 DIAGNOSIS — M25512 Pain in left shoulder: Secondary | ICD-10-CM | POA: Diagnosis not present

## 2022-06-30 DIAGNOSIS — M50323 Other cervical disc degeneration at C6-C7 level: Secondary | ICD-10-CM | POA: Diagnosis not present

## 2022-06-30 DIAGNOSIS — M9901 Segmental and somatic dysfunction of cervical region: Secondary | ICD-10-CM | POA: Diagnosis not present

## 2022-06-30 DIAGNOSIS — M25512 Pain in left shoulder: Secondary | ICD-10-CM | POA: Diagnosis not present

## 2022-06-30 DIAGNOSIS — S134XXA Sprain of ligaments of cervical spine, initial encounter: Secondary | ICD-10-CM | POA: Diagnosis not present

## 2022-07-01 DIAGNOSIS — M9901 Segmental and somatic dysfunction of cervical region: Secondary | ICD-10-CM | POA: Diagnosis not present

## 2022-07-01 DIAGNOSIS — M25512 Pain in left shoulder: Secondary | ICD-10-CM | POA: Diagnosis not present

## 2022-07-01 DIAGNOSIS — M50323 Other cervical disc degeneration at C6-C7 level: Secondary | ICD-10-CM | POA: Diagnosis not present

## 2022-07-01 DIAGNOSIS — S134XXA Sprain of ligaments of cervical spine, initial encounter: Secondary | ICD-10-CM | POA: Diagnosis not present

## 2022-07-04 ENCOUNTER — Ambulatory Visit (INDEPENDENT_AMBULATORY_CARE_PROVIDER_SITE_OTHER): Payer: Medicare HMO

## 2022-07-04 VITALS — BP 130/60

## 2022-07-04 DIAGNOSIS — E538 Deficiency of other specified B group vitamins: Secondary | ICD-10-CM

## 2022-07-04 DIAGNOSIS — S134XXA Sprain of ligaments of cervical spine, initial encounter: Secondary | ICD-10-CM | POA: Diagnosis not present

## 2022-07-04 DIAGNOSIS — M9901 Segmental and somatic dysfunction of cervical region: Secondary | ICD-10-CM | POA: Diagnosis not present

## 2022-07-04 DIAGNOSIS — M25512 Pain in left shoulder: Secondary | ICD-10-CM | POA: Diagnosis not present

## 2022-07-04 DIAGNOSIS — M50323 Other cervical disc degeneration at C6-C7 level: Secondary | ICD-10-CM | POA: Diagnosis not present

## 2022-07-04 MED ORDER — CYANOCOBALAMIN 1000 MCG/ML IJ SOLN
1000.0000 ug | Freq: Once | INTRAMUSCULAR | Status: AC
Start: 2022-07-04 — End: 2022-07-04
  Administered 2022-07-04: 1000 ug via INTRAMUSCULAR

## 2022-07-04 NOTE — Progress Notes (Signed)
Pt here for monthly B12 injection per Dr. Clent Ridges.  B12 given IM and pt tolerated injection well.

## 2022-07-05 ENCOUNTER — Other Ambulatory Visit: Payer: Self-pay | Admitting: Family Medicine

## 2022-07-06 DIAGNOSIS — M25512 Pain in left shoulder: Secondary | ICD-10-CM | POA: Diagnosis not present

## 2022-07-06 DIAGNOSIS — S134XXA Sprain of ligaments of cervical spine, initial encounter: Secondary | ICD-10-CM | POA: Diagnosis not present

## 2022-07-06 DIAGNOSIS — M9901 Segmental and somatic dysfunction of cervical region: Secondary | ICD-10-CM | POA: Diagnosis not present

## 2022-07-06 DIAGNOSIS — M50323 Other cervical disc degeneration at C6-C7 level: Secondary | ICD-10-CM | POA: Diagnosis not present

## 2022-07-12 DIAGNOSIS — S134XXA Sprain of ligaments of cervical spine, initial encounter: Secondary | ICD-10-CM | POA: Diagnosis not present

## 2022-07-12 DIAGNOSIS — M50323 Other cervical disc degeneration at C6-C7 level: Secondary | ICD-10-CM | POA: Diagnosis not present

## 2022-07-12 DIAGNOSIS — M9901 Segmental and somatic dysfunction of cervical region: Secondary | ICD-10-CM | POA: Diagnosis not present

## 2022-07-12 DIAGNOSIS — M25512 Pain in left shoulder: Secondary | ICD-10-CM | POA: Diagnosis not present

## 2022-07-27 ENCOUNTER — Telehealth: Payer: Self-pay

## 2022-07-27 NOTE — Telephone Encounter (Signed)
LVM for patient to call back [phone], or to call PCP office to schedule follow up apt. AS, CMA  

## 2022-07-29 DIAGNOSIS — M25561 Pain in right knee: Secondary | ICD-10-CM | POA: Diagnosis not present

## 2022-07-29 DIAGNOSIS — M25461 Effusion, right knee: Secondary | ICD-10-CM | POA: Diagnosis not present

## 2022-07-29 DIAGNOSIS — M1711 Unilateral primary osteoarthritis, right knee: Secondary | ICD-10-CM | POA: Diagnosis not present

## 2022-08-03 ENCOUNTER — Encounter (INDEPENDENT_AMBULATORY_CARE_PROVIDER_SITE_OTHER): Payer: Self-pay

## 2022-08-03 ENCOUNTER — Ambulatory Visit (INDEPENDENT_AMBULATORY_CARE_PROVIDER_SITE_OTHER): Payer: Medicare HMO

## 2022-08-03 DIAGNOSIS — E538 Deficiency of other specified B group vitamins: Secondary | ICD-10-CM | POA: Diagnosis not present

## 2022-08-03 NOTE — Progress Notes (Signed)
Per orders of Dr. Clent Ridges, injection of Cyanocobalamin 1000 mcg given by Lindie Spruce  [place] Right Deltoid. Patient tolerated injection well.   Pt updates he had a cortisone R knee injection last week and wonder if his current medication would interact with it. Inform him I can forward his question to Dr. Clent Ridges. Pt states it is fine, since he is going to pharmacy, he can ask them.

## 2022-08-09 DIAGNOSIS — M9902 Segmental and somatic dysfunction of thoracic region: Secondary | ICD-10-CM | POA: Diagnosis not present

## 2022-08-09 DIAGNOSIS — S134XXA Sprain of ligaments of cervical spine, initial encounter: Secondary | ICD-10-CM | POA: Diagnosis not present

## 2022-08-09 DIAGNOSIS — M25512 Pain in left shoulder: Secondary | ICD-10-CM | POA: Diagnosis not present

## 2022-08-09 DIAGNOSIS — M50323 Other cervical disc degeneration at C6-C7 level: Secondary | ICD-10-CM | POA: Diagnosis not present

## 2022-08-09 DIAGNOSIS — M9903 Segmental and somatic dysfunction of lumbar region: Secondary | ICD-10-CM | POA: Diagnosis not present

## 2022-08-09 DIAGNOSIS — M9901 Segmental and somatic dysfunction of cervical region: Secondary | ICD-10-CM | POA: Diagnosis not present

## 2022-08-18 ENCOUNTER — Telehealth: Payer: Self-pay

## 2022-08-18 NOTE — Telephone Encounter (Signed)
LVM for patient to call back [phone], or to call PCP office to schedule follow up apt. AS, CMA  

## 2022-08-26 ENCOUNTER — Ambulatory Visit (INDEPENDENT_AMBULATORY_CARE_PROVIDER_SITE_OTHER): Payer: Medicare HMO

## 2022-08-26 ENCOUNTER — Telehealth: Payer: Self-pay

## 2022-08-26 DIAGNOSIS — E538 Deficiency of other specified B group vitamins: Secondary | ICD-10-CM | POA: Diagnosis not present

## 2022-08-26 MED ORDER — CYANOCOBALAMIN 1000 MCG/ML IJ SOLN
1000.0000 ug | Freq: Once | INTRAMUSCULAR | Status: AC
Start: 2022-08-26 — End: 2022-08-26
  Administered 2022-08-26: 1000 ug via INTRAMUSCULAR

## 2022-08-26 NOTE — Progress Notes (Signed)
Per orders of Shirline Frees, NP, injection of Cyanocobalamin inj. 1,000 mcg given by Vickii Chafe on L deltoid.  Patient tolerated injection well.

## 2022-08-26 NOTE — Telephone Encounter (Signed)
Pt was here in office for B12 injection.   Pt request his BP to be check to assure it is close what his BP at home.   120/50 on L.  120/60 on R.  Pt reports he fell asleep while watching news yesterday. He brought up that he used to get B12 injection for 3 every wks. He had more energy then with every 3wk injection vs now with every 3.5 wks.   Pt would like to go back to every 3wk injection. Please advise.

## 2022-08-29 ENCOUNTER — Other Ambulatory Visit: Payer: Self-pay | Admitting: Family Medicine

## 2022-08-30 ENCOUNTER — Ambulatory Visit: Payer: Medicare HMO | Admitting: Family Medicine

## 2022-08-30 NOTE — Telephone Encounter (Signed)
Please change him back to shots every 3 weeks

## 2022-08-30 NOTE — Telephone Encounter (Signed)
Spoke to pt. B12 inject appt is reschedule to every 3 wks. No further action is needed

## 2022-09-01 ENCOUNTER — Telehealth: Payer: Self-pay | Admitting: Family Medicine

## 2022-09-01 NOTE — Telephone Encounter (Signed)
Pt stopped by the office and stated his blood pressure has been elevated.  He has recently gotten a Cortisone shot in his right knew a few weeks ago.  He stated it took about 10 days to notice side effects.  His blood pressure has been elevated and he is having a few dizzy spells.    Before getting the last B12 pt stated his bottom number was 50.  Blood pressure readings on 08/27/22-       -AM- [facility] tech) Right arm- 152/76, pulse 75                                                          Left arm-  154/74, Pulse 75       - PM- R- 158/66- Pulse 65                L- 161/68   [facility] pharmacist stated that Losartan would be a good medication for high blood pressure

## 2022-09-02 NOTE — Telephone Encounter (Signed)
This could be a transient effect of the steroid shot. Let just watch and see what happens. If the BP is still up 2 weeks from now, then we will start on a medication

## 2022-09-02 NOTE — Telephone Encounter (Signed)
Spoke with pt advised of Dr Clent Ridges recommendation, pt state that he will keep record of the BP and bring it in at his appointment on 09/05/22

## 2022-09-06 ENCOUNTER — Encounter: Payer: Self-pay | Admitting: Family Medicine

## 2022-09-06 ENCOUNTER — Ambulatory Visit (INDEPENDENT_AMBULATORY_CARE_PROVIDER_SITE_OTHER): Payer: Medicare HMO | Admitting: Family Medicine

## 2022-09-06 VITALS — BP 130/68 | HR 69 | Temp 98.2°F | Wt 234.0 lb

## 2022-09-06 DIAGNOSIS — R030 Elevated blood-pressure reading, without diagnosis of hypertension: Secondary | ICD-10-CM

## 2022-09-06 DIAGNOSIS — M9902 Segmental and somatic dysfunction of thoracic region: Secondary | ICD-10-CM | POA: Diagnosis not present

## 2022-09-06 DIAGNOSIS — M9901 Segmental and somatic dysfunction of cervical region: Secondary | ICD-10-CM | POA: Diagnosis not present

## 2022-09-06 DIAGNOSIS — M9903 Segmental and somatic dysfunction of lumbar region: Secondary | ICD-10-CM | POA: Diagnosis not present

## 2022-09-06 DIAGNOSIS — S134XXA Sprain of ligaments of cervical spine, initial encounter: Secondary | ICD-10-CM | POA: Diagnosis not present

## 2022-09-06 NOTE — Progress Notes (Signed)
   Subjective:    Patient ID: Matthew House, male    DOB: 1940-02-08, 82 y.o.   MRN: [id number]  HPI Here to follow up on some elevated BP readings. Starting 3 days ago he has had some systolic readings in the 150's and 160's. The diastolic readings are in the 60's. He has felt fine. Today the BP is back down. He admits to eating more salty foods over the weekend than he usually does.   Review of Systems  Constitutional: Negative.   Respiratory: Negative.    Cardiovascular: Negative.        Objective:   Physical Exam Constitutional:      Appearance: Normal appearance.  Cardiovascular:     Rate and Rhythm: Normal rate and regular rhythm.     Pulses: Normal pulses.     Heart sounds: Normal heart sounds.  Pulmonary:     Effort: Pulmonary effort is normal.     Breath sounds: Normal breath sounds.  Neurological:     Mental Status: He is alert.           Assessment & Plan:  His BP bumped up transiently because of his sodium intake. It seems to be fine now. He will monitor this at home and will return if it goes up consistently.  Gershon Crane, MD

## 2022-09-08 NOTE — Telephone Encounter (Signed)
Rx not on pt medication list please advise

## 2022-09-08 NOTE — Telephone Encounter (Signed)
Patient was here to see Dr. Clent Ridges and forgot to mention that he would like a prescription for Flowmax.  He states he is two or more times per night and very slow to start and empty bladder.  Pharmacy-- [facility]

## 2022-09-09 ENCOUNTER — Other Ambulatory Visit: Payer: Self-pay

## 2022-09-09 MED ORDER — TAMSULOSIN HCL 0.4 MG PO CAPS: 0.4000 mg | ORAL_CAPSULE | Freq: Every day | ORAL | 3 refills | Status: DC

## 2022-09-09 NOTE — Telephone Encounter (Signed)
Pt Rx sent and pt notified

## 2022-09-09 NOTE — Telephone Encounter (Signed)
Call in Flomax 0.4 mg to take daily, #90 with 3 rf

## 2022-09-16 ENCOUNTER — Ambulatory Visit (INDEPENDENT_AMBULATORY_CARE_PROVIDER_SITE_OTHER): Payer: Medicare HMO

## 2022-09-16 ENCOUNTER — Other Ambulatory Visit (INDEPENDENT_AMBULATORY_CARE_PROVIDER_SITE_OTHER): Payer: Medicare HMO

## 2022-09-16 ENCOUNTER — Telehealth: Payer: Self-pay | Admitting: Family Medicine

## 2022-09-16 DIAGNOSIS — E538 Deficiency of other specified B group vitamins: Secondary | ICD-10-CM

## 2022-09-16 DIAGNOSIS — R109 Unspecified abdominal pain: Secondary | ICD-10-CM

## 2022-09-16 LAB — POC URINALSYSI DIPSTICK (AUTOMATED)
Bilirubin, UA: NEGATIVE
Blood, UA: NEGATIVE
Glucose, UA: NEGATIVE
Ketones, UA: NEGATIVE
Leukocytes, UA: NEGATIVE
Nitrite, UA: NEGATIVE
Protein, UA: NEGATIVE
Spec Grav, UA: 1.02 (ref 1.010–1.025)
Urobilinogen, UA: 0.2 U/dL
pH, UA: 6 (ref 5.0–8.0)

## 2022-09-16 MED ORDER — CYANOCOBALAMIN 1000 MCG/ML IJ SOLN
1000.0000 ug | Freq: Once | INTRAMUSCULAR | Status: AC
Start: 2022-09-16 — End: 2022-09-16
  Administered 2022-09-16: 1000 ug via INTRAMUSCULAR

## 2022-09-16 NOTE — Telephone Encounter (Signed)
Patient just wanted to let Dr. Clent House know that is BP today was taken by our nurse in the office and it was 140/70.

## 2022-09-16 NOTE — Progress Notes (Signed)
Per orders of Dr.Fry , injection of B12  given by Stann Ore. Patient tolerated injection well.

## 2022-09-21 ENCOUNTER — Ambulatory Visit: Payer: Medicare HMO

## 2022-10-04 DIAGNOSIS — M9901 Segmental and somatic dysfunction of cervical region: Secondary | ICD-10-CM | POA: Diagnosis not present

## 2022-10-04 DIAGNOSIS — M9902 Segmental and somatic dysfunction of thoracic region: Secondary | ICD-10-CM | POA: Diagnosis not present

## 2022-10-04 DIAGNOSIS — S134XXA Sprain of ligaments of cervical spine, initial encounter: Secondary | ICD-10-CM | POA: Diagnosis not present

## 2022-10-04 DIAGNOSIS — M9903 Segmental and somatic dysfunction of lumbar region: Secondary | ICD-10-CM | POA: Diagnosis not present

## 2022-10-10 ENCOUNTER — Ambulatory Visit (INDEPENDENT_AMBULATORY_CARE_PROVIDER_SITE_OTHER): Payer: Medicare HMO

## 2022-10-10 DIAGNOSIS — E538 Deficiency of other specified B group vitamins: Secondary | ICD-10-CM | POA: Diagnosis not present

## 2022-10-10 MED ORDER — CYANOCOBALAMIN 1000 MCG/ML IJ SOLN
1000.0000 ug | Freq: Once | INTRAMUSCULAR | Status: AC
Start: 2022-10-10 — End: 2022-10-10
  Administered 2022-10-10: 1000 ug via INTRAMUSCULAR

## 2022-10-10 NOTE — Progress Notes (Signed)
Pt here for monthly B12 injection per Dr Clent Ridges  B12 given IM and pt tolerated injection well.  Next B12 injection scheduled for 10/31/22

## 2022-10-18 DIAGNOSIS — M9902 Segmental and somatic dysfunction of thoracic region: Secondary | ICD-10-CM | POA: Diagnosis not present

## 2022-10-18 DIAGNOSIS — M9903 Segmental and somatic dysfunction of lumbar region: Secondary | ICD-10-CM | POA: Diagnosis not present

## 2022-10-18 DIAGNOSIS — M9901 Segmental and somatic dysfunction of cervical region: Secondary | ICD-10-CM | POA: Diagnosis not present

## 2022-10-18 DIAGNOSIS — S134XXA Sprain of ligaments of cervical spine, initial encounter: Secondary | ICD-10-CM | POA: Diagnosis not present

## 2022-10-22 ENCOUNTER — Other Ambulatory Visit: Payer: Self-pay | Admitting: Family Medicine

## 2022-10-31 ENCOUNTER — Ambulatory Visit (INDEPENDENT_AMBULATORY_CARE_PROVIDER_SITE_OTHER): Payer: Medicare HMO

## 2022-10-31 DIAGNOSIS — E538 Deficiency of other specified B group vitamins: Secondary | ICD-10-CM | POA: Diagnosis not present

## 2022-10-31 MED ORDER — CYANOCOBALAMIN 1000 MCG/ML IJ SOLN
1000.0000 ug | Freq: Once | INTRAMUSCULAR | Status: AC
  Administered 2022-10-31: 1000 ug via INTRAMUSCULAR

## 2022-10-31 NOTE — Progress Notes (Signed)
Per orders of Dr. Clent Ridges, injection of B12 given by Vickii Chafe. Patient tolerated injection well.

## 2022-11-02 ENCOUNTER — Telehealth: Payer: Self-pay | Admitting: Family Medicine

## 2022-11-02 NOTE — Telephone Encounter (Signed)
Asking for a recommendation for someone that does Gel shots for knee joints using ultrasound guidance

## 2022-11-04 NOTE — Telephone Encounter (Signed)
I don't know off hand, but I suggest he contact [facility] and ask them. I could then do a referral to them

## 2022-11-08 ENCOUNTER — Telehealth: Payer: Self-pay | Admitting: Family Medicine

## 2022-11-08 DIAGNOSIS — M179 Osteoarthritis of knee, unspecified: Secondary | ICD-10-CM | POA: Insufficient documentation

## 2022-11-08 DIAGNOSIS — M1711 Unilateral primary osteoarthritis, right knee: Secondary | ICD-10-CM

## 2022-11-08 NOTE — Telephone Encounter (Signed)
Pt needs a referral-- He gets "Synvisc" gel injections done once every 6 months.  He now has [employer]--the first time with this insurance company getting this done.  They are requiring a referral to come from Dr. Brigid Re them.   Referral for: Dr. Gaspar Bidding                     [address]                     [place], [place]

## 2022-11-08 NOTE — Telephone Encounter (Signed)
I did the referral 

## 2022-11-09 NOTE — Telephone Encounter (Signed)
Spoke with pt state that he her had an appointment with his Orthopedic Dr who advised for pt to start getting steroid injections every 3 months. Pt state that his insurance refuse to cover for the gel shots

## 2022-11-09 NOTE — Telephone Encounter (Signed)
Pt is aware.  

## 2022-11-15 DIAGNOSIS — M9901 Segmental and somatic dysfunction of cervical region: Secondary | ICD-10-CM | POA: Diagnosis not present

## 2022-11-15 DIAGNOSIS — S134XXA Sprain of ligaments of cervical spine, initial encounter: Secondary | ICD-10-CM | POA: Diagnosis not present

## 2022-11-15 DIAGNOSIS — M9902 Segmental and somatic dysfunction of thoracic region: Secondary | ICD-10-CM | POA: Diagnosis not present

## 2022-11-15 DIAGNOSIS — M9903 Segmental and somatic dysfunction of lumbar region: Secondary | ICD-10-CM | POA: Diagnosis not present

## 2022-11-16 DIAGNOSIS — M25561 Pain in right knee: Secondary | ICD-10-CM | POA: Diagnosis not present

## 2022-11-16 DIAGNOSIS — M1711 Unilateral primary osteoarthritis, right knee: Secondary | ICD-10-CM | POA: Diagnosis not present

## 2022-11-21 ENCOUNTER — Telehealth: Payer: Self-pay

## 2022-11-21 ENCOUNTER — Ambulatory Visit (INDEPENDENT_AMBULATORY_CARE_PROVIDER_SITE_OTHER): Payer: Medicare HMO

## 2022-11-21 VITALS — BP 130/60

## 2022-11-21 DIAGNOSIS — E538 Deficiency of other specified B group vitamins: Secondary | ICD-10-CM

## 2022-11-21 MED ORDER — CYANOCOBALAMIN 1000 MCG/ML IJ SOLN
1000.0000 ug | Freq: Once | INTRAMUSCULAR | Status: AC
  Administered 2022-11-21: 1000 ug via INTRAMUSCULAR

## 2022-11-21 NOTE — Telephone Encounter (Signed)
Pt was here for B12 injection and updates [place] about his R knee injection.   He states he is doing steriod injection every 3 months. Since his insurance doesn't cover gel injection anymore. Last injection was 11/16/2022. Was administer by Gaspar Bidding, DO. Sport medicine. Will be doing his injection with [facility]. Appt 02/12/2022

## 2022-11-21 NOTE — Progress Notes (Signed)
Per orders of Dr. Clent Ridges, injection of B12 given by Vickii Chafe on Left Deltoid. Patient tolerated injection well.

## 2022-12-09 ENCOUNTER — Other Ambulatory Visit: Payer: Self-pay | Admitting: Family Medicine

## 2022-12-13 ENCOUNTER — Ambulatory Visit (INDEPENDENT_AMBULATORY_CARE_PROVIDER_SITE_OTHER): Payer: Medicare HMO

## 2022-12-13 DIAGNOSIS — M9903 Segmental and somatic dysfunction of lumbar region: Secondary | ICD-10-CM | POA: Diagnosis not present

## 2022-12-13 DIAGNOSIS — E538 Deficiency of other specified B group vitamins: Secondary | ICD-10-CM

## 2022-12-13 DIAGNOSIS — M9901 Segmental and somatic dysfunction of cervical region: Secondary | ICD-10-CM | POA: Diagnosis not present

## 2022-12-13 DIAGNOSIS — S134XXA Sprain of ligaments of cervical spine, initial encounter: Secondary | ICD-10-CM | POA: Diagnosis not present

## 2022-12-13 DIAGNOSIS — M9902 Segmental and somatic dysfunction of thoracic region: Secondary | ICD-10-CM | POA: Diagnosis not present

## 2022-12-13 MED ORDER — CYANOCOBALAMIN 1000 MCG/ML IJ SOLN
1000.0000 ug | Freq: Once | INTRAMUSCULAR | Status: AC
  Administered 2022-12-13: 1000 ug via INTRAMUSCULAR

## 2022-12-13 NOTE — Progress Notes (Signed)
Per orders of Dr.Fry , injection of B12  given by Stann Ore. Patient tolerated injection well.

## 2023-01-03 ENCOUNTER — Ambulatory Visit (INDEPENDENT_AMBULATORY_CARE_PROVIDER_SITE_OTHER): Payer: Medicare HMO

## 2023-01-03 DIAGNOSIS — E538 Deficiency of other specified B group vitamins: Secondary | ICD-10-CM

## 2023-01-03 MED ORDER — CYANOCOBALAMIN 1000 MCG/ML IJ SOLN
1000.0000 ug | Freq: Once | INTRAMUSCULAR | Status: AC
  Administered 2023-01-03: 1000 ug via INTRAMUSCULAR

## 2023-01-03 NOTE — Progress Notes (Signed)
 Pt here for monthly B12 injection per Dr Clent Ridges  B12 given IM and pt tolerated injection well.  Next B12 injection scheduled for 01/24/23

## 2023-01-10 DIAGNOSIS — M9901 Segmental and somatic dysfunction of cervical region: Secondary | ICD-10-CM | POA: Diagnosis not present

## 2023-01-10 DIAGNOSIS — M9902 Segmental and somatic dysfunction of thoracic region: Secondary | ICD-10-CM | POA: Diagnosis not present

## 2023-01-10 DIAGNOSIS — S134XXA Sprain of ligaments of cervical spine, initial encounter: Secondary | ICD-10-CM | POA: Diagnosis not present

## 2023-01-10 DIAGNOSIS — M9903 Segmental and somatic dysfunction of lumbar region: Secondary | ICD-10-CM | POA: Diagnosis not present

## 2023-01-24 ENCOUNTER — Ambulatory Visit (INDEPENDENT_AMBULATORY_CARE_PROVIDER_SITE_OTHER): Payer: Medicare HMO

## 2023-01-24 ENCOUNTER — Ambulatory Visit: Payer: Medicare HMO

## 2023-01-24 DIAGNOSIS — E538 Deficiency of other specified B group vitamins: Secondary | ICD-10-CM | POA: Diagnosis not present

## 2023-01-24 MED ORDER — CYANOCOBALAMIN 1000 MCG/ML IJ SOLN
1000.0000 ug | Freq: Once | INTRAMUSCULAR | Status: AC
  Administered 2023-01-24: 1000 ug via INTRAMUSCULAR

## 2023-01-24 NOTE — Progress Notes (Signed)
Pt here for monthly B12 injection per Dr Clent Ridges  B12 given IM and pt tolerated injection well.  Next B12 injection scheduled for 02/15/23

## 2023-01-28 ENCOUNTER — Other Ambulatory Visit: Payer: Self-pay

## 2023-01-28 ENCOUNTER — Encounter (HOSPITAL_BASED_OUTPATIENT_CLINIC_OR_DEPARTMENT_OTHER): Payer: Self-pay | Admitting: Emergency Medicine

## 2023-01-28 ENCOUNTER — Emergency Department (HOSPITAL_BASED_OUTPATIENT_CLINIC_OR_DEPARTMENT_OTHER)
Admission: EM | Admit: 2023-01-28 | Discharge: 2023-01-28 | Disposition: A | Discharge status: Home / Self Care | Payer: Medicare HMO | Attending: Emergency Medicine | Admitting: Emergency Medicine

## 2023-01-28 DIAGNOSIS — R2231 Localized swelling, mass and lump, right upper limb: Secondary | ICD-10-CM | POA: Diagnosis present

## 2023-01-28 DIAGNOSIS — R591 Generalized enlarged lymph nodes: Secondary | ICD-10-CM | POA: Insufficient documentation

## 2023-01-28 DIAGNOSIS — R590 Localized enlarged lymph nodes: Secondary | ICD-10-CM | POA: Diagnosis not present

## 2023-01-28 DIAGNOSIS — R21 Rash and other nonspecific skin eruption: Secondary | ICD-10-CM | POA: Diagnosis not present

## 2023-01-28 LAB — BASIC METABOLIC PANEL
Anion gap: 9 (ref 5–15)
BUN: 15 mg/dL (ref 8–23)
CO2: 25 mmol/L (ref 22–32)
Calcium: 9 mg/dL (ref 8.9–10.3)
Chloride: 102 mmol/L (ref 98–111)
Creatinine, Ser: 0.88 mg/dL (ref 0.61–1.24)
GFR, Estimated: >60 mL/min (ref >60)
Glucose, Bld: 98 mg/dL (ref 70–99)
Potassium: 4.1 mmol/L (ref 3.5–5.1)
Sodium: 136 mmol/L (ref 135–145)

## 2023-01-28 LAB — CBC WITH DIFFERENTIAL/PLATELET
Abs Immature Granulocytes: 0.02 10*3/uL (ref 0.00–0.07)
Basophils Absolute: 0.1 10*3/uL (ref 0.0–0.1)
Basophils Relative: 1 %
Eosinophils Absolute: 0.2 10*3/uL (ref 0.0–0.5)
Eosinophils Relative: 3 %
HCT: 33.2 % — ABNORMAL LOW (ref 39.0–52.0)
Hemoglobin: 11.7 g/dL — ABNORMAL LOW (ref 13.0–17.0)
Immature Granulocytes: 0 %
Lymphocytes Relative: 26 %
Lymphs Abs: 1.5 10*3/uL (ref 0.7–4.0)
MCH: 30 pg (ref 26.0–34.0)
MCHC: 35.2 g/dL (ref 30.0–36.0)
MCV: 85.1 fL (ref 80.0–100.0)
Monocytes Absolute: 0.5 10*3/uL (ref 0.1–1.0)
Monocytes Relative: 9 %
Neutro Abs: 3.6 10*3/uL (ref 1.7–7.7)
Neutrophils Relative %: 61 %
Platelets: 158 10*3/uL (ref 150–400)
RBC: 3.9 MIL/uL — ABNORMAL LOW (ref 4.22–5.81)
RDW: 14.6 % (ref 11.5–15.5)
WBC: 5.9 10*3/uL (ref 4.0–10.5)
nRBC: 0 % (ref 0.0–0.2)

## 2023-01-28 MED ORDER — CLOTRIMAZOLE 1 % EX CREA: 1.0000 | TOPICAL_CREAM | Freq: Two times a day (BID) | CUTANEOUS | 0 refills | Status: AC

## 2023-01-28 MED ORDER — CEPHALEXIN 500 MG PO CAPS: 500.0000 mg | ORAL_CAPSULE | Freq: Four times a day (QID) | ORAL | 0 refills | Status: AC

## 2023-01-28 NOTE — Discharge Instructions (Addendum)
See your Physicain for recheck next week.

## 2023-01-28 NOTE — ED Provider Notes (Signed)
[place] EMERGENCY DEPARTMENT AT [facility] Provider Note   CSN: [id number] Arrival date & time: 01/28/23  1023     History  Chief Complaint  Patient presents with   Mass    Matthew House is a 83 y.o. male.  Pt is 83 yo M recovering from dental infection presenting to ER with ~20-month history of a lump in his right armpit. Pt states that ~ 1 month ago he noticed a lump in his right armpit and overtime has noticed increased redness, pain, and irritation in the area. Initially he attributed the irritation to his shirts being too tight. His wife suggested he come in because neither the lump or irritation is going away. Pt mentions he recently finished a course of Amoxicillin for dental infection and wonders if these issues are related. Pt denies any fever or chills.  No cough or congestion   The history is provided by the patient. No language interpreter was used.       Home Medications Prior to Admission medications   Medication Sig Start Date End Date Taking? Authorizing Provider  cephALEXin (KEFLEX) 500 MG capsule Take 1 capsule (500 mg total) by mouth 4 (four) times daily for 10 days. 01/28/23 02/07/23 Yes Elson Areas, PA-C  clotrimazole (LOTRIMIN AF) 1 % cream Apply 1 Application topically 2 (two) times daily. 01/28/23  Yes Cheron Schaumann K, PA-C  cetirizine (ZYRTEC) 10 MG tablet Take 10 mg by mouth daily.    [provider]  cyanocobalamin (,VITAMIN B-12,) 1000 MCG/ML injection Inject 1 mL (1,000 mcg total) into the muscle every 30 (thirty) days. 12/03/20   Nelwyn Salisbury, MD  diphenoxylate-atropine (LOMOTIL) 2.5-0.025 MG tablet Take 2 tablets by mouth 4 (four) times daily as needed for diarrhea or loose stools. 06/15/22   Nelwyn Salisbury, MD  famotidine (PEPCID) 40 MG tablet TAKE ONE TABLET BY MOUTH TWICE DAILY 12/09/22   Nelwyn Salisbury, MD  famotidine (QC FAMOTIDINE ACID REDUCER) 20 MG tablet TAKE TWO TABLETS BY MOUTH TWICE DAILY 01/12/22   Nelwyn Salisbury, MD   Ferrous Sulfate (IRON PO) Take 1 tablet by mouth in the morning.    [provider]  gabapentin (NEURONTIN) 300 MG capsule Take 1 capsule (300 mg total) by mouth at bedtime. 03/03/22   Nelwyn Salisbury, MD  Sodium Hyaluronate (DUROLANE) 60 MG/3ML PRSY intra-articular injection HCP to inject 3 mL, intra-articular once in the right knee 12/02/21     tamsulosin (FLOMAX) 0.4 MG CAPS capsule Take 1 capsule (0.4 mg total) by mouth daily. 09/09/22   Nelwyn Salisbury, MD      Allergies    Shingrix [zoster vac recomb adjuvanted]    Review of Systems   Review of Systems  Constitutional:  Negative for fever.  All other systems reviewed and are negative.   Physical Exam Updated Vital Signs BP (!) 152/64 (BP Location: Right Arm)   Pulse 71   Temp 97.6 F (36.4 C) (Oral)   Resp 16   Ht 5\' 11"  (1.803 m)   Wt 106.6 kg   SpO2 100%   BMI 32.78 kg/m  Physical Exam Vitals and nursing note reviewed.  Constitutional:      Appearance: He is well-developed.  HENT:     Head: Normocephalic.     Mouth/Throat:     Mouth: Mucous membranes are moist.  Cardiovascular:     Rate and Rhythm: Normal rate.  Pulmonary:     Effort: Pulmonary effort is normal.  Abdominal:     General: There is no distension.  Musculoskeletal:        General: Normal range of motion.     Cervical back: Normal range of motion.     Comments: Left axilla, discolored patch, scaly appearance approx 2cm,  pea size lymph node  Skin:    General: Skin is warm.  Neurological:     General: No focal deficit present.     Mental Status: He is alert and oriented to person, place, and time.     ED Results / Procedures / Treatments   Labs (all labs ordered are listed, but only abnormal results are displayed) Labs Reviewed  CBC WITH DIFFERENTIAL/PLATELET - Abnormal; Notable for the following components:      Result Value   RBC 3.90 (*)    Hemoglobin 11.7 (*)    HCT 33.2 (*)    All other components within normal limits  BASIC  METABOLIC PANEL    EKG None  Radiology No results found.  Procedures Procedures    Medications Ordered in ED Medications - No data to display  ED Course/ Medical Decision Making/ A&P                                 Medical Decision Making Pt reports a swollen area under his arm.  Pt reports dicolored area   Amount and/or Complexity of Data Reviewed Labs: ordered. Decision-making details documented in ED Course.    Details: Labs ordered reviewed and interpreted.  Cbc and bmet are normal   Risk Prescription drug management. Risk Details: Rash looks fungal I will try pt on lotrimin and keflex.  I advised him that he needs to have area rechecked.  He will schedule to see Dr. Clelia Croft next week for recheck            Final Clinical Impression(s) / ED Diagnoses Final diagnoses:  Rash  Lymphadenopathy    Rx / DC Orders ED Discharge Orders          Ordered    cephALEXin (KEFLEX) 500 MG capsule  4 times daily        01/28/23 1351    clotrimazole (LOTRIMIN AF) 1 % cream  2 times daily        01/28/23 1351           An After Visit Summary was printed and given to the patient.    Elson Areas, [place] 01/28/23 1611    Ernie Avena, MD 01/29/23 [phone]

## 2023-01-28 NOTE — ED Triage Notes (Signed)
Pt caox4 c/o pain and a lump to the R axilla x3 wks. Denies PMH of the same. Pt denies fever/chills or any additional complaints.

## 2023-01-30 ENCOUNTER — Encounter: Payer: Self-pay | Admitting: Family Medicine

## 2023-01-30 ENCOUNTER — Ambulatory Visit (INDEPENDENT_AMBULATORY_CARE_PROVIDER_SITE_OTHER): Payer: Medicare HMO | Admitting: Family Medicine

## 2023-01-30 VITALS — BP 128/60 | HR 81 | Temp 98.1°F | Wt 230.0 lb

## 2023-01-30 DIAGNOSIS — B354 Tinea corporis: Secondary | ICD-10-CM

## 2023-01-30 NOTE — Progress Notes (Signed)
   Subjective:    Patient ID: Matthew House, male    DOB: 1940/04/17, 83 y.o.   MRN: [id number]  HPI Here to follow up an ED visit on 01-28-23 for a tender rash in the right armpit. This had been present several days. On exam this was felt to be a fingal rash that had a secondary infection. There was also a question of an enlarged node in this area. His CBC showed a normal WBC at 5.9. He was treated with 7 days of Keflex and Clotrimazole cream. He says it already feels better.    Review of Systems  Constitutional: Negative.   Respiratory: Negative.    Cardiovascular: Negative.   Skin:  Positive for rash.       Objective:   Physical Exam Constitutional:      Appearance: Normal appearance.  Cardiovascular:     Rate and Rhythm: Normal rate and regular rhythm.     Pulses: Normal pulses.     Heart sounds: Normal heart sounds.  Pulmonary:     Effort: Pulmonary effort is normal.     Breath sounds: Normal breath sounds.  Skin:    Comments: There is a small area of erythema in the right axilla which is tender. There are no palpable lymph nodes   Neurological:     Mental Status: He is alert.           Assessment & Plan:  Tinea corporis. He will finish the antibiotic and follow up as needed.  Gershon Crane, MD

## 2023-02-01 DIAGNOSIS — L718 Other rosacea: Secondary | ICD-10-CM | POA: Diagnosis not present

## 2023-02-04 ENCOUNTER — Other Ambulatory Visit: Payer: Self-pay | Admitting: Family Medicine

## 2023-02-07 ENCOUNTER — Encounter: Payer: Medicare HMO | Admitting: Family Medicine

## 2023-02-07 DIAGNOSIS — M9902 Segmental and somatic dysfunction of thoracic region: Secondary | ICD-10-CM | POA: Diagnosis not present

## 2023-02-07 DIAGNOSIS — S134XXA Sprain of ligaments of cervical spine, initial encounter: Secondary | ICD-10-CM | POA: Diagnosis not present

## 2023-02-07 DIAGNOSIS — M9903 Segmental and somatic dysfunction of lumbar region: Secondary | ICD-10-CM | POA: Diagnosis not present

## 2023-02-07 DIAGNOSIS — M9901 Segmental and somatic dysfunction of cervical region: Secondary | ICD-10-CM | POA: Diagnosis not present

## 2023-02-15 ENCOUNTER — Ambulatory Visit: Payer: Medicare HMO

## 2023-02-15 DIAGNOSIS — E538 Deficiency of other specified B group vitamins: Secondary | ICD-10-CM | POA: Diagnosis not present

## 2023-02-15 MED ORDER — CYANOCOBALAMIN 1000 MCG/ML IJ SOLN
1000.0000 ug | Freq: Once | INTRAMUSCULAR | Status: AC
  Administered 2023-02-15: 1000 ug via INTRAMUSCULAR

## 2023-02-15 NOTE — Progress Notes (Signed)
Per orders of Dr. Fabian Sharp, injection of B12 given by Vickii Chafe on Right deltoid. Patient tolerated injection well.

## 2023-02-15 NOTE — Progress Notes (Unsigned)
   Rubin Payor, PhD, LAT, ATC acting as a scribe for Clementeen Graham, MD.  Matthew House is a 83 y.o. male who presents to [facility] today for R knee pain. Pt was previously seen by Dr. Berline Chough on 11/16/22.  Today, pt reports R knee pain returning ***  R Knee swelling: Mechanical symptoms: Aggravates: Treatments tried:  Pertinent review of systems: ***  Relevant historical information: ***   Exam:  There were no vitals taken for this visit. General: Well Developed, well nourished, and in no acute distress.   MSK: ***    Lab and Radiology Results No results found for this or any previous visit (from the past 72 hours). No results found.     Assessment and Plan: 83 y.o. male with ***   PDMP not reviewed this encounter. No orders of the defined types were placed in this encounter.  No orders of the defined types were placed in this encounter.    Discussed warning signs or symptoms. Please see discharge instructions. Patient expresses understanding.   ***

## 2023-02-16 ENCOUNTER — Other Ambulatory Visit: Payer: Self-pay

## 2023-02-16 ENCOUNTER — Encounter: Payer: Self-pay | Admitting: Family Medicine

## 2023-02-16 ENCOUNTER — Ambulatory Visit: Payer: Medicare HMO | Admitting: Family Medicine

## 2023-02-16 VITALS — BP 136/68 | HR 71 | Ht 71.0 in | Wt 230.0 lb

## 2023-02-16 DIAGNOSIS — M1711 Unilateral primary osteoarthritis, right knee: Secondary | ICD-10-CM

## 2023-02-16 DIAGNOSIS — G8929 Other chronic pain: Secondary | ICD-10-CM | POA: Diagnosis not present

## 2023-02-16 DIAGNOSIS — M25561 Pain in right knee: Secondary | ICD-10-CM

## 2023-02-16 NOTE — Patient Instructions (Signed)
Thank you for coming in today.  We are checking benefits for Zilretta injection for your knee

## 2023-02-24 ENCOUNTER — Encounter: Payer: Self-pay | Admitting: Family Medicine

## 2023-02-24 ENCOUNTER — Ambulatory Visit: Payer: Medicare HMO | Admitting: Family Medicine

## 2023-02-24 ENCOUNTER — Encounter: Payer: Medicare HMO | Admitting: Family Medicine

## 2023-02-24 VITALS — BP 126/70 | HR 63 | Temp 98.0°F | Ht 70.0 in | Wt 239.4 lb

## 2023-02-24 DIAGNOSIS — G629 Polyneuropathy, unspecified: Secondary | ICD-10-CM

## 2023-02-24 DIAGNOSIS — L719 Rosacea, unspecified: Secondary | ICD-10-CM

## 2023-02-24 DIAGNOSIS — K219 Gastro-esophageal reflux disease without esophagitis: Secondary | ICD-10-CM | POA: Diagnosis not present

## 2023-02-24 DIAGNOSIS — E538 Deficiency of other specified B group vitamins: Secondary | ICD-10-CM | POA: Diagnosis not present

## 2023-02-24 DIAGNOSIS — D649 Anemia, unspecified: Secondary | ICD-10-CM

## 2023-02-24 DIAGNOSIS — E785 Hyperlipidemia, unspecified: Secondary | ICD-10-CM

## 2023-02-24 DIAGNOSIS — R739 Hyperglycemia, unspecified: Secondary | ICD-10-CM

## 2023-02-24 DIAGNOSIS — N3942 Incontinence without sensory awareness: Secondary | ICD-10-CM

## 2023-02-24 DIAGNOSIS — C61 Malignant neoplasm of prostate: Secondary | ICD-10-CM | POA: Diagnosis not present

## 2023-02-24 LAB — HEMOGLOBIN A1C: Hgb A1c MFr Bld: 5.4 % (ref 4.6–6.5)

## 2023-02-24 LAB — CBC WITH DIFFERENTIAL/PLATELET
Basophils Absolute: 0 10*3/uL (ref 0.0–0.1)
Basophils Relative: 0.7 % (ref 0.0–3.0)
Eosinophils Absolute: 0.2 10*3/uL (ref 0.0–0.7)
Eosinophils Relative: 3 % (ref 0.0–5.0)
HCT: 34.6 % — ABNORMAL LOW (ref 39.0–52.0)
Hemoglobin: 12.1 g/dL — ABNORMAL LOW (ref 13.0–17.0)
Lymphocytes Relative: 27.2 % (ref 12.0–46.0)
Lymphs Abs: 1.9 10*3/uL (ref 0.7–4.0)
MCHC: 34.9 g/dL (ref 30.0–36.0)
MCV: 87.4 fL (ref 78.0–100.0)
Monocytes Absolute: 0.6 10*3/uL (ref 0.1–1.0)
Monocytes Relative: 9.4 % (ref 3.0–12.0)
Neutro Abs: 4.1 10*3/uL (ref 1.4–7.7)
Neutrophils Relative %: 59.7 % (ref 43.0–77.0)
Platelets: 188 10*3/uL (ref 150.0–400.0)
RBC: 3.95 Mil/uL — ABNORMAL LOW (ref 4.22–5.81)
RDW: 15 % (ref 11.5–15.5)
WBC: 6.8 10*3/uL (ref 4.0–10.5)

## 2023-02-24 LAB — HEPATIC FUNCTION PANEL
ALT: 22 U/L (ref 0–53)
AST: 16 U/L (ref 0–37)
Albumin: 4.7 g/dL (ref 3.5–5.2)
Alkaline Phosphatase: 59 U/L (ref 39–117)
Bilirubin, Direct: 0.2 mg/dL (ref 0.0–0.3)
Total Bilirubin: 1 mg/dL (ref 0.2–1.2)
Total Protein: 7.2 g/dL (ref 6.0–8.3)

## 2023-02-24 LAB — LIPID PANEL
Cholesterol: 140 mg/dL (ref 0–200)
HDL: 27.9 mg/dL — ABNORMAL LOW (ref >39.00)
LDL Cholesterol: 64 mg/dL (ref 0–99)
NonHDL: 112.01
Total CHOL/HDL Ratio: 5
Triglycerides: 242 mg/dL — ABNORMAL HIGH (ref 0.0–149.0)
VLDL: 48.4 mg/dL — ABNORMAL HIGH (ref 0.0–40.0)

## 2023-02-24 LAB — VITAMIN B12: Vitamin B-12: 875 pg/mL (ref 211–911)

## 2023-02-24 LAB — BASIC METABOLIC PANEL
BUN: 13 mg/dL (ref 6–23)
CO2: 29 meq/L (ref 19–32)
Calcium: 9.1 mg/dL (ref 8.4–10.5)
Chloride: 102 meq/L (ref 96–112)
Creatinine, Ser: 0.8 mg/dL (ref 0.40–1.50)
GFR: 82.9 mL/min (ref >60.00)
Glucose, Bld: 97 mg/dL (ref 70–99)
Potassium: 3.9 meq/L (ref 3.5–5.1)
Sodium: 139 meq/L (ref 135–145)

## 2023-02-24 LAB — PSA: PSA: 0 ng/mL — ABNORMAL LOW (ref 0.10–4.00)

## 2023-02-24 LAB — TSH: TSH: 2.37 u[IU]/mL (ref 0.35–5.50)

## 2023-02-24 MED ORDER — FAMOTIDINE 40 MG PO TABS: 40.0000 mg | ORAL_TABLET | Freq: Two times a day (BID) | ORAL | 3 refills | Status: AC

## 2023-02-24 NOTE — Progress Notes (Signed)
Subjective:    Patient ID: Matthew House, male    DOB: 10-02-1940, 83 y.o.   MRN: [id number]  HPI Here to follow up on issues. He feels well in general. His GERD is stable. He continues on monthly B12 shots. His neuropathy has not progressed at all (he never took the Gabapentin we suggested). He does have a tiny amount of urine leakage so he wears protective undergarments.    Review of Systems  Constitutional: Negative.   HENT: Negative.    Eyes: Negative.   Respiratory: Negative.    Cardiovascular: Negative.   Gastrointestinal: Negative.   Genitourinary: Negative.   Musculoskeletal: Negative.   Skin: Negative.   Neurological: Negative.   Psychiatric/Behavioral: Negative.         Objective:   Physical Exam Constitutional:      General: He is not in acute distress.    Appearance: Normal appearance. He is well-developed. He is not diaphoretic.  HENT:     Head: Normocephalic and atraumatic.     Right Ear: External ear normal.     Left Ear: External ear normal.     Nose: Nose normal.     Mouth/Throat:     Pharynx: No oropharyngeal exudate.  Eyes:     General: No scleral icterus.       Right eye: No discharge.        Left eye: No discharge.     Conjunctiva/sclera: Conjunctivae normal.     Pupils: Pupils are equal, round, and reactive to light.  Neck:     Thyroid: No thyromegaly.     Vascular: No JVD.     Trachea: No tracheal deviation.  Cardiovascular:     Rate and Rhythm: Normal rate and regular rhythm.     Pulses: Normal pulses.     Heart sounds: Normal heart sounds. No murmur heard.    No friction rub. No gallop.  Pulmonary:     Effort: Pulmonary effort is normal. No respiratory distress.     Breath sounds: Normal breath sounds. No wheezing or rales.  Chest:     Chest wall: No tenderness.  Abdominal:     General: Bowel sounds are normal. There is no distension.     Palpations: Abdomen is soft. There is no mass.     Tenderness: There is no abdominal  tenderness. There is no guarding or rebound.  Genitourinary:    Penis: Normal. No tenderness.      Testes: Normal.  Musculoskeletal:        General: No tenderness. Normal range of motion.     Cervical back: Neck supple.  Lymphadenopathy:     Cervical: No cervical adenopathy.  Skin:    General: Skin is warm and dry.     Coloration: Skin is not pale.     Findings: No erythema or rash.  Neurological:     General: No focal deficit present.     Mental Status: He is alert and oriented to person, place, and time.     Cranial Nerves: No cranial nerve deficit.     Motor: No abnormal muscle tone.     Coordination: Coordination normal.     Deep Tendon Reflexes: Reflexes are normal and symmetric. Reflexes normal.  Psychiatric:        Mood and Affect: Mood normal.        Behavior: Behavior normal.        Thought Content: Thought content normal.        Judgment: Judgment normal.  Assessment & Plan:  His GERD and neuropathy are stable. He has some urinary incontinence which does not seem to bother him. He will get fasting labs to check lipids, PSA, B12, etc. We spent a total of (34   ) minutes reviewing records and discussing these issues.  Gershon Crane, MD

## 2023-02-28 ENCOUNTER — Other Ambulatory Visit: Payer: Self-pay

## 2023-02-28 ENCOUNTER — Encounter: Payer: Self-pay | Admitting: Family Medicine

## 2023-02-28 ENCOUNTER — Ambulatory Visit: Payer: Medicare HMO | Admitting: Family Medicine

## 2023-02-28 DIAGNOSIS — M25561 Pain in right knee: Secondary | ICD-10-CM | POA: Diagnosis not present

## 2023-02-28 DIAGNOSIS — G8929 Other chronic pain: Secondary | ICD-10-CM | POA: Diagnosis not present

## 2023-02-28 DIAGNOSIS — M1711 Unilateral primary osteoarthritis, right knee: Secondary | ICD-10-CM

## 2023-02-28 MED ORDER — TRIAMCINOLONE ACETONIDE 32 MG IX SRER
32.0000 mg | Freq: Once | INTRA_ARTICULAR | Status: AC
  Administered 2023-02-28: 32 mg via INTRA_ARTICULAR

## 2023-02-28 NOTE — Patient Instructions (Signed)
 Thank you for coming in today.   You received an injection today. Seek immediate medical attention if the joint becomes red, extremely painful, or is oozing fluid.

## 2023-02-28 NOTE — Progress Notes (Signed)
  Zilretta injection right knee Procedure: Real-time Ultrasound Guided Injection of Right knee joint superior lateral patellar space Device: Philips Affiniti 50G Images permanently stored and available for review in PACS Verbal informed consent obtained.  Discussed risks and benefits of procedure. Warned about infection, hyperglycemia bleeding, damage to structures among others. Patient expresses understanding and agreement Time-out conducted.   Noted no overlying erythema, induration, or other signs of local infection.   Skin prepped in a sterile fashion.   Local anesthesia: Topical Ethyl chloride.   With sterile technique and under real time ultrasound guidance: Zilretta 32 mg injected into knee joint. Fluid seen entering the joint capsule.   Completed without difficulty   Advised to call if fevers/chills, erythema, induration, drainage, or persistent bleeding.   Images permanently stored and available for review in the ultrasound unit.  Impression: Technically successful ultrasound guided injection.  Lot number: [id number]

## 2023-03-01 DIAGNOSIS — M9902 Segmental and somatic dysfunction of thoracic region: Secondary | ICD-10-CM | POA: Diagnosis not present

## 2023-03-01 DIAGNOSIS — M9901 Segmental and somatic dysfunction of cervical region: Secondary | ICD-10-CM | POA: Diagnosis not present

## 2023-03-01 DIAGNOSIS — M9903 Segmental and somatic dysfunction of lumbar region: Secondary | ICD-10-CM | POA: Diagnosis not present

## 2023-03-01 DIAGNOSIS — S134XXA Sprain of ligaments of cervical spine, initial encounter: Secondary | ICD-10-CM | POA: Diagnosis not present

## 2023-03-02 DIAGNOSIS — R69 Illness, unspecified: Secondary | ICD-10-CM | POA: Diagnosis not present

## 2023-03-03 ENCOUNTER — Telehealth: Payer: Self-pay | Admitting: Family Medicine

## 2023-03-03 NOTE — Telephone Encounter (Signed)
 Pt states that he has been taking Famotidine for GERD Pt states that he has done  research about taking both Famotidine and B12 Injection, states that Famotidine interferes with absorption of Vit B, pt is taking B12 Injections every 3 weeks but is still fatigue and wants to know if he can start taking B12 every 2 weeks. Please advise

## 2023-03-03 NOTE — Telephone Encounter (Signed)
 Pt got a call from [place] about his lab work.  He was told to "Watch his Diet", however he wants to speak to [place] about what that means. Patient also states his iron was low so he wants to know what to do about that.  Patient gets B12 shot every 3 weeks, however he has been very tired a lot so wants to approve with Dr. Clent Ridges to get his B12 every 2 weeks.   Please give patient a call back.

## 2023-03-03 NOTE — Telephone Encounter (Signed)
 I agree, change his B12 shots to every 14 days

## 2023-03-06 NOTE — Addendum Note (Signed)
 Addended by: Johnella Moloney on: 03/06/2023 08:11 AM   Modules accepted: Orders

## 2023-03-06 NOTE — Telephone Encounter (Signed)
 Spoke with pt advised of Dr Clent Ridges advise verbalized understanding

## 2023-03-08 ENCOUNTER — Ambulatory Visit (INDEPENDENT_AMBULATORY_CARE_PROVIDER_SITE_OTHER): Payer: Medicare HMO

## 2023-03-08 DIAGNOSIS — E538 Deficiency of other specified B group vitamins: Secondary | ICD-10-CM | POA: Diagnosis not present

## 2023-03-08 MED ORDER — CYANOCOBALAMIN 1000 MCG/ML IJ SOLN
1000.0000 ug | Freq: Once | INTRAMUSCULAR | Status: AC
  Administered 2023-03-08: 1000 ug via INTRAMUSCULAR

## 2023-03-08 NOTE — Progress Notes (Signed)
 Pt here for monthly B12 injection per Dr Clent Ridges  B12 given IM and pt tolerated injection well.  Next B12 injection scheduled for 03/22/23

## 2023-03-13 DIAGNOSIS — M9901 Segmental and somatic dysfunction of cervical region: Secondary | ICD-10-CM | POA: Diagnosis not present

## 2023-03-13 DIAGNOSIS — M9903 Segmental and somatic dysfunction of lumbar region: Secondary | ICD-10-CM | POA: Diagnosis not present

## 2023-03-13 DIAGNOSIS — M9902 Segmental and somatic dysfunction of thoracic region: Secondary | ICD-10-CM | POA: Diagnosis not present

## 2023-03-13 DIAGNOSIS — S134XXA Sprain of ligaments of cervical spine, initial encounter: Secondary | ICD-10-CM | POA: Diagnosis not present

## 2023-03-21 ENCOUNTER — Ambulatory Visit (INDEPENDENT_AMBULATORY_CARE_PROVIDER_SITE_OTHER)

## 2023-03-21 ENCOUNTER — Ambulatory Visit: Admitting: Family Medicine

## 2023-03-21 ENCOUNTER — Other Ambulatory Visit: Payer: Self-pay

## 2023-03-21 VITALS — BP 156/76 | HR 79 | Ht 70.0 in

## 2023-03-21 DIAGNOSIS — M25511 Pain in right shoulder: Secondary | ICD-10-CM

## 2023-03-21 DIAGNOSIS — G8929 Other chronic pain: Secondary | ICD-10-CM

## 2023-03-21 DIAGNOSIS — M19011 Primary osteoarthritis, right shoulder: Secondary | ICD-10-CM | POA: Diagnosis not present

## 2023-03-21 DIAGNOSIS — M85811 Other specified disorders of bone density and structure, right shoulder: Secondary | ICD-10-CM | POA: Diagnosis not present

## 2023-03-21 DIAGNOSIS — M546 Pain in thoracic spine: Secondary | ICD-10-CM

## 2023-03-21 DIAGNOSIS — R079 Chest pain, unspecified: Secondary | ICD-10-CM | POA: Diagnosis not present
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
## 2023-03-21 NOTE — Patient Instructions (Addendum)
 Thank you for coming in today.   Please get an Xray today before you leave   I've referred you to Physical Therapy.  Let us know if you don't hear from them in one week.   Check back in 6 weeks

## 2023-03-22 ENCOUNTER — Ambulatory Visit (INDEPENDENT_AMBULATORY_CARE_PROVIDER_SITE_OTHER)

## 2023-03-22 DIAGNOSIS — E538 Deficiency of other specified B group vitamins: Secondary | ICD-10-CM | POA: Diagnosis not present

## 2023-03-22 MED ORDER — CYANOCOBALAMIN 1000 MCG/ML IJ SOLN
1000.0000 ug | Freq: Once | INTRAMUSCULAR | Status: AC
  Administered 2023-03-22: 1000 ug via INTRAMUSCULAR

## 2023-03-22 NOTE — Progress Notes (Signed)
 Pt here for monthly B12 injection per Dr Clent Ridges  B12 given IM and pt tolerated injection well.  Next B12 injection scheduled for 04/05/23

## 2023-03-29 ENCOUNTER — Ambulatory Visit: Admitting: Physical Therapy

## 2023-03-29 ENCOUNTER — Encounter: Payer: Self-pay | Admitting: Physical Therapy

## 2023-03-29 DIAGNOSIS — M25511 Pain in right shoulder: Secondary | ICD-10-CM | POA: Diagnosis not present
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
## 2023-03-31 ENCOUNTER — Ambulatory Visit: Admitting: Physical Therapy

## 2023-03-31 ENCOUNTER — Encounter: Payer: Self-pay | Admitting: Physical Therapy

## 2023-03-31 DIAGNOSIS — M25511 Pain in right shoulder: Secondary | ICD-10-CM
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
## 2023-04-01 ENCOUNTER — Encounter: Payer: Self-pay | Admitting: Physical Therapy

## 2023-04-03 DIAGNOSIS — H02229 Mechanical lagophthalmos unspecified eye, unspecified eyelid: Secondary | ICD-10-CM | POA: Diagnosis not present

## 2023-04-03 DIAGNOSIS — S0501XA Injury of conjunctiva and corneal abrasion without foreign body, right eye, initial encounter: Secondary | ICD-10-CM | POA: Diagnosis not present

## 2023-04-05 ENCOUNTER — Encounter: Payer: Self-pay | Admitting: Family Medicine

## 2023-04-05 ENCOUNTER — Ambulatory Visit (INDEPENDENT_AMBULATORY_CARE_PROVIDER_SITE_OTHER): Admitting: *Deleted

## 2023-04-05 DIAGNOSIS — E538 Deficiency of other specified B group vitamins: Secondary | ICD-10-CM | POA: Diagnosis not present

## 2023-04-05 DIAGNOSIS — S0501XD Injury of conjunctiva and corneal abrasion without foreign body, right eye, subsequent encounter: Secondary | ICD-10-CM | POA: Diagnosis not present

## 2023-04-05 DIAGNOSIS — H02229 Mechanical lagophthalmos unspecified eye, unspecified eyelid: Secondary | ICD-10-CM | POA: Diagnosis not present

## 2023-04-05 MED ORDER — CYANOCOBALAMIN 1000 MCG/ML IJ SOLN
1000.0000 ug | Freq: Once | INTRAMUSCULAR | Status: AC
Start: 2023-04-05 — End: 2023-04-05
  Administered 2023-04-05: 1000 ug via INTRAMUSCULAR

## 2023-04-05 NOTE — Progress Notes (Signed)
Right shoulder x-ray shows mild arthritis

## 2023-04-05 NOTE — Progress Notes (Signed)
 Chest x-ray looks okay to radiology.

## 2023-04-05 NOTE — Progress Notes (Signed)
 Per orders of Dr. Clent Ridges, injection of b12 given by Kern Reap. Patient tolerated injection well.

## 2023-04-19 ENCOUNTER — Ambulatory Visit (INDEPENDENT_AMBULATORY_CARE_PROVIDER_SITE_OTHER)

## 2023-04-19 DIAGNOSIS — E538 Deficiency of other specified B group vitamins: Secondary | ICD-10-CM | POA: Diagnosis not present

## 2023-04-19 MED ORDER — CYANOCOBALAMIN 1000 MCG/ML IJ SOLN
1000.0000 ug | Freq: Once | INTRAMUSCULAR | Status: AC
  Administered 2023-04-19: 1000 ug via INTRAMUSCULAR

## 2023-04-19 NOTE — Patient Instructions (Signed)
 Health Maintenance Due  Topic Date Due   Medicare Annual Wellness (AWV)  Never done       03/03/2022   10:39 AM 08/17/2021    9:05 AM 12/03/2020   10:35 AM  Depression screen PHQ 2/9  Decreased Interest 0 0 0  Down, Depressed, Hopeless 0 0 0  PHQ - 2 Score 0 0 0  Altered sleeping 0 0 0  Tired, decreased energy 0 1 0  Change in appetite 0 0 0  Feeling bad or failure about yourself  0 0 0  Trouble concentrating 0 0 0  Moving slowly or fidgety/restless 0 0 0  Suicidal thoughts 0 0 0  PHQ-9 Score 0 1 0  Difficult doing work/chores Not difficult at all Not difficult at all Not difficult at all

## 2023-04-19 NOTE — Progress Notes (Signed)
 Per orders of Donley Furth, MD, injection of B12 given in  right deltoid by Cosimo Diones. Patient tolerated injection well.  Lab Results  Component Value Date   [id number] 02/24/2023

## 2023-05-01 NOTE — Progress Notes (Unsigned)
   [name], PhD, LAT, ATC acting as a scribe for Garlan Juniper, MD.  Matthew House is a 83 y.o. male who presents to [facility] at [facility] today for f/u R axilla and thoracic periscapular pain . Pt was last seen by Dr. Alease Hunter on 03/21/23 and was referred to PT, completing 2 visits  Today, pt reports ***  Dx imaging: 03/21/23 R shoulder & chest XR  Pertinent review of systems: ***  Relevant historical information: ***   Exam:  There were no vitals taken for this visit. General: Well Developed, well nourished, and in no acute distress.   MSK: ***    Lab and Radiology Results No results found for this or any previous visit (from the past 72 hours). No results found.     Assessment and Plan: 83 y.o. male with ***   PDMP not reviewed this encounter. No orders of the defined types were placed in this encounter.  No orders of the defined types were placed in this encounter.    Discussed warning signs or symptoms. Please see discharge instructions. Patient expresses understanding.   ***

## 2023-05-02 ENCOUNTER — Ambulatory Visit: Admitting: Family Medicine

## 2023-05-02 ENCOUNTER — Encounter: Payer: Self-pay | Admitting: Family Medicine

## 2023-05-02 VITALS — BP 142/68 | HR 70 | Ht 70.0 in

## 2023-05-02 DIAGNOSIS — M546 Pain in thoracic spine: Secondary | ICD-10-CM

## 2023-05-02 DIAGNOSIS — M1711 Unilateral primary osteoarthritis, right knee: Secondary | ICD-10-CM

## 2023-05-02 DIAGNOSIS — M25511 Pain in right shoulder: Secondary | ICD-10-CM | POA: Diagnosis not present

## 2023-05-02 DIAGNOSIS — M25561 Pain in right knee: Secondary | ICD-10-CM | POA: Diagnosis not present

## 2023-05-02 DIAGNOSIS — G8929 Other chronic pain: Secondary | ICD-10-CM

## 2023-05-02 NOTE — Patient Instructions (Signed)
 Thank you for coming in today.   Let me know when that knee start to hurting again. We can do the zilretta  shot at the end of May. Let me know ahead of time.   You should hear from [place] about the knee brace.

## 2023-05-03 ENCOUNTER — Ambulatory Visit (INDEPENDENT_AMBULATORY_CARE_PROVIDER_SITE_OTHER)

## 2023-05-03 DIAGNOSIS — E538 Deficiency of other specified B group vitamins: Secondary | ICD-10-CM | POA: Diagnosis not present

## 2023-05-03 MED ORDER — CYANOCOBALAMIN 1000 MCG/ML IJ SOLN
1000.0000 ug | Freq: Once | INTRAMUSCULAR | Status: AC
Start: 2023-05-03 — End: 2023-05-03
  Administered 2023-05-03: 1000 ug via INTRAMUSCULAR

## 2023-05-03 NOTE — Progress Notes (Signed)
 Patient is in office today for a nurse visit for B12 Injection. Patient Injection was given in the  Left deltoid. Patient tolerated injection well.

## 2023-05-10 ENCOUNTER — Encounter (HOSPITAL_COMMUNITY): Payer: Self-pay

## 2023-05-10 DIAGNOSIS — M1711 Unilateral primary osteoarthritis, right knee: Secondary | ICD-10-CM | POA: Diagnosis not present

## 2023-05-17 ENCOUNTER — Ambulatory Visit (INDEPENDENT_AMBULATORY_CARE_PROVIDER_SITE_OTHER)

## 2023-05-17 DIAGNOSIS — E538 Deficiency of other specified B group vitamins: Secondary | ICD-10-CM | POA: Diagnosis not present

## 2023-05-17 MED ORDER — CYANOCOBALAMIN 1000 MCG/ML IJ SOLN
1000.0000 ug | Freq: Once | INTRAMUSCULAR | Status: AC
  Administered 2023-05-17: 1000 ug via INTRAMUSCULAR

## 2023-05-17 NOTE — Progress Notes (Signed)
 Pt here for monthly B12 injection per Dr Alyne Babinski  B12 1000mcg given IM and pt tolerated injection well.  Next B12 injection scheduled for 05/31/23

## 2023-05-23 DIAGNOSIS — J029 Acute pharyngitis, unspecified: Secondary | ICD-10-CM | POA: Diagnosis not present

## 2023-05-23 DIAGNOSIS — J069 Acute upper respiratory infection, unspecified: Secondary | ICD-10-CM | POA: Diagnosis not present

## 2023-05-26 ENCOUNTER — Telehealth: Payer: Self-pay

## 2023-05-26 ENCOUNTER — Ambulatory Visit: Admitting: Family Medicine

## 2023-05-26 ENCOUNTER — Ambulatory Visit: Payer: Self-pay

## 2023-05-26 ENCOUNTER — Telehealth: Payer: Self-pay | Admitting: Family Medicine

## 2023-05-26 ENCOUNTER — Encounter: Payer: Self-pay | Admitting: Family

## 2023-05-26 ENCOUNTER — Ambulatory Visit (INDEPENDENT_AMBULATORY_CARE_PROVIDER_SITE_OTHER): Admitting: Family

## 2023-05-26 VITALS — BP 140/60 | HR 90 | Ht 70.0 in

## 2023-05-26 VITALS — BP 169/72 | HR 71 | Temp 98.1°F | Ht 70.0 in | Wt 232.6 lb

## 2023-05-26 DIAGNOSIS — J069 Acute upper respiratory infection, unspecified: Secondary | ICD-10-CM

## 2023-05-26 DIAGNOSIS — M25561 Pain in right knee: Secondary | ICD-10-CM | POA: Diagnosis not present

## 2023-05-26 DIAGNOSIS — G8929 Other chronic pain: Secondary | ICD-10-CM

## 2023-05-26 DIAGNOSIS — M1711 Unilateral primary osteoarthritis, right knee: Secondary | ICD-10-CM

## 2023-05-26 MED ORDER — AZITHROMYCIN 250 MG PO TABS: ORAL_TABLET | ORAL | 0 refills | Status: AC

## 2023-05-26 MED ORDER — TRIAMCINOLONE ACETONIDE 32 MG IX SRER
32.0000 mg | Freq: Once | INTRA_ARTICULAR | Status: AC
  Administered 2023-05-26: 32 mg via INTRA_ARTICULAR

## 2023-05-26 NOTE — Progress Notes (Signed)
 Patient ID: Matthew House, male    DOB: 1940-02-09, 83 y.o.   MRN: [id number]  Chief Complaint  Patient presents with  . Cough    Pt c/o Sore throat on Monday, seen in UC on 5/20 which resolved. Pt was given injection. Cough with yellow mucus and night sweats on 5/21.   Discussed the use of AI scribe software for clinical note transcription with the patient, who gave verbal consent to proceed.  History of Present Illness Matthew House is an 83 year old male who presents with a persistent cough and sore throat.  He has a severe sore throat that began on Monday and received a steroid injection at urgent care on Tuesday. Despite this, he continues to have a 'loose and rattly' cough, which started possibly on Wednesday, producing yellow and creamy mucus. He uses Flonase for sinus congestion, initially twice daily and now once at night. He experiences night sweats, waking up hot and sweaty at 4 AM, and took two extra strength Tylenol  this morning, which alleviated the sensation of being hot.  Coughing exacerbates his urinary incontinence, which he attributes to prostate removal. He is concerned about the possibility of worsening symptoms, particularly the cough and mucus production. He is not on any cough medicine but uses lozenges for throat irritation. He has a history of adverse reactions to vaccines, including a severe reaction to a shingles vaccine, leading him to avoid vaccinations. No ear pain is present, and his ears were checked at urgent care.  Assessment & Plan Acute upper respiratory infection  Acute  upper respiratory infection with sore throat and productive cough w/yellow mucus. Discussed conditional antibiotic use if symptoms worsen, as viral infections do not respond to antibiotics. Lungs clear on exam. - Encourage adequate hydration, at least 2L qd. - Sending azithromycin (Z-Pak) to start Saturday or Sunday if symptoms are not improved or worsen, such as thicker, darker mucus,  or fever development. - Advise monitoring symptoms and initiate antibiotics if mucus becomes green or fever exceeds 100F. - Instruct to check temperature at home if feeling feverish. - Call office next week if sx are not improved after finishing abt.   Subjective:     Outpatient Medications Prior to Visit  Medication Sig Dispense Refill  . cetirizine (ZYRTEC) 10 MG tablet Take 10 mg by mouth daily.    . clotrimazole  (LOTRIMIN  AF) 1 % cream Apply 1 Application topically 2 (two) times daily. 30 g 0  . cyanocobalamin  (,VITAMIN B-12,) 1000 MCG/ML injection Inject 1 mL (1,000 mcg total) into the muscle every 30 (thirty) days. 1 mL 0  . diphenoxylate -atropine  (LOMOTIL ) 2.5-0.025 MG tablet Take 2 tablets by mouth 4 (four) times daily as needed for diarrhea or loose stools. 120 tablet 5  . famotidine  (PEPCID ) 40 MG tablet Take 1 tablet (40 mg total) by mouth 2 (two) times daily. 180 tablet 3  . Ferrous Sulfate (IRON PO) Take 1 tablet by mouth every other day.    . Probiotic Product (PROBIOTIC BLEND) CAPS Take by mouth. Natures Bounty OTC    . Sodium Hyaluronate (DUROLANE) 60 MG/3ML PRSY intra-articular injection HCP to inject 3 mL, intra-articular once in the right knee 3 mL 0  . tamsulosin  (FLOMAX ) 0.4 MG CAPS capsule Take 1 capsule (0.4 mg total) by mouth daily. (Patient not taking: Reported on 05/26/2023) 90 capsule 3   No facility-administered medications prior to visit.   Past Medical History:  Diagnosis Date  . BPH (benign prostatic hyperplasia)   .  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 GERD (gastroesophageal reflux disease)   . Hyperlipidemia   . Rosacea   . S/P tonsillectomy and adenoidectomy    Past Surgical History:  Procedure Laterality Date  . COLONOSCOPY  09/28/2010   per Dr. Grandville Lax, no polyps, repeat in 10 yrs   . INCISE AND DRAIN ABCESS  2002   right 3rd finger  . KNEE CARTILAGE SURGERY Right   . LYMPHADENECTOMY Bilateral 03/14/2018   Procedure:  LYMPHADENECTOMY;  Surgeon: Osborn Blaze, MD;  Location: [facility];  Service: Urology;  Laterality: Bilateral;  . ROBOT ASSISTED LAPAROSCOPIC RADICAL PROSTATECTOMY N/A 03/14/2018   Procedure: XI ROBOTIC ASSISTED LAPAROSCOPIC RADICAL PROSTATECTOMY;  Surgeon: Osborn Blaze, MD;  Location: [facility];  Service: Urology;  Laterality: N/A;  3 HRS  . TONSILLECTOMY AND ADENOIDECTOMY  1952  . VASECTOMY  1992   Allergies  Allergen Reactions  . Shingrix [Zoster Vac Recomb Adjuvanted]     Arm swelling and water  blisters under arm      Objective:    Physical Exam Vitals and nursing note reviewed.  Constitutional:      General: He is not in acute distress.    Appearance: Normal appearance. He is obese.  HENT:     Head: Normocephalic.     Right Ear: Tympanic membrane and ear canal normal.     Left Ear: Tympanic membrane and ear canal normal.     Nose:     Right Sinus: No maxillary sinus tenderness or frontal sinus tenderness.     Left Sinus: No maxillary sinus tenderness or frontal sinus tenderness.     Mouth/Throat:     Mouth: Mucous membranes are moist.     Pharynx: No pharyngeal swelling, oropharyngeal exudate, posterior oropharyngeal erythema or uvula swelling.     Tonsils: No tonsillar exudate or tonsillar abscesses.  Cardiovascular:     Rate and Rhythm: Normal rate and regular rhythm.  Pulmonary:     Effort: Pulmonary effort is normal.     Breath sounds: Normal breath sounds.  Musculoskeletal:        General: Normal range of motion.     Cervical back: Normal range of motion.  Lymphadenopathy:     Head:     Right side of head: No preauricular or posterior auricular adenopathy.     Left side of head: No preauricular or posterior auricular adenopathy.     Cervical: No cervical adenopathy.  Skin:    General: Skin is warm and dry.  Neurological:     Mental Status: He is alert and oriented to person, place, and time.  Psychiatric:        Mood and Affect: Mood normal.   BP (!) 169/72  (BP Location: Left Arm, Patient Position: Sitting, Cuff Size: Large)   Pulse 71   Temp 98.1 F (36.7 C) (Temporal)   Ht 5\' 10"  (1.778 m)   Wt 232 lb 9.6 oz (105.5 kg)   SpO2 99%   BMI 33.37 kg/m  Wt Readings from Last 3 Encounters:  05/26/23 232 lb 9.6 oz (105.5 kg)  02/24/23 239 lb 6.4 oz (108.6 kg)  02/16/23 230 lb (104.3 kg)      Versa Gore, NP

## 2023-05-26 NOTE — Telephone Encounter (Signed)
 Zilretta  given 05/26/23.

## 2023-05-26 NOTE — Telephone Encounter (Signed)
 Patient would like repeat Zilretta . Can we run for authorization?

## 2023-05-26 NOTE — Progress Notes (Signed)
   I, Leone Ralphs am a scribe for Dr. Garlan Juniper, MD.  Matthew House is a 83 y.o. male who presents to [facility] at [facility] today for f/u R knee, R shoulder, and back pain. Pt was last seen by Dr. Alease Hunter on 03/21/23 and was referred to PT. Last R knee Zilretta  injection, 02/28/23.  Today, pt reports that he needs help with the fitting of the brace that came to his house and not the office. Would like to do the Zilretta  injection today.   Dx imaging: 03/21/23 R shoulder & chest XR  01/28/23 Labs  05/05/22 R knee XR  Pertinent review of systems: No fevers or chills  Relevant historical information: GERD.  Prostate cancer.   Exam:  BP (!) 140/60   Pulse 90   Ht 5\' 10"  (1.778 m)   SpO2 98%   BMI 34.35 kg/m  General: Well Developed, well nourished, and in no acute distress.   MSK: Right knee mild effusion normal motion with crepitation.    Lab and Radiology Results   Zilretta  injection right knee Procedure: Real-time Ultrasound Guided Injection of right knee joint superior lateral patellar space Device: Philips Affiniti 50G Images permanently stored and available for review in PACS Verbal informed consent obtained.  Discussed risks and benefits of procedure. Warned about infection, hyperglycemia bleeding, damage to structures among others. Patient expresses understanding and agreement Time-out conducted.   Noted no overlying erythema, induration, or other signs of local infection.   Skin prepped in a sterile fashion.   Local anesthesia: Topical Ethyl chloride.   With sterile technique and under real time ultrasound guidance: Zilretta  32 mg injected into knee joint. Fluid seen entering the joint capsule.   Completed without difficulty   Advised to call if fevers/chills, erythema, induration, drainage, or persistent bleeding.   Images permanently stored and available for review in the ultrasound unit.  Impression: Technically successful ultrasound guided  injection.  Lot number: [id number]     Assessment and Plan: 83 y.o. male with right knee pain due to DJD.  Plan for Zilretta  injection today.  Patient somehow had his medial off loader knee brace mailed to his house.  I will contact the [employer] rep to have him scheduled for fitting of the knee brace.   PDMP not reviewed this encounter. Orders Placed This Encounter  Procedures   US  LIMITED JOINT SPACE STRUCTURES LOW RIGHT(NO LINKED CHARGES)    Reason for Exam (SYMPTOM  OR DIAGNOSIS REQUIRED):   knee pain    Preferred imaging location?:   [facility] ordered this encounter  Medications   Triamcinolone  Acetonide (ZILRETTA ) intra-articular injection 32 mg     Discussed warning signs or symptoms. Please see discharge instructions. Patient expresses understanding.   The above documentation has been reviewed and is accurate and complete Garlan Juniper, M.D.

## 2023-05-26 NOTE — Telephone Encounter (Signed)
*  See additional phone note from [place] for approval? This was originally run back in [place] with approval until August so not sure if this is still good to go or if it needs to be rerun?

## 2023-05-26 NOTE — Telephone Encounter (Signed)
 Zilretta  has been approved. Fax received notifying approval from 02/16/23 to 08/16/23. Copy is located in media. You may have to pay a deductible, copayment or coinsurance for the services. Please refer to your Evidence of Coverage to determine the benefits and limitations that may apply.  Reference number: [id number] Exp: 08/16/23

## 2023-05-26 NOTE — Patient Instructions (Addendum)
 Thank you for coming in today.   You received an injection today. Seek immediate medical attention if the joint becomes red, extremely painful, or is oozing fluid.   You should hear from [place], our [employer] rep, about scheduling a time to fit your brace.  Check back as needed

## 2023-05-31 ENCOUNTER — Ambulatory Visit (INDEPENDENT_AMBULATORY_CARE_PROVIDER_SITE_OTHER)

## 2023-05-31 DIAGNOSIS — E538 Deficiency of other specified B group vitamins: Secondary | ICD-10-CM | POA: Diagnosis not present

## 2023-05-31 MED ORDER — CYANOCOBALAMIN 1000 MCG/ML IJ SOLN
1000.0000 ug | Freq: Once | INTRAMUSCULAR | Status: AC
  Administered 2023-05-31: 1000 ug via INTRAMUSCULAR

## 2023-05-31 NOTE — Progress Notes (Signed)
 Patient is in office today for a nurse visit for B12 Injection. Patient Injection was given in the  Right deltoid. Patient tolerated injection well.

## 2023-06-14 ENCOUNTER — Ambulatory Visit: Admitting: *Deleted

## 2023-06-14 DIAGNOSIS — E538 Deficiency of other specified B group vitamins: Secondary | ICD-10-CM

## 2023-06-14 MED ORDER — CYANOCOBALAMIN 1000 MCG/ML IJ SOLN
1000.0000 ug | Freq: Once | INTRAMUSCULAR | Status: AC
  Administered 2023-06-14: 1000 ug via INTRAMUSCULAR

## 2023-06-14 NOTE — Progress Notes (Signed)
 Per orders of Dr. Clent Ridges, injection of b12 given by Kern Reap. Patient tolerated injection well.

## 2023-06-15 ENCOUNTER — Telehealth: Payer: Self-pay | Admitting: Family Medicine

## 2023-06-15 NOTE — Telephone Encounter (Signed)
 Patient came by the office with an approval letter from [facility] stating he has been approved for Zilretta  from 08/17/23-02/17/24. REF # [id number]  I have scanned this into documents.  Patient is scheduled for 08/29/23 and 12/05/23.

## 2023-06-15 NOTE — Telephone Encounter (Signed)
 Yes I have the rest of his stuff to scan in I will do that Friday as well.

## 2023-06-19 ENCOUNTER — Ambulatory Visit: Admitting: Family Medicine

## 2023-06-26 DIAGNOSIS — L821 Other seborrheic keratosis: Secondary | ICD-10-CM | POA: Diagnosis not present

## 2023-06-26 DIAGNOSIS — Z08 Encounter for follow-up examination after completed treatment for malignant neoplasm: Secondary | ICD-10-CM | POA: Diagnosis not present

## 2023-06-26 DIAGNOSIS — L814 Other melanin hyperpigmentation: Secondary | ICD-10-CM | POA: Diagnosis not present

## 2023-06-26 DIAGNOSIS — D1801 Hemangioma of skin and subcutaneous tissue: Secondary | ICD-10-CM | POA: Diagnosis not present

## 2023-06-26 DIAGNOSIS — Z85828 Personal history of other malignant neoplasm of skin: Secondary | ICD-10-CM | POA: Diagnosis not present

## 2023-06-28 ENCOUNTER — Ambulatory Visit (INDEPENDENT_AMBULATORY_CARE_PROVIDER_SITE_OTHER)

## 2023-06-28 DIAGNOSIS — E538 Deficiency of other specified B group vitamins: Secondary | ICD-10-CM

## 2023-06-28 MED ORDER — CYANOCOBALAMIN 1000 MCG/ML IJ SOLN
1000.0000 ug | Freq: Once | INTRAMUSCULAR | Status: AC
  Administered 2023-06-28: 1000 ug via INTRAMUSCULAR

## 2023-06-28 NOTE — Progress Notes (Signed)
 Pt here for monthly B12 injection per Dr Johnny  B12 1000mcg given IM and pt tolerated injection well.  Next B12 injection scheduled for 07/12/23

## 2023-07-04 DIAGNOSIS — H16223 Keratoconjunctivitis sicca, not specified as Sjogren's, bilateral: Secondary | ICD-10-CM | POA: Diagnosis not present

## 2023-07-04 DIAGNOSIS — H43823 Vitreomacular adhesion, bilateral: Secondary | ICD-10-CM | POA: Diagnosis not present

## 2023-07-04 DIAGNOSIS — H02229 Mechanical lagophthalmos unspecified eye, unspecified eyelid: Secondary | ICD-10-CM | POA: Diagnosis not present

## 2023-07-04 DIAGNOSIS — H57813 Brow ptosis, bilateral: Secondary | ICD-10-CM | POA: Diagnosis not present

## 2023-07-04 DIAGNOSIS — H524 Presbyopia: Secondary | ICD-10-CM | POA: Diagnosis not present

## 2023-07-04 DIAGNOSIS — H25813 Combined forms of age-related cataract, bilateral: Secondary | ICD-10-CM | POA: Diagnosis not present

## 2023-07-12 ENCOUNTER — Ambulatory Visit (INDEPENDENT_AMBULATORY_CARE_PROVIDER_SITE_OTHER): Admitting: *Deleted

## 2023-07-12 DIAGNOSIS — E538 Deficiency of other specified B group vitamins: Secondary | ICD-10-CM

## 2023-07-12 MED ORDER — CYANOCOBALAMIN 1000 MCG/ML IJ SOLN
1000.0000 ug | Freq: Once | INTRAMUSCULAR | Status: AC
  Administered 2023-07-12: 1000 ug via INTRAMUSCULAR

## 2023-07-12 NOTE — Progress Notes (Signed)
Per orders of Dr. Fry, injection of B12 given by Deyonna Fitzsimmons. Patient tolerated injection well. 

## 2023-07-19 DIAGNOSIS — S134XXA Sprain of ligaments of cervical spine, initial encounter: Secondary | ICD-10-CM | POA: Diagnosis not present

## 2023-07-19 DIAGNOSIS — M9901 Segmental and somatic dysfunction of cervical region: Secondary | ICD-10-CM | POA: Diagnosis not present

## 2023-07-19 DIAGNOSIS — M9903 Segmental and somatic dysfunction of lumbar region: Secondary | ICD-10-CM | POA: Diagnosis not present

## 2023-07-19 DIAGNOSIS — M9902 Segmental and somatic dysfunction of thoracic region: Secondary | ICD-10-CM | POA: Diagnosis not present

## 2023-07-20 ENCOUNTER — Telehealth: Payer: Self-pay | Admitting: Family Medicine

## 2023-07-20 ENCOUNTER — Telehealth: Payer: Self-pay | Admitting: *Deleted

## 2023-07-20 NOTE — Telephone Encounter (Signed)
 Copied from CRM [id number]. Topic: Clinical - Medical Advice >> Jul 20, 2023 12:56 PM [place] wrote: Reason for CRM: patient wanting to know if it is possible he has a UTI.SABRAwanting to now if the strong smell of urine is a sign of one. please call as soon as possible

## 2023-07-20 NOTE — Telephone Encounter (Signed)
 Patient said to let Matthew House know that he made an appt for Friday morning, 07/21/23, so she doesn't need to call him back.

## 2023-07-20 NOTE — Telephone Encounter (Signed)
 Asking should be stop taking tamsulosin  (FLOMAX ) 0.4 MG CAPS capsule as it causes him to get up every hour and a half or so each night.

## 2023-07-21 ENCOUNTER — Encounter: Payer: Self-pay | Admitting: Family Medicine

## 2023-07-21 ENCOUNTER — Ambulatory Visit (INDEPENDENT_AMBULATORY_CARE_PROVIDER_SITE_OTHER): Admitting: Family Medicine

## 2023-07-21 ENCOUNTER — Ambulatory Visit: Payer: Self-pay | Admitting: Family Medicine

## 2023-07-21 ENCOUNTER — Other Ambulatory Visit: Payer: Self-pay | Admitting: Family Medicine

## 2023-07-21 VITALS — BP 118/50 | HR 80 | Temp 97.8°F | Ht 70.0 in | Wt 229.0 lb

## 2023-07-21 DIAGNOSIS — D649 Anemia, unspecified: Secondary | ICD-10-CM | POA: Diagnosis not present

## 2023-07-21 DIAGNOSIS — N3281 Overactive bladder: Secondary | ICD-10-CM

## 2023-07-21 DIAGNOSIS — E538 Deficiency of other specified B group vitamins: Secondary | ICD-10-CM

## 2023-07-21 DIAGNOSIS — G4730 Sleep apnea, unspecified: Secondary | ICD-10-CM

## 2023-07-21 LAB — CBC WITH DIFFERENTIAL/PLATELET
Basophils Absolute: 0 K/uL (ref 0.0–0.1)
Basophils Relative: 0.5 % (ref 0.0–3.0)
Eosinophils Absolute: 0.2 K/uL (ref 0.0–0.7)
Eosinophils Relative: 2.7 % (ref 0.0–5.0)
HCT: 33.8 % — ABNORMAL LOW (ref 39.0–52.0)
Hemoglobin: 11.8 g/dL — ABNORMAL LOW (ref 13.0–17.0)
Lymphocytes Relative: 24.4 % (ref 12.0–46.0)
Lymphs Abs: 1.7 K/uL (ref 0.7–4.0)
MCHC: 34.9 g/dL (ref 30.0–36.0)
MCV: 86.7 fl (ref 78.0–100.0)
Monocytes Absolute: 0.6 K/uL (ref 0.1–1.0)
Monocytes Relative: 8.6 % (ref 3.0–12.0)
Neutro Abs: 4.4 K/uL (ref 1.4–7.7)
Neutrophils Relative %: 63.8 % (ref 43.0–77.0)
Platelets: 189 K/uL (ref 150.0–400.0)
RBC: 3.9 Mil/uL — ABNORMAL LOW (ref 4.22–5.81)
RDW: 15 % (ref 11.5–15.5)
WBC: 7 K/uL (ref 4.0–10.5)

## 2023-07-21 LAB — VITAMIN B12: Vitamin B-12: 1290 pg/mL — ABNORMAL HIGH (ref 211–911)

## 2023-07-21 LAB — BASIC METABOLIC PANEL WITH GFR
BUN: 13 mg/dL (ref 6–23)
CO2: 25 meq/L (ref 19–32)
Calcium: 9.4 mg/dL (ref 8.4–10.5)
Chloride: 104 meq/L (ref 96–112)
Creatinine, Ser: 0.8 mg/dL (ref 0.40–1.50)
GFR: 82.67 mL/min (ref >60.00)
Glucose, Bld: 121 mg/dL — ABNORMAL HIGH (ref 70–99)
Potassium: 3.9 meq/L (ref 3.5–5.1)
Sodium: 138 meq/L (ref 135–145)

## 2023-07-21 LAB — POC URINALSYSI DIPSTICK (AUTOMATED)
Bilirubin, UA: NEGATIVE
Blood, UA: NEGATIVE
Glucose, UA: NEGATIVE
Ketones, UA: NEGATIVE
Leukocytes, UA: NEGATIVE
Nitrite, UA: NEGATIVE
Protein, UA: POSITIVE — AB
Spec Grav, UA: 1.02 (ref 1.010–1.025)
Urobilinogen, UA: 0.2 U/dL
pH, UA: 6 (ref 5.0–8.0)

## 2023-07-21 LAB — TSH: TSH: 2.95 u[IU]/mL (ref 0.35–5.50)

## 2023-07-21 MED ORDER — IRON (FERROUS SULFATE) 325 (65 FE) MG PO TABS
325.0000 mg | ORAL_TABLET | Freq: Every day | ORAL | 3 refills | Status: AC
Start: 2023-07-21 — End: ?

## 2023-07-21 MED ORDER — OXYBUTYNIN CHLORIDE ER 10 MG PO TB24: 10.0000 mg | ORAL_TABLET | Freq: Every day | ORAL | 5 refills | Status: DC

## 2023-07-21 NOTE — Telephone Encounter (Signed)
 He was seen in the clinic today.

## 2023-07-21 NOTE — Telephone Encounter (Signed)
 Noted

## 2023-07-21 NOTE — Addendum Note (Signed)
 Addended by: LADONNA INOCENTE SAILOR on: 07/21/2023 10:30 AM   Modules accepted: Orders

## 2023-07-21 NOTE — Addendum Note (Signed)
 Addended by: JOHNNY SENIOR A on: 07/21/2023 01:12 PM   Modules accepted: Orders

## 2023-07-21 NOTE — Progress Notes (Signed)
   Subjective:    Patient ID: Matthew House, male    DOB: 11/06/1940, 83 y.o.   MRN: [id number]  HPI Here for several issues. First he has been very tired the past few months. He says he gets up frequently at night to urinate. He also urinates frequently in the daytime. No burning. His wife has told him for years that he snores loudly. We are treating him for B12 deficiency as well as mild anemia.    Review of Systems  Constitutional:  Positive for fatigue.  Respiratory: Negative.    Cardiovascular: Negative.   Gastrointestinal: Negative.   Genitourinary:  Positive for frequency. Negative for difficulty urinating, dysuria, hematuria and urgency.  Neurological: Negative.        Objective:   Physical Exam Constitutional:      Appearance: Normal appearance.  Cardiovascular:     Rate and Rhythm: Normal rate and regular rhythm.     Pulses: Normal pulses.     Heart sounds: Normal heart sounds.  Pulmonary:     Effort: Pulmonary effort is normal.     Breath sounds: Normal breath sounds.  Neurological:     Mental Status: He is alert.           Assessment & Plan:  He has OAB so we will start him on Oxybutynin  XL 10 mg at bedtime. There is a good chance he has sleep apnea, spo we will refer him to Pulmonary to evaluate. Get labs today including a CBC and B12. Garnette Olmsted, MD

## 2023-07-25 ENCOUNTER — Ambulatory Visit

## 2023-07-26 DIAGNOSIS — S134XXA Sprain of ligaments of cervical spine, initial encounter: Secondary | ICD-10-CM | POA: Diagnosis not present

## 2023-07-26 DIAGNOSIS — M9903 Segmental and somatic dysfunction of lumbar region: Secondary | ICD-10-CM | POA: Diagnosis not present

## 2023-07-26 DIAGNOSIS — M9902 Segmental and somatic dysfunction of thoracic region: Secondary | ICD-10-CM | POA: Diagnosis not present

## 2023-07-26 DIAGNOSIS — M9901 Segmental and somatic dysfunction of cervical region: Secondary | ICD-10-CM | POA: Diagnosis not present

## 2023-07-28 ENCOUNTER — Ambulatory Visit: Admitting: Adult Health

## 2023-08-02 DIAGNOSIS — M9902 Segmental and somatic dysfunction of thoracic region: Secondary | ICD-10-CM | POA: Diagnosis not present

## 2023-08-02 DIAGNOSIS — M9901 Segmental and somatic dysfunction of cervical region: Secondary | ICD-10-CM | POA: Diagnosis not present

## 2023-08-02 DIAGNOSIS — S134XXA Sprain of ligaments of cervical spine, initial encounter: Secondary | ICD-10-CM | POA: Diagnosis not present

## 2023-08-02 DIAGNOSIS — M9903 Segmental and somatic dysfunction of lumbar region: Secondary | ICD-10-CM | POA: Diagnosis not present

## 2023-08-09 DIAGNOSIS — M9902 Segmental and somatic dysfunction of thoracic region: Secondary | ICD-10-CM | POA: Diagnosis not present

## 2023-08-09 DIAGNOSIS — S134XXA Sprain of ligaments of cervical spine, initial encounter: Secondary | ICD-10-CM | POA: Diagnosis not present

## 2023-08-09 DIAGNOSIS — M9901 Segmental and somatic dysfunction of cervical region: Secondary | ICD-10-CM | POA: Diagnosis not present

## 2023-08-09 DIAGNOSIS — M9903 Segmental and somatic dysfunction of lumbar region: Secondary | ICD-10-CM | POA: Diagnosis not present

## 2023-08-23 NOTE — Progress Notes (Unsigned)
   Matthew Ileana Collet, PhD, LAT, ATC acting as a scribe for Matthew Lloyd, Matthew House.  Matthew House is a 83 y.o. male who presents to [facility] at [facility] today for exacerbation of his R knee pain. Pt was last seen by Dr. Lloyd on 05/26/23 and his R knee was injected w/ Zilretta .  Today, pt reports ***  Dx imaging: 05/05/22 R knee XR   Pertinent review of systems: ***  Relevant historical information: ***   Exam:  There were no vitals taken for this visit. General: Well Developed, well nourished, and in no acute distress.   MSK: ***    Lab and Radiology Results No results found for this or any previous visit (from the past 72 hours). No results found.     Assessment and Plan: 83 y.o. male with ***   PDMP not reviewed this encounter. No orders of the defined types were placed in this encounter.  No orders of the defined types were placed in this encounter.    Discussed warning signs or symptoms. Please see discharge instructions. Patient expresses understanding.   ***

## 2023-08-25 ENCOUNTER — Other Ambulatory Visit: Payer: Self-pay

## 2023-08-25 ENCOUNTER — Ambulatory Visit: Admitting: Family Medicine

## 2023-08-25 ENCOUNTER — Encounter: Payer: Self-pay | Admitting: Family Medicine

## 2023-08-25 VITALS — BP 140/72 | HR 81 | Ht 70.0 in | Wt 238.0 lb

## 2023-08-25 DIAGNOSIS — M25561 Pain in right knee: Secondary | ICD-10-CM | POA: Diagnosis not present

## 2023-08-25 DIAGNOSIS — M25511 Pain in right shoulder: Secondary | ICD-10-CM | POA: Diagnosis not present

## 2023-08-25 DIAGNOSIS — G8929 Other chronic pain: Secondary | ICD-10-CM | POA: Diagnosis not present

## 2023-08-25 DIAGNOSIS — M1711 Unilateral primary osteoarthritis, right knee: Secondary | ICD-10-CM | POA: Diagnosis not present

## 2023-08-25 MED ORDER — TRIAMCINOLONE ACETONIDE 32 MG IX SRER
32.0000 mg | Freq: Once | INTRA_ARTICULAR | Status: AC
  Administered 2023-08-25: 32 mg via INTRA_ARTICULAR

## 2023-08-25 NOTE — Patient Instructions (Addendum)
 Thank you for coming in today.   You received an injection today. Seek immediate medical attention if the joint becomes red, extremely painful, or is oozing fluid.   Consider rechecking B12 levels again in 12 weeks.   Check back as needed

## 2023-08-29 ENCOUNTER — Ambulatory Visit: Admitting: Sports Medicine

## 2023-08-31 ENCOUNTER — Other Ambulatory Visit: Payer: Self-pay | Admitting: Family Medicine

## 2023-09-06 ENCOUNTER — Other Ambulatory Visit (INDEPENDENT_AMBULATORY_CARE_PROVIDER_SITE_OTHER)

## 2023-09-06 DIAGNOSIS — D649 Anemia, unspecified: Secondary | ICD-10-CM

## 2023-09-07 ENCOUNTER — Ambulatory Visit: Payer: Self-pay | Admitting: Family Medicine

## 2023-09-07 LAB — CBC WITH DIFFERENTIAL/PLATELET
Basophils Absolute: 0 K/uL (ref 0.0–0.1)
Basophils Relative: 0.6 % (ref 0.0–3.0)
Eosinophils Absolute: 0.2 K/uL (ref 0.0–0.7)
Eosinophils Relative: 2 % (ref 0.0–5.0)
HCT: 33.4 % — ABNORMAL LOW (ref 39.0–52.0)
Hemoglobin: 11.6 g/dL — ABNORMAL LOW (ref 13.0–17.0)
Lymphocytes Relative: 22.7 % (ref 12.0–46.0)
Lymphs Abs: 1.8 K/uL (ref 0.7–4.0)
MCHC: 34.7 g/dL (ref 30.0–36.0)
MCV: 87.6 fl (ref 78.0–100.0)
Monocytes Absolute: 0.6 K/uL (ref 0.1–1.0)
Monocytes Relative: 7.9 % (ref 3.0–12.0)
Neutro Abs: 5.3 K/uL (ref 1.4–7.7)
Neutrophils Relative %: 66.8 % (ref 43.0–77.0)
Platelets: 195 K/uL (ref 150.0–400.0)
RBC: 3.81 Mil/uL — ABNORMAL LOW (ref 4.22–5.81)
RDW: 15.1 % (ref 11.5–15.5)
WBC: 8 K/uL (ref 4.0–10.5)

## 2023-09-07 NOTE — Addendum Note (Signed)
 Addended by: JOHNNY SENIOR A on: 09/07/2023 08:57 AM   Modules accepted: Orders

## 2023-09-12 ENCOUNTER — Telehealth: Payer: Self-pay | Admitting: Family Medicine

## 2023-09-12 NOTE — Telephone Encounter (Signed)
 I spoke to Mr. Matthew House per consulting with Matthew House.  Mr. Matthew House stated he still had some questions and wanted to speak with Matthew House.  Matthew House had gone into a [employer] so patient requested Matthew House to call him back.    Please call pt back today, Tuesday 09/12/23, after 2 PM.

## 2023-09-14 NOTE — Telephone Encounter (Signed)
This encounter has been resolved 

## 2023-10-02 ENCOUNTER — Telehealth: Payer: Self-pay | Admitting: Family Medicine

## 2023-10-02 NOTE — Telephone Encounter (Signed)
 Last shoulder injection 08/25/23 Next shoulder injection scheduled 11/15/23.   Injections are almost 12 weeks apart for the shoulder, should be good to go.   Zilretta  for the knee must be 12 weeks 1 day between injections.

## 2023-10-02 NOTE — Telephone Encounter (Signed)
 Patient came in and would like to schedule to get his shoulder injection prior to the knee injection. He does not want to get them on the same day again. He has scheduled for the shoulder injection but would like to make sure that will not be too soon for insurance for when it is scheduled. Please advise.

## 2023-10-19 DIAGNOSIS — D485 Neoplasm of uncertain behavior of skin: Secondary | ICD-10-CM | POA: Diagnosis not present

## 2023-10-23 DIAGNOSIS — L720 Epidermal cyst: Secondary | ICD-10-CM | POA: Diagnosis not present

## 2023-10-23 DIAGNOSIS — D485 Neoplasm of uncertain behavior of skin: Secondary | ICD-10-CM | POA: Diagnosis not present

## 2023-10-23 DIAGNOSIS — L570 Actinic keratosis: Secondary | ICD-10-CM | POA: Diagnosis not present

## 2023-10-23 DIAGNOSIS — L820 Inflamed seborrheic keratosis: Secondary | ICD-10-CM | POA: Diagnosis not present

## 2023-11-06 ENCOUNTER — Ambulatory Visit: Admitting: Family Medicine

## 2023-11-15 ENCOUNTER — Other Ambulatory Visit: Payer: Self-pay

## 2023-11-15 ENCOUNTER — Telehealth: Payer: Self-pay

## 2023-11-15 ENCOUNTER — Ambulatory Visit: Admitting: Family Medicine

## 2023-11-15 VITALS — BP 148/82 | HR 69 | Ht 70.0 in | Wt 239.0 lb

## 2023-11-15 DIAGNOSIS — M25561 Pain in right knee: Secondary | ICD-10-CM | POA: Diagnosis not present

## 2023-11-15 DIAGNOSIS — M25511 Pain in right shoulder: Secondary | ICD-10-CM | POA: Diagnosis not present

## 2023-11-15 DIAGNOSIS — G8929 Other chronic pain: Secondary | ICD-10-CM

## 2023-11-15 DIAGNOSIS — M1711 Unilateral primary osteoarthritis, right knee: Secondary | ICD-10-CM

## 2023-11-15 NOTE — Progress Notes (Signed)
   Matthew Ileana Collet, PhD, LAT, ATC acting as a scribe for Artist Lloyd, MD.  Matthew House is a 83 y.o. male who presents to [facility] at [facility] today for exacerbation of his R shoulder pain. Pt was last seen by Dr. Lloyd on 08/25/23 and was given a R GH steroid injection.  Today, pt reports R shoulder pain returned about 1.5 wk ago. He thinks pain was exacerbation when his dog pulled hard on the leash.   Dx imaging: 03/21/23 R shoulder XR  Pertinent review of systems: No fevers or chills  Relevant historical information: History of prostate cancer Knee arthritis   Exam:  BP (!) 148/82   Pulse 69   Ht 5' 10 (1.778 m)   Wt 239 lb (108.4 kg)   SpO2 97%   BMI 34.29 kg/m  General: Well Developed, well nourished, and in no acute distress.   MSK: Right shoulder normal-appearing decreased range of motion intact strength.    Lab and Radiology Results  Procedure: Real-time Ultrasound Guided Injection of right shoulder glenohumeral joint posterior approach Device: Philips Affiniti 50G/GE Logiq Images permanently stored and available for review in PACS Verbal informed consent obtained.  Discussed risks and benefits of procedure. Warned about infection, bleeding, hyperglycemia damage to structures among others. Patient expresses understanding and agreement Time-out conducted.   Noted no overlying erythema, induration, or other signs of local infection.   Skin prepped in a sterile fashion.   Local anesthesia: Topical Ethyl chloride.   With sterile technique and under real time ultrasound guidance: 40 mg of Kenalog  and 2 mL of Marcaine  injected into shoulder joint. Fluid seen entering the joint capsule.   Completed without difficulty   Pain immediately resolved suggesting accurate placement of the medication.   Advised to call if fevers/chills, erythema, induration, drainage, or persistent bleeding.   Images permanently stored and available for review in the  ultrasound unit.  Impression: Technically successful ultrasound guided injection.       Assessment and Plan: 83 y.o. male with right shoulder pain due to DJD.  Plan for glenohumeral injection.  Can repeat this injection every 3 months.  Additionally [name] is having right knee pain due to DJD.  He had Zilretta  injection at the end of August.  He will be eligible for repeat Zilretta  injection in the near future.  Will work on authorization for that now.   PDMP not reviewed this encounter. Orders Placed This Encounter  Procedures   US  LIMITED JOINT SPACE STRUCTURES UP RIGHT(NO LINKED CHARGES)    Reason for Exam (SYMPTOM  OR DIAGNOSIS REQUIRED):   right shoulder pain    Preferred imaging location?:   [facility]   No orders of the defined types were placed in this encounter.    Discussed warning signs or symptoms. Please see discharge instructions. Patient expresses understanding.   The above documentation has been reviewed and is accurate and complete Artist Lloyd, M.D.

## 2023-11-15 NOTE — Patient Instructions (Addendum)
 Thank you for coming in today.   You received an injection today. Seek immediate medical attention if the joint becomes red, extremely painful, or is oozing fluid.  . You should hear soon about scheduling Zilretta 

## 2023-11-15 NOTE — Telephone Encounter (Signed)
-----   Message from Artist Lloyd sent at 11/15/2023 10:25 AM EST ----- Regarding: Zilretta  Please auth Zilretta  injection and schedule patient

## 2023-11-15 NOTE — Telephone Encounter (Signed)
 Ran benefits for zilretta  right knee case number [id number]

## 2023-11-16 NOTE — Telephone Encounter (Signed)
 Had to do a pre cert through [employer] and upload clinicals.  Cert: [id number]

## 2023-11-24 NOTE — Telephone Encounter (Signed)
 Noted

## 2023-11-24 NOTE — Telephone Encounter (Signed)
 Scheduled 12/07/23  Zilretta  authorized for right knee PRE CERT REQUIRED  Copay $15 Deductible does not apply OOP MAX $6750 has met $900.43 Once OOP has been met copay will no longer apply Auth Number [id number] 08/17/23-02/17/24

## 2023-12-05 ENCOUNTER — Ambulatory Visit: Admitting: Family Medicine

## 2023-12-07 ENCOUNTER — Encounter: Payer: Self-pay | Admitting: Family Medicine

## 2023-12-07 ENCOUNTER — Ambulatory Visit: Admitting: Family Medicine

## 2023-12-07 ENCOUNTER — Other Ambulatory Visit: Payer: Self-pay

## 2023-12-07 VITALS — BP 150/70 | HR 78 | Ht 70.0 in | Wt 235.0 lb

## 2023-12-07 DIAGNOSIS — M1711 Unilateral primary osteoarthritis, right knee: Secondary | ICD-10-CM

## 2023-12-07 DIAGNOSIS — G8929 Other chronic pain: Secondary | ICD-10-CM | POA: Diagnosis not present

## 2023-12-07 DIAGNOSIS — M25561 Pain in right knee: Secondary | ICD-10-CM

## 2023-12-07 MED ORDER — TRIAMCINOLONE ACETONIDE 32 MG IX SRER
32.0000 mg | Freq: Once | INTRA_ARTICULAR | Status: AC
  Administered 2023-12-07: 32 mg via INTRA_ARTICULAR

## 2023-12-07 NOTE — Progress Notes (Signed)
   I, Leotis Batter, CMA acting as a scribe for Artist Lloyd, MD.  Matthew House is a 83 y.o. male who presents to [facility] at [facility] today for exacerbation of his R knee pain. Pt was last seen by Dr. Lloyd on 08/25/23 and his R knee was injected w/ Zilretta .   Today, pt reports significant improvement of right knee sx with last Zilretta  injection.   Dx imaging: 05/05/22 R knee XR   Pertinent review of systems: No fevers or chills  Relevant historical information: History of prostate cancer.   Exam:  BP (!) 150/70   Pulse 78   Ht 5' 10 (1.778 m)   Wt 235 lb (106.6 kg)   SpO2 100%   BMI 33.72 kg/m  General: Well Developed, well nourished, and in no acute distress.   MSK: Right knee normal-appearing normal motion.    Lab and Radiology Results   Zilretta  injection right knee Procedure: Real-time Ultrasound Guided Injection of right knee joint superior lateral patellar space Device: Philips Affiniti 50G Images permanently stored and available for review in PACS Verbal informed consent obtained.  Discussed risks and benefits of procedure. Warned about infection, hyperglycemia bleeding, damage to structures among others. Patient expresses understanding and agreement Time-out conducted.   Noted no overlying erythema, induration, or other signs of local infection.   Skin prepped in a sterile fashion.   Local anesthesia: Topical Ethyl chloride.   With sterile technique and under real time ultrasound guidance: Zilretta  32 mg injected into knee joint. Fluid seen entering the joint capsule.   Completed without difficulty   Advised to call if fevers/chills, erythema, induration, drainage, or persistent bleeding.   Images permanently stored and available for review in the ultrasound unit.  Impression: Technically successful ultrasound guided injection.       Assessment and Plan: 83 y.o. male with right knee pain due to exacerbation of DJD.  Patient has  had benefit with offloader knee brace.  Plan for Zilretta  injection today.  Check back as needed.   PDMP not reviewed this encounter. Orders Placed This Encounter  Procedures   US  LIMITED JOINT SPACE STRUCTURES LOW RIGHT(NO LINKED CHARGES)    Reason for Exam (SYMPTOM  OR DIAGNOSIS REQUIRED):   right knee pain    Preferred imaging location?:   [facility] ordered this encounter  Medications   Triamcinolone  Acetonide (ZILRETTA ) intra-articular injection 32 mg     Discussed warning signs or symptoms. Please see discharge instructions. Patient expresses understanding.   The above documentation has been reviewed and is accurate and complete Artist Lloyd, M.D.

## 2023-12-07 NOTE — Patient Instructions (Addendum)
 Thank you for coming in today.   You received an injection today. Seek immediate medical attention if the joint becomes red, extremely painful, or is oozing fluid.   See you back as needed.

## 2024-01-05 ENCOUNTER — Other Ambulatory Visit: Payer: Self-pay | Admitting: Family Medicine

## 2024-01-05 NOTE — Progress Notes (Unsigned)
 Patient walked into office without scheduled appointment. Patient stated he has left arm discomfort and wanted his blood pressure checked. This RN was notified of patient. Patient brought to exam room for evaluation. Onset of Left Arm Discomfort this morning when he first woke up. Discomfort has been constant. Patient states the pain feels like pressure and numbness. Patient has taken all prescribed medications this morning. BP 150/72, HR 64. Full neuro evaluation completed. Patient alert and oriented x3. No gait abnormalities. No visual impairments. Grip strength equal bilaterally. No chest pain or SOB. Patient smile and grimace normal. Patient states when pressure applied to deltoid region he had some discomfort. Patient states he works out every Monday, Wednesday and Friday but has not worked out today. Advised patient is symptoms continue to make appt with Dr. Johnny. Advised patient if he develops Chest pain, SOB, dizziness to go to ED. Patient voiced understanding. Dr. Johnny notified of patient evaluation and complaint.

## 2024-01-15 ENCOUNTER — Other Ambulatory Visit: Payer: Self-pay | Admitting: Family Medicine

## 2024-01-19 ENCOUNTER — Other Ambulatory Visit (HOSPITAL_COMMUNITY): Payer: Self-pay

## 2024-01-19 ENCOUNTER — Telehealth: Payer: Self-pay

## 2024-01-19 NOTE — Telephone Encounter (Signed)
 Pharmacy Patient Advocate Encounter   Received notification from [facility] that prior authorization for Lomotil  is required/requested.   Insurance verification completed.   The patient is insured through [employer].   Per test claim: The current 15 day co-pay is, $20.82.  No PA needed at this time. This test claim was processed through [facility]- copay amounts may vary at other pharmacies due to pharmacy/plan contracts, or as the patient moves through the different stages of their insurance plan.

## 2024-01-24 NOTE — Telephone Encounter (Signed)
 Contacted pharmacy to update on PA below and spoke to [place]. She states pt had pick on 01/11/2024 and pay with cash but will run it when pt refill.

## 2024-02-02 ENCOUNTER — Ambulatory Visit (INDEPENDENT_AMBULATORY_CARE_PROVIDER_SITE_OTHER): Admitting: Family Medicine

## 2024-02-02 ENCOUNTER — Encounter: Payer: Self-pay | Admitting: Family Medicine

## 2024-02-02 VITALS — BP 136/78 | HR 87 | Temp 97.6°F | Wt 235.0 lb

## 2024-02-02 DIAGNOSIS — D649 Anemia, unspecified: Secondary | ICD-10-CM | POA: Insufficient documentation

## 2024-02-02 DIAGNOSIS — E538 Deficiency of other specified B group vitamins: Secondary | ICD-10-CM

## 2024-02-02 DIAGNOSIS — N390 Urinary tract infection, site not specified: Secondary | ICD-10-CM | POA: Diagnosis not present

## 2024-02-02 MED ORDER — CYANOCOBALAMIN 1000 MCG/ML IJ SOLN
1000.0000 ug | Freq: Once | INTRAMUSCULAR | Status: AC
  Administered 2024-02-02: 1000 ug via INTRAMUSCULAR

## 2024-02-02 NOTE — Progress Notes (Signed)
" ° °  Subjective:    Patient ID: Matthew House, male    DOB: 24-Oct-1940, 84 y.o.   MRN: [id number]  HPI Here to follow up on an urgent care visit yesterday. He presented with a week of increased urinary frequency and a foul smell to the urine. His UA was positive, so he was diagnosed with a UTI. The sample was sent for a culture, and he was started on 7 days pf Cephalexin . Today he feels about the same. They also did a number of blood tests and he would like to go over these results. These were all normal except for a low Hgb of 10.4 with an MCV of 87.6. He thinks his has dropped because she stopped getting B12 shots about 6 months ago. When he was taking the shots last September, his Hgb was 11.6. He has been feeling a generalized weakness for a few months.    Review of Systems  Constitutional:  Positive for fatigue. Negative for fever.  Respiratory: Negative.    Cardiovascular: Negative.   Gastrointestinal: Negative.   Genitourinary:  Positive for frequency and urgency.       Objective:   Physical Exam Constitutional:      Appearance: Normal appearance.  Cardiovascular:     Rate and Rhythm: Normal rate and regular rhythm.     Pulses: Normal pulses.  Pulmonary:     Effort: Pulmonary effort is normal.     Breath sounds: Normal breath sounds.  Abdominal:     Tenderness: There is no right CVA tenderness or left CVA tenderness.  Neurological:     Mental Status: He is alert.           Assessment & Plan:  He is being treated for a UTI. I advised him to finish the Cephalexin , and we will check the results of his urine culture next week. For the anemia, we will start him back on B12 shots today, and we will plan to get back on a schedule of one shot every 2 weeks . Garnette Olmsted, MD   "

## 2024-02-02 NOTE — Addendum Note (Signed)
 Addended by: LADONNA INOCENTE SAILOR on: 02/02/2024 03:20 PM   Modules accepted: Orders

## 2024-02-16 ENCOUNTER — Ambulatory Visit

## 2024-02-16 ENCOUNTER — Ambulatory Visit: Admitting: Family Medicine

## 2024-02-27 ENCOUNTER — Encounter: Admitting: Family Medicine

## 2024-03-01 ENCOUNTER — Ambulatory Visit: Admitting: Family Medicine
